# Patient Record
Sex: Female | Born: 1970 | Race: White | Hispanic: No | Marital: Single | Smoking: Current every day smoker
Health system: Southern US, Community
[De-identification: ages and names within clinical notes are randomized; demographics above are authoritative.]

## PROBLEM LIST (undated history)

## (undated) DIAGNOSIS — D649 Anemia, unspecified: Secondary | ICD-10-CM

## (undated) DIAGNOSIS — F1010 Alcohol abuse, uncomplicated: Secondary | ICD-10-CM

## (undated) DIAGNOSIS — F1020 Alcohol dependence, uncomplicated: Secondary | ICD-10-CM

## (undated) DIAGNOSIS — F32A Depression, unspecified: Secondary | ICD-10-CM

## (undated) DIAGNOSIS — F329 Major depressive disorder, single episode, unspecified: Secondary | ICD-10-CM

## (undated) DIAGNOSIS — R188 Other ascites: Secondary | ICD-10-CM

## (undated) DIAGNOSIS — F410 Panic disorder [episodic paroxysmal anxiety] without agoraphobia: Secondary | ICD-10-CM

## (undated) DIAGNOSIS — K7460 Unspecified cirrhosis of liver: Secondary | ICD-10-CM

## (undated) DIAGNOSIS — K759 Inflammatory liver disease, unspecified: Secondary | ICD-10-CM

## (undated) DIAGNOSIS — F419 Anxiety disorder, unspecified: Secondary | ICD-10-CM

## (undated) DIAGNOSIS — Z8489 Family history of other specified conditions: Secondary | ICD-10-CM

## (undated) DIAGNOSIS — K8309 Other cholangitis: Secondary | ICD-10-CM

## (undated) HISTORY — PX: OTHER SURGICAL HISTORY: SHX169

## (undated) HISTORY — PX: ERCP: SHX60

---

## 1998-09-15 ENCOUNTER — Other Ambulatory Visit: Admission: RE | Admit: 1998-09-15 | Discharge: 1998-09-15 | Payer: Self-pay | Admitting: Dentistry

## 1999-07-28 ENCOUNTER — Encounter: Admission: RE | Admit: 1999-07-28 | Discharge: 1999-07-28 | Payer: Self-pay | Admitting: Internal Medicine

## 1999-07-28 ENCOUNTER — Encounter: Payer: Self-pay | Admitting: Internal Medicine

## 2001-07-28 ENCOUNTER — Inpatient Hospital Stay (HOSPITAL_COMMUNITY): Admission: EM | Admit: 2001-07-28 | Discharge: 2001-08-03 | Payer: Self-pay | Admitting: Emergency Medicine

## 2001-07-28 ENCOUNTER — Encounter: Payer: Self-pay | Admitting: Gastroenterology

## 2002-09-29 ENCOUNTER — Encounter: Payer: Self-pay | Admitting: *Deleted

## 2002-09-29 ENCOUNTER — Inpatient Hospital Stay (HOSPITAL_COMMUNITY): Admission: EM | Admit: 2002-09-29 | Discharge: 2002-10-04 | Payer: Self-pay | Admitting: *Deleted

## 2002-09-30 ENCOUNTER — Encounter: Payer: Self-pay | Admitting: *Deleted

## 2003-02-14 ENCOUNTER — Ambulatory Visit (HOSPITAL_COMMUNITY): Admission: RE | Admit: 2003-02-14 | Discharge: 2003-02-14 | Payer: Self-pay | Admitting: Obstetrics & Gynecology

## 2003-06-26 ENCOUNTER — Emergency Department (HOSPITAL_COMMUNITY): Admission: EM | Admit: 2003-06-26 | Discharge: 2003-06-27 | Payer: Self-pay | Admitting: Emergency Medicine

## 2003-07-09 ENCOUNTER — Emergency Department (HOSPITAL_COMMUNITY): Admission: EM | Admit: 2003-07-09 | Discharge: 2003-07-09 | Payer: Self-pay | Admitting: Family Medicine

## 2003-08-17 ENCOUNTER — Inpatient Hospital Stay (HOSPITAL_COMMUNITY): Admission: EM | Admit: 2003-08-17 | Discharge: 2003-08-19 | Payer: Self-pay | Admitting: Psychiatry

## 2003-10-07 ENCOUNTER — Inpatient Hospital Stay (HOSPITAL_COMMUNITY): Admission: RE | Admit: 2003-10-07 | Discharge: 2003-10-13 | Payer: Self-pay | Admitting: Psychiatry

## 2003-10-07 ENCOUNTER — Ambulatory Visit: Payer: Self-pay | Admitting: Psychiatry

## 2004-04-08 ENCOUNTER — Ambulatory Visit (HOSPITAL_COMMUNITY): Admission: RE | Admit: 2004-04-08 | Discharge: 2004-04-08 | Payer: Self-pay | Admitting: Internal Medicine

## 2004-06-07 ENCOUNTER — Ambulatory Visit (HOSPITAL_COMMUNITY): Admission: RE | Admit: 2004-06-07 | Discharge: 2004-06-07 | Payer: Self-pay | Admitting: *Deleted

## 2004-08-07 ENCOUNTER — Inpatient Hospital Stay (HOSPITAL_COMMUNITY): Admission: RE | Admit: 2004-08-07 | Discharge: 2004-08-12 | Payer: Self-pay | Admitting: Psychiatry

## 2004-08-07 ENCOUNTER — Ambulatory Visit: Payer: Self-pay | Admitting: Psychiatry

## 2004-10-01 ENCOUNTER — Ambulatory Visit (HOSPITAL_COMMUNITY): Admission: RE | Admit: 2004-10-01 | Discharge: 2004-10-01 | Payer: Self-pay | Admitting: *Deleted

## 2004-10-01 ENCOUNTER — Encounter (INDEPENDENT_AMBULATORY_CARE_PROVIDER_SITE_OTHER): Payer: Self-pay | Admitting: *Deleted

## 2005-06-08 ENCOUNTER — Emergency Department (HOSPITAL_COMMUNITY): Admission: EM | Admit: 2005-06-08 | Discharge: 2005-06-08 | Payer: Self-pay | Admitting: Emergency Medicine

## 2006-08-29 ENCOUNTER — Encounter: Admission: RE | Admit: 2006-08-29 | Discharge: 2006-08-29 | Payer: Self-pay | Admitting: Gastroenterology

## 2006-10-14 ENCOUNTER — Emergency Department (HOSPITAL_COMMUNITY): Admission: EM | Admit: 2006-10-14 | Discharge: 2006-10-14 | Payer: Self-pay | Admitting: Emergency Medicine

## 2007-06-04 ENCOUNTER — Emergency Department (HOSPITAL_COMMUNITY): Admission: EM | Admit: 2007-06-04 | Discharge: 2007-06-04 | Payer: Self-pay | Admitting: Emergency Medicine

## 2007-08-04 ENCOUNTER — Emergency Department (HOSPITAL_COMMUNITY): Admission: EM | Admit: 2007-08-04 | Discharge: 2007-08-05 | Payer: Self-pay | Admitting: Emergency Medicine

## 2007-08-14 ENCOUNTER — Emergency Department (HOSPITAL_COMMUNITY): Admission: EM | Admit: 2007-08-14 | Discharge: 2007-08-14 | Payer: Self-pay | Admitting: Emergency Medicine

## 2008-08-02 ENCOUNTER — Emergency Department (HOSPITAL_COMMUNITY): Admission: EM | Admit: 2008-08-02 | Discharge: 2008-08-02 | Payer: Self-pay | Admitting: Emergency Medicine

## 2008-11-04 ENCOUNTER — Ambulatory Visit (HOSPITAL_COMMUNITY): Admission: RE | Admit: 2008-11-04 | Discharge: 2008-11-04 | Payer: Self-pay | Admitting: Obstetrics & Gynecology

## 2008-11-18 ENCOUNTER — Encounter: Admission: RE | Admit: 2008-11-18 | Discharge: 2008-11-18 | Payer: Self-pay | Admitting: Obstetrics & Gynecology

## 2009-03-18 ENCOUNTER — Encounter: Admission: RE | Admit: 2009-03-18 | Discharge: 2009-03-18 | Payer: Self-pay | Admitting: Gastroenterology

## 2009-11-17 ENCOUNTER — Encounter: Admission: RE | Admit: 2009-11-17 | Discharge: 2009-11-17 | Payer: Self-pay | Admitting: Obstetrics & Gynecology

## 2010-01-24 ENCOUNTER — Encounter: Payer: Self-pay | Admitting: Gastroenterology
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
## 2010-05-21 NOTE — H&P (Signed)
[facility]  Patient:    [name], [name] Visit Number: [id number] MRN: [id number]          Service Type: MED Location: [phone] Attending Physician:  Deneen Harts Dictated by:   Griffith Citron, M.D. Admit Date:  07/28/2001                           History and Physical  ADMISSION DIAGNOSES: 1. Acute alcoholic pancreatitis. 2. Acute alcoholic hepatitis. 3. Primary sclerosing cholangitis. 4. Crohns colitis. 5. Rib fracture.  HISTORY OF PRESENT ILLNESS:  The patient had been in her usual state of good health until five days ago.  While moving a mattress she slipped and fell back injuring her left posterior rib cage.  Seen at [facility] where she was diagnosed with posterior left rib cage fractures, and was placed on oxycodone.  This past week the patient consumed a large amount of alcohol daily for the past four days, in excess of 40 g per day.  Last night she was awakened from sleep with the onset of epigastric pain radiating to both upper quadrants, severe and intense in nature, quality was that of a pressure type discomfort, associated with nausea and vomiting of phlegm and bilious gastric juices. Denies hematemesis.  Associated diaphoresis, intermittent chills.  Moderate abdominal distention.  No bowel movements.  PAST MEDICAL HISTORY: 1. Primary sclerosing cholangitis, diagnosed 13 years ago with presentation of    jaundice.  Endoscopic retrograde cholangiopancreatography with    sphincterotomy in 1997.  Dominant left hepatic duct stricture.  Treated    with Actigall at small to moderate doses due to diarrhea precluding higher    doses. 2. Crohns colitis, cecal ulceration, remotely.  Clinically quiesent.  No    anti-inflammatory or other therapy directed at her irritable bowel disease.  REVIEW OF SYSTEMS:  Left flank pain related to recent trauma.  Last menstrual period yesterday.  SOCIAL HISTORY:  The patient is living  with mother and two sons ages 60 and 67. Broke off with her long established boyfriend in May.  Smokes just less than a pack per day.  Occasional marijuana use.  Sexually active.  Building control surveyor. Drinking approximately 20 to 30 g of alcohol per day.  More during the past week.  FAMILY HISTORY:  Significant only for father with alcoholism.  PHYSICAL EXAMINATION:  GENERAL:  An acutely ill-appearing young white female, alert and oriented, in moderate pain.  Dark complexion, deeply tanned.  VITAL SIGNS:  Stable.  The patient is afebrile.  HEENT:  Extraocular movements intact.  Pupils are equal, round and reactive to light.  Anicteric sclerae.  No pallor.  No oropharyngeal lesion without lesion of the lip, tongue, teeth, or gums.  NECK:  Supple without adenopathy or thyromegaly.  CHEST:  Clear, except for decreased breath sounds bilaterally at the bases. Ecchymoses over the left posterior rib cage with exquisite tenderness on direct palpation over the tenth, eleventh, and twelfth ribs in the posterior axillary line.  CARDIAC:  Regular rhythm, no gallop, no murmur.  ABDOMEN:  Mild distention, absent bowel sounds, marked upper abdominal tenderness with firmness.  No discrete mass.  No rebound, no guarding.  Hepar span is 16 cm to percussion, edges palpable at the level of the umbilicus approximately 10 cm below the right costal margin.  Splenomegaly with tip palpable 4 cm below the left anterior axillary line.  No additional masses. No caput, venous prominence,  or fluid wave detected.  RECTAL:  No perianal or ______ pathologies.  Stool is formed, heme negative, soft, brown.  EXTREMITIES:  No clubbing, cyanosis, or edema.  Multiple ecchymoses over both lower extremities.  NEUROLOGIC:  General tremulousness, no fetor.  No asterixis.  Alert, oriented x3.  Neurologic examination is otherwise nonfocal.  LABORATORY DATA:  CBC notable for mild leukocytosis at 11.2.  White blood cells  with slight left shift.  Hemoglobin 15.9, hematocrit 44.8, platelets 225,000.  Amylase 206 (27-131), lipase is pending.  Liver function tests showed a total bilirubin of 2.2, alkaline phosphatase 255, AST 182, ALT 136.  Abdominal ultrasound shows pancreatic edema, no ductal dilation.  No evidence of ascites.  Some fluid in the left pararenal space.  ASSESSMENT: 1. Acute alcoholic pancreatitis and acute alcoholic hepatitis.  This findings    are superimposed on the patients chronic liver disease with known primary    sclerosing cholangitis.  The patients alcohol consumption was here to for    unknown.  Her acute illness appears to be mild without significant ransom    criteria.  There is no evidence of hemodilution, hypocalcemia, marked    leukocytosis, no significant fluid imbalance. 2. Chronic alcohol abuse.  This will markedly accelerate her underlying    primary hepatopathy. 3. Rib fractures.  Acute.  RECOMMENDATIONS: 1. Intravenous fluid and electrolyte support. 2. Analgesia. 3. Antiemetics. 4. NG suction. 5. Monitor labs, fluid status. 6. Watch for DTs, alcohol withdrawal seizure. 7. Cover with intravenous Cipro because of underlying primary sclerosing    cholangitis. Dictated by:   Griffith Citron, M.D. Attending Physician:  Deneen Harts DD:  07/28/01 TD:  07/31/01 Job: [id number]

## 2010-05-21 NOTE — H&P (Signed)
NAMEGWENDOLA, [name] NO.:  [id number]   MEDICAL RECORD NO.:  [id number]                   PATIENT TYPE:  INP   LOCATION:  0104                                 FACILITY:  [facility]   PHYSICIAN:  Lorre Munroe., M.D.            DATE OF BIRTH:  [birth year]   DATE OF ADMISSION:  09/29/2002  DATE OF DISCHARGE:                                HISTORY & PHYSICAL   CHIEF COMPLAINT:  Abdominal pain.   HISTORY OF PRESENT ILLNESS:  This is a 40 year old white female who has had  a 24 hour history of band-like upper abdominal pain and back pain.  This  followed a binge of three days of drinking.  The patient was hospitalized  about a year ago for acute pancreatitis, thought to be due to an alcoholic  binge.  She has a history of alcoholic hepatitis.  She also has a history of  Crohn's disease and sclerosing cholangitis.  She has been jaundiced in the  past, but not recently.  She had been considered for a liver transplantation  in [place], is not on a list for a liver transplantation now.  The patient had  not been taking any medications for about nine months due to inability to  afford them.  She was supposed to be on iron and Actigall.  Evaluation at  the emergency department included a hepatobiliary scan which showed  nonvisualization of the gallbladder and hepatomegaly.  Ultrasound showed  distended gallbladder, slightly thick walled with some sludge, but no  stones.  There was no pericholecystic fluid.  The biliary ducts were normal.  The pancreas was not seen.  She had lipase which was elevated at 225.  Bilirubin was 1.7.  Liver tests were moderately elevated.  White count was  9.6, with a moderate left shift.  Labs are otherwise unremarkable.  She is  admitted for treatment of pancreatitis, probably alcoholic hepatitis, and  possible acute cholecystitis.   PAST MEDICAL HISTORY:  No operations.   MEDICATIONS:  No medicines at this time except for  ibuprofen.   ALLERGIES:  No known allergies. She has no hospitalizations except for  pancreatitis and pregnancies.   FAMILY HISTORY:  Unremarkable.   CHILDHOOD ILLNESSES:  Unremarkable.   PHYSICAL EXAMINATION:  GENERAL:  The patient is in no acute distress.  VITAL SIGNS:  Unremarkable as recorded by nurse.  MENTAL STATUS:  Normal, although she seems a bit forgetful.  HEENT:  Unremarkable.  CHEST:  Clear to auscultation.  HEART:  Heart rate is rapid, rhythm,it is regular, no murmur or gallop is  noted.  BREASTS:  Normal.  ABDOMEN:  It is slightly distended.  Nontender, except that the liver is  tender and massively enlarged.  The spleen is not palpable.  There is  moderate left upper quadrant tenderness without rebound tenderness.  Bowel  sounds are present.  RECTAL:  Not done.  PELVIC:  Vaginal exam also not done.  EXTREMITIES:  Normal, no edema present.  No deformity or cyanosis present.  NEUROLOGIC:  Normal.   IMPRESSION:  1. Acute pancreatitis probably due to alcohol use.  2. Hepatomegaly, probably alcoholic hepatitis.  3. Possible acute cholecystitis.  4. Sclerosing cholangitis.  5. Crohn's disease, quiescent.   PLAN:  Admission for bowel rest, pain medication, and gastroenterology  consultation and observation.                                               Lorre Munroe., M.D.    Jodi Marble  D:  09/29/2002  T:  09/29/2002  Job:  [id number]

## 2010-05-21 NOTE — Discharge Summary (Signed)
NAMEBRIHANA, [name] NO.:  [id number]   MEDICAL RECORD NO.:  [id number]          PATIENT TYPE:  IPS   LOCATION:  0307                          [facility]ACILITY:  [facility]   PHYSICIAN:  Jeanice Lim, M.D. DATE OF BIRTH:  November 11, 1970   DATE OF ADMISSION:  10/07/2003  DATE OF DISCHARGE:  10/13/2003                                 DISCHARGE SUMMARY   IDENTIFYING DATA:  This is a 40 year old single Caucasian female,  voluntarily admitted, presenting with a history of alcohol abuse, drinking  vodka 2 pints per week for the past 3 years, fired from her job due to  drinking because she had been missing work, drinking more than she had  admitted to anyone, talking to people that were not there.  Boyfriend very  concerned about these behaviors, gets irritable at times, waking up with the  shakes.  Admits to rapidly escalating tolerance to alcohol.  Past psych  history:  Second hospitalization at [facility], wants to get  herself into rehab and following up with AA.  History of withdrawal  difficulty.  Denies a history of seizures, positive history of blackouts.  Had been hospitalized at [facility] for delirium.  No primary care  physician identified.   ADMISSION MEDICATIONS:  Ursodiol 300 mg b.i.d. and Lipram-CR, unknown dose.   ALLERGIES:  No known drug allergies.   PHYSICAL EXAMINATION:  Within normal limits, neurologically nonfocal.   ROUTINE ADMISSION LABS:  Essentially within normal limits.  Potassium was  low and partially replaced while in the emergency room.  Urine drug screen  negative.   MENTAL STATUS EXAM:  Alert, oriented, cooperative, fair eye contact.  Speech  clear, rambling at times, affect constricted, thought process coherent, no  evidence of psychosis, cognitively intact.  Judgment and insight fair with  poor impulse control.   ADMISSION DIAGNOSES:   AXIS I:  1.  Alcohol dependence likely partial withdrawal syndrome at time of  presenting to the ER.  2.  Rule out mood disorder or substance-induced mood disorder.   AXIS II:  Deferred.   AXIS III:  Liver disease.   AXIS IV:  Problems with occupation, economics, and medical problems.   AXIS V:  35/60.   HOSPITAL COURSE:  The patient was admitted and ordered routine p.r.n.  medications, underwent further monitoring, and was encouraged to participate  in individual, group and milieu therapy.  The patient was placed on safety  checks, was placed on low-dose Librium detox protocol with no loading dose  given due to Ativan having been given in the ER.  The patient tolerated  detox and other medical medications were restarted.  The patient  participated in substance abuse therapy and supportive crisis intervention.  Lamictal was started due to history of mood lability, along with Celexa  targeting depressive symptoms and Lamictal stopped due to question of side  effects, and the patient denying any clear hypomania or mania.  The patient  wanted to clarify differences between [facility] and [facility].  Case manager helped  the patient to explain options.  The patient was  discharged in improved  condition.  Mood was more stable.  No acute withdrawal symptoms, no suicide  ideation, improved judgment and insight, reporting recognizing the severity  of her addiction, although at times making statements which appeared to  minimize the severity and questionable ambivalence regarding truly wanting  to become abstinent.  However the patient verbally reported motivation to  follow up with the after care plan, was given medication education and  discharged on:  1.  Celexa 10 mg q.a.m.  2.  ReVia 50 mg q.a.m.  3.  Acto Gel 300 mg b.i.d.  4.  Seroquel 25 mg 1/2 to 1 q.8 p.r.n. agitation and 1 or 2 at night p.r.n.      insomnia.   DISPOSITION:  The patient was to follow up with [facility], walk in Monday through  Friday 1-4, within 1 day of discharge.   DISCHARGE DIAGNOSES:   AXIS I:  1.   Alcohol dependence likely partial withdrawal syndrome at time of      presenting to the ER.  2.  Rule out mood disorder or substance-induced mood disorder.   AXIS II:  Deferred.   AXIS III:  Liver disease.   AXIS IV:  Problems with occupation, economics, and medical problems.   AXIS V:  Global assessment of function on discharge was 55.     Jame   JEM/MEDQ  D:  11/16/2003  T:  11/16/2003  Job:  [id number]

## 2010-05-21 NOTE — Consult Note (Signed)
[name], [name]                              ACCOUNT NO.:  [id number]   MEDICAL RECORD NO.:  [id number]                   PATIENT TYPE:  INP   LOCATION:  0466                                 FACILITY:  [facility]   PHYSICIAN:  Althea Grimmer. Luther Parody, M.D.            DATE OF BIRTH:  Nov 30, 1970   DATE OF CONSULTATION:  DATE OF DISCHARGE:                                   CONSULTATION   [name] is a 40 year old female with multiple gastrointestinal diagnoses  including Crohn's colitis, sclerosing cholangitis, and alcoholic hepatitis.  She presented to the emergency room complaining of band-like upper abdominal  pain somewhat more pronounced in the right upper quadrant.  She has had  episodes of mild pancreatitis and alcoholic hepatitis with delirium tremens  in the past.  She persists in drinking on a binge basis upwards to over a  pint a day of hard liquor.  On admission she had an ultrasonogram that  demonstrated hepatomegaly with fat infiltration and sludge in the  gallbladder.  A HIDA scan did not visualize the gallbladder but showed an  increased liver size and a patent common bile duct.  Her admission lipase  was 215 and has come down to 114 within 24 hours.  Her admission alkaline  phosphatase was 401 and has come down to 289.  SGOT has decreased from 118  to 84, SGPT from 73 to 49.  Her amylase is normal this morning.  The patient  has been treated in the past with Ultrase and ursodeoxycholic acid, neither  of which she is taking at present.  She says her Crohn's has been relatively  quiescent, with only occasional diarrhea and no gastrointestinal bleeding.  She reportedly was vomiting prior to admission but has not vomited today.   PAST MEDICAL HISTORY:  1. Pertinent for a history of deep vein thrombosis.  2. 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  Last ERCP     apparently in 1997.  3. Crohn's colitis diagnosed with colonoscopy in the remote past.   CURRENT  MEDICATIONS:  In the hospital include only morphine and Ativan and  Phenergan.   ALLERGIES:  None reported.   FAMILY HISTORY:  Noncontributory.   SOCIAL HISTORY:  She works as a Building control surveyor.  Is an active alcoholic.  Smokes one pack of cigarettes per day.  Has two children.  Is divorced.   REVIEW OF SYSTEMS:  GENERAL:  No weight loss or night sweats.  ENDOCRINE:  No history of diabetes or thyroid problems.  SKIN:  No rash or pruritus.  EYES:  No icterus or change in vision.  ENT:  No aphthous ulcers or chronic  sore throat.  RESPIRATORY:  No shortness of breath, cough, or wheezing.  CARDIAC:  No chest pain, palpitations, or history of valvular heart disease.  GASTROINTESTINAL:  As above.  GENITOURINARY:  No dysuria or hematuria,  though it  is noted that a urinalysis on admission is nitrite-positive.  Remainder of review of systems is negative.   PHYSICAL EXAM:  GENERAL:  Depressed-appearing adult female in no acute  distress.  VITAL SIGNS:  Afebrile, blood pressure 126/76, pulse is 93 and regular.  SKIN:  Tanned.  HEENT:  Eyes are questionably minimal icteric.  Oropharynx unremarkable.  NECK:  Supple.  Without thyromegaly.  There is no cervical or inguinal  adenopathy.  CHEST:  Chest sounds clear.  HEART:  Heart sounds regular rate and rhythm.  Without murmurs, rubs, or  gallops.  ABDOMEN:  Soft, with diffuse, especially epigastric and right-sided,  tenderness.  Liver edge is smooth, and there is massive hepatomegaly.  I do  not appreciate a spleen.  RECTAL:  Not performed.  EXTREMITIES:  Without cyanosis, clubbing, edema, or rash.   IMPRESSION:  A 40 year old female with difficult constellation of  gastrointestinal problems including fairly quiescent Crohn's colitis,  previously diagnosed sclerosing cholangitis, and active alcoholic hepatitis.  I think the latter accounts for most of her pain and doubt that she has  acute cholecystitis at present despite the HIDA  findings.   PLAN:  CT scan will be checked today.  She will be placed on Ativan protocol  and alcoholic counseling will be requested.  Consideration will be given to  restarting ursodeoxycholic acid and possibly pancreatic enzymes when she can  tolerate p.o. better.  I am not certain that we need to proceed with repeat  ERCP at this time but would like to see where her liver function tests level  out when she is off alcohol.  Please see the orders.  The patient will be  transferred to my service.                                               Althea Grimmer. Luther Parody, M.D.    PJS/MEDQ  D:  09/30/2002  T:  09/30/2002  Job:  [id number]
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
## 2010-05-21 NOTE — Discharge Summary (Signed)
NAMEJOSHLYN, [name] [name] [name]                              ACCOUNT NO.:  [id number]   MEDICAL RECORD NO.:  [id number]                   PATIENT TYPE:  INP   LOCATION:  0466                                 [facility]ACILITY:  [facility]   PHYSICIAN:  Althea Grimmer. Luther Parody, M.D.            DATE OF BIRTH:  1970-01-21   DATE OF ADMISSION:  09/29/2002  DATE OF DISCHARGE:  10/04/2002                                 DISCHARGE SUMMARY   DISCHARGE DIAGNOSES:  1. Alcoholic hepatitis.  2. Alcoholic pancreatitis.  3. Delirium tremens.  4. 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  5. History of Crohn's colitis diagnosed in the remote past.  6. History of a deep venous thrombosis.  7. History of rib fractures.   HISTORY OF PRESENT ILLNESS:  [name] [name] [name] is a 40 year old female with the above  listed gastrointestinal diagnoses. She presented to the emergency room  complaining of bandlike upper abdominal pain more pronounced on the right  upper quadrant. She has had episodes of mild pancreatitis and alcoholic  hepatitis with delirium tremens in the past. She persists in binge drinking  to over a pink of hard liquor a day. She had not had alcohol for a few days  prior to admission and her alcohol level was nondetectable.   PERTINENT FINDINGS:  On admission, an ultrasonogram demonstrated  hepatomegaly with fatty infiltrating and sludge in the gallbladder. HIDA  scan did not visualize the gallbladder but did show a common bile duct that  was patent. Admission lipase was 215, admission alkaline phosphatase 401,  SGOT 118, SGPT 73. The patient reportedly has lost her job recently and her  health insurance and is not seeing any physician. She has discontinued her  ursodeoxycholic acid as well as her pancreatic enzyme supplementation the  latter of which she is taking for unclear reasons. She only describes  occasional diarrhea. She has had some vomiting on admission but has had no  hematemesis or melena.  For the remainder of the admission history, please  see the admission note.   PHYSICAL EXAMINATION:  VITAL SIGNS:  The patient was afebrile. Blood  pressure 126/76, pulse 93 and regular.  HEENT:  Eyes were minimally icteric.  ABDOMEN:  Soft with diffuse especially epigastric and right sided  tenderness. The liver edge was smooth but massively enlarged especially the  left lobe. No spleen could be appreciated.   HOSPITAL COURSE:  Several days after admission, the patient did indeed  progress to alcohol withdrawal delirium and shakes. She was placed on an  Ativan protocol tapering and improved rapidly. CT scan of the abdomen  demonstrated a markedly enlarged liver. There was air in the biliary tree  likely from prior sphincterotomy. There was no definite stones or sludge  seen. The pancreas was not significantly enlarged but the duct was mildly  dilated and there was stranding of peripancreatic fat around the tail of  the  pancreas. Chest x-ray demonstrated calcified granulomas but no active  disease. The patient gradually improved, blood and urine cultures were  negative. On September 30, she was eating a full diet; however, she  persisted in leaving the hospital to smoke and actually went to [facility]  by car for dinner the night before discharge. Following this meal, she did  have a small amount of vomiting but on the morning of discharge, she denied  abdominal pain or vomiting though she felt mildly nauseous.   DISCHARGE LABORATORIES:  White blood count 8.6, hemoglobin 12.2, MCV 111,  platelet count 121. Electrolytes were within normal limits, BUN 1,  creatinine 0.6. Total bilirubin 1.9, alkaline phosphatase 205, SGOT 58, SGPT  32, albumin 2.7. Lipase 30, amylase 74. Alfa fetoprotein was 6.1. Urinalysis  had been positive for nitrite prior to treatment with ciprofloxacin but the  urine culture was negative.   DISCHARGE MEDICATIONS:  1. Ativan 1 mg q.h.s. p.r.n. only 5 were  given.  2. Urso 250 mg q.i.d.  3. Reglan 10 mg before meals and at bedtime p.r.n. nausea.   CONDITION ON DISCHARGE:  Improved.   FOLLOW UP:  I have advised the patient to obtain a primary care physician  with admitting privileges at [facility]. I have advised her to seek  alcohol abstinence counseling, return to my office for followup in two  weeks.                                               Althea Grimmer. Luther Parody, M.D.    PJS/MEDQ  D:  10/04/2002  T:  10/05/2002  Job:  [id number]

## 2010-05-21 NOTE — Discharge Summary (Signed)
[name], [name]                              ACCOUNT NO.:  [id number]   MEDICAL RECORD NO.:  [id number]                   PATIENT TYPE:  INP   LOCATION:  0469                                 FACILITY:  [facility]   PHYSICIAN:  Griffith Citron, M.D.             DATE OF BIRTH:  1970-06-03   DATE OF ADMISSION:  07/28/2001  DATE OF DISCHARGE:  08/03/2001                                 DISCHARGE SUMMARY   DISCHARGE DIAGNOSES:  1. Acute alcoholic pancreatitis.  2. Acute alcoholic hepatitis.  3. Primary sclerosing cholangitis.  4. Crohn's colitis.   HOSPITAL COURSE:  The patient presented with symptoms of acute abdominal  pain, nausea, and vomiting.  Evaluated in the emergency room where she was  found to have acute alcoholic pancreatitis based on physical exam with  epigastric fullness, tenderness, absent bowel sounds.   Abdominal ultrasound revealed an edematous pancreas and laboratory confirmed  elevated amylase of 206, lipase 801.  In addition with elevated liver  enzymes including AST 182, ALT 136, alkaline phosphatase 2.5, total  bilirubin 2.2. The patient was admitted for IV hydration, close observation,  NG nasogastric decompression.  She was also admitted to observe for acute  alcohol withdrawal syndrome.   The patient did experience delirium tremens beginning on the third hospital  day, July 30, 2001.  This lasted for approximately 72 hours. The patient had  a low grade fever, low grade leukocytosis for which she was covered with  Cipro and especially with an underlying diagnosis of primary sclerosing  cholangitis.  Her abnormal liver enzymes were not significantly different  from her baseline elevations due to her PSC.  Cipro is discontinued on the  third hospital day after blood cultures returned negative.  Labs had all  resolved and are showing a tendency back toward baseline at the time of  discharge.  Diet was gradually advanced.  Requirements for the narcotics  also  gradually tapered.  The patient was also placed on alcohol  detoxification regimen including Ativan coverage.   At the time of discharge the patient was instructed on a low fat diet  supplemented with pancreatic enzyme replacement.  She is to remain on her  Urso 250 mg qid.  In addition, she is instructed and we are helping to  arrange intensive outpatient alcohol rehabilitation through [facility].  Finally, the patient is instructed to return to  my office in one week for follow-up.  She has also been instructed to  establish with a primary care physician with hospital admitting privileges  for future assistance should the need arise.   DISCHARGE MEDICAL REGIMEN:  1. Urso 250 1 P.O. qid  2. Ultrase 20 2-4 tablets P.O. with meals.  Number of tablets depending on     fat content of meal.  3. Establish an alcohol rehabilitation program.  4. Establish with primary care physician with  admitting privileges to [facility].  5. Office visit at one week to see me in follow-up.  The patient is to     remain out of work until that time.                                               Griffith Citron, M.D.   [name]  D:  08/02/2001  T:  08/08/2001  Job:  [id number]

## 2010-05-21 NOTE — Consult Note (Signed)
   NAMEJASA, DUNDON                              ACCOUNT NO.:  [id number]   MEDICAL RECORD NO.:  [id number]                   PATIENT TYPE:  INP   LOCATION:  0466                                 FACILITY:  [facility]   PHYSICIAN:  Etter Sjogren, M.D.                  DATE OF BIRTH:  29-Apr-1970   DATE OF CONSULTATION:  09/30/2002  DATE OF DISCHARGE:                                   CONSULTATION   REQUESTING PHYSICIAN:  Dwyane Luo. Fischer, M.D.   CHIEF COMPLAINT:  Extravasation injury.   HISTORY OF PRESENT ILLNESS:  A 40 year old woman with an IV contrast  extravasation injury earlier today.  Left upper extremity, upper arm.  No  complaints.   PAST MEDICAL HISTORY:  1. Deep vein thrombosis.  2. Sclerosing cholangitis.  3. Crohn's colitis.  Otherwise negative.   PHYSICAL EXAMINATION:  The extremity does have some edema in the left upper  extremity.  There is no wound.  There is no inflammation.  There is no  cellulitis.  Radial pulse is 2+.   ASSESSMENT:  There is no wound present.  Nothing to add from a plastic  surgery standpoint.   Would recommend:  1. Extremity elevation.  2. Change the IV site which is still in that same arm.  3. Watch for compartment syndrome.  4. If compartment syndrome develops call the hand surgery team for release.                                               Etter Sjogren, M.D.    DB/MEDQ  D:  09/30/2002  T:  09/30/2002  Job:  [id number]   cc:   Dwyane Luo. Fischer, M.D.  [address] [place]  [place]  [place]  [address]  Fax: [phone]
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
## 2010-05-21 NOTE — Op Note (Signed)
[name], [name]                  ACCOUNT NO.:  [id number]   MEDICAL RECORD NO.:  [id number]          PATIENT TYPE:  AMB   LOCATION:  ENDO                         FACILITY:  [facility]   PHYSICIAN:  Althea Grimmer. Santogade, M.D.DATE OF BIRTH:  01-Feb-1970   DATE OF PROCEDURE:  10/01/2004  DATE OF DISCHARGE:                                 OPERATIVE REPORT   PROCEDURES:  Video upper endoscopy and video colonoscopy.   INDICATIONS:  A 40 year old female with history of alcoholism and reported  ulcerative colitis and sclerosing cholangitis.  Indications for procedures  are rule out varices, survey for colorectal neoplasia, and status of  ulcerative colitis.   PREPARATION:  The patient is NPO since midnight, having taken MiraLax and  the Gator-Aid prep.   PROCEDURE #1:  Video upper endoscopy.   PREPROCEDURE SEDATION:  The patient received 50 mg of Demerol, 5 mg of  Versed and 25 mg of Phenergan IV.  In addition, her throat was anesthetized  with Cetacaine spray.   PROCEDURE:  The Olympus video upper endoscope was inserted via the mouth and  advanced easily through the upper esophageal sphincter.  Intubation was then  carried out to the descending duodenum.  The patient was somewhat combative,  but the procedure was accomplished without problem.  The esophagus appeared  normal.  There were no signs of varices.  A normal Z-line is shown in image  #1.  Intubation was then carried out to the duodenal bulb which was normal  and seen in image #2, and the descending duodenum was normal in image #3.  On withdrawal, the scope was retroflexed, and the GE junction appeared  normal.  In the image #4, no fundic varices were seen.  At the conclusion of  this procedure, the patient's position was reversed for procedure number 2,  video colonoscopy.  She received an additional 4 mg of Versed and 40 mg of  Demerol.  The Olympus video colonoscope was inserted via the rectum and  advanced through a suboptimally  prepped colon to the cecum.  Abundant  suctioning and irrigation was carried out to clean the mucosa.  The cecum  was reached and the terminal ileum briefly entered.  In image #2 is seen the  cecum.  In image #3 is the terminal ileum.  Both appeared essentially  normal.  There was no sign of active ileitis or colitis.  Random biopsies  were taken from each segment of the colon, from a normal ascending colon,  normal transverse colon, and from the descending colon.  Between 70 and 40  cm, there appeared to be burned out colitis with multiple pseudopolyps.  Several of these and other mucosal areas were sampled.  Separate biopsies  were taken from a normal-appearing sigmoid and rectum.  The retroflexed view  of the rectum is seen in image #4.  The patient tolerated the procedure  well.  Pulse, blood pressure, and oximetry testing remained stable  throughout.  She will be observed in recovery for 1 hour and discharged to  floor with a benign abdomen.   IMPRESSION:  1.  Normal upper endoscopy with no signs of portal hypertensive gastropathy      or varices.  2.  Apparently quiescent, burned out colitis with areas of pseudopolyposis      of the descending colon.  Multiple biopsies were done.   PLAN:  The patient will be advised of histology and follow up.  I would like  to see her in the office in 3 weeks.      Althea Grimmer. Luther Parody, M.D.  Electronically Signed     PJS/MEDQ  D:  10/01/2004  T:  10/01/2004  Job:  [id number]   cc:   Fleet Contras, M.D.  Fax: [phone]

## 2010-10-19 ENCOUNTER — Other Ambulatory Visit: Payer: Self-pay | Admitting: Obstetrics & Gynecology

## 2010-10-19 DIAGNOSIS — Z1231 Encounter for screening mammogram for malignant neoplasm of breast: Secondary | ICD-10-CM

## 2010-11-23 ENCOUNTER — Ambulatory Visit
Admission: RE | Admit: 2010-11-23 | Discharge: 2010-11-23 | Disposition: A | Discharge status: Home / Self Care | Payer: Medicaid Other | Source: Ambulatory Visit | Attending: Obstetrics & Gynecology | Admitting: Obstetrics & Gynecology

## 2010-11-23 DIAGNOSIS — Z1231 Encounter for screening mammogram for malignant neoplasm of breast: Secondary | ICD-10-CM

## 2011-12-15 ENCOUNTER — Emergency Department (HOSPITAL_COMMUNITY)
Admission: EM | Admit: 2011-12-15 | Discharge: 2011-12-15 | Disposition: A | Discharge status: Home / Self Care | Payer: Self-pay | Attending: Emergency Medicine | Admitting: Emergency Medicine

## 2011-12-15 ENCOUNTER — Encounter (HOSPITAL_COMMUNITY): Payer: Self-pay | Admitting: *Deleted

## 2011-12-15 DIAGNOSIS — F10929 Alcohol use, unspecified with intoxication, unspecified: Secondary | ICD-10-CM

## 2011-12-15 DIAGNOSIS — Z8719 Personal history of other diseases of the digestive system: Secondary | ICD-10-CM | POA: Insufficient documentation

## 2011-12-15 DIAGNOSIS — K920 Hematemesis: Secondary | ICD-10-CM | POA: Insufficient documentation

## 2011-12-15 DIAGNOSIS — F17200 Nicotine dependence, unspecified, uncomplicated: Secondary | ICD-10-CM | POA: Insufficient documentation

## 2011-12-15 DIAGNOSIS — R748 Abnormal levels of other serum enzymes: Secondary | ICD-10-CM | POA: Insufficient documentation

## 2011-12-15 DIAGNOSIS — F10229 Alcohol dependence with intoxication, unspecified: Secondary | ICD-10-CM | POA: Insufficient documentation

## 2011-12-15 DIAGNOSIS — Z8659 Personal history of other mental and behavioral disorders: Secondary | ICD-10-CM | POA: Insufficient documentation

## 2011-12-15 HISTORY — DX: Other cholangitis: K83.09

## 2011-12-15 HISTORY — DX: Anxiety disorder, unspecified: F41.9

## 2011-12-15 HISTORY — DX: Panic disorder (episodic paroxysmal anxiety): F41.0

## 2011-12-15 HISTORY — DX: Alcohol abuse, uncomplicated: F10.10

## 2011-12-15 LAB — CBC WITH DIFFERENTIAL/PLATELET
Eosinophils Absolute: 0.1 10*3/uL (ref 0.0–0.7)
Eosinophils Relative: 2 % (ref 0–5)
HCT: 40.3 % (ref 36.0–46.0)
Lymphocytes Relative: 29 % (ref 12–46)
Lymphs Abs: 1.5 10*3/uL (ref 0.7–4.0)
MCH: 29.1 pg (ref 26.0–34.0)
MCV: 86.9 fL (ref 78.0–100.0)
Monocytes Absolute: 0.6 10*3/uL (ref 0.1–1.0)
Monocytes Relative: 11 % (ref 3–12)
RBC: 4.64 MIL/uL (ref 3.87–5.11)
WBC: 5.3 10*3/uL (ref 4.0–10.5)

## 2011-12-15 LAB — COMPREHENSIVE METABOLIC PANEL
ALT: 29 U/L (ref 0–35)
BUN: 6 mg/dL (ref 6–23)
CO2: 24 mEq/L (ref 19–32)
Calcium: 9.1 mg/dL (ref 8.4–10.5)
Creatinine, Ser: 0.53 mg/dL (ref 0.50–1.10)
GFR calc Af Amer: >90 mL/min (ref >90)
GFR calc non Af Amer: >90 mL/min (ref >90)
Glucose, Bld: 95 mg/dL (ref 70–99)
Total Protein: 7.6 g/dL (ref 6.0–8.3)

## 2011-12-15 LAB — LIPASE, BLOOD: Lipase: 15 U/L (ref 11–59)

## 2011-12-15 MED ORDER — SODIUM CHLORIDE 0.9 % IV BOLUS (SEPSIS)
1000.0000 mL | Freq: Once | INTRAVENOUS | Status: AC
  Administered 2011-12-15: 1000 mL via INTRAVENOUS

## 2011-12-15 MED ORDER — ONDANSETRON HCL 4 MG/2ML IJ SOLN
4.0000 mg | Freq: Once | INTRAMUSCULAR | Status: AC
  Administered 2011-12-15: 4 mg via INTRAVENOUS
  Filled 2011-12-15: qty 2

## 2011-12-15 MED ORDER — METOCLOPRAMIDE HCL 5 MG/ML IJ SOLN
10.0000 mg | Freq: Once | INTRAMUSCULAR | Status: AC
  Administered 2011-12-15: 10 mg via INTRAVENOUS
  Filled 2011-12-15: qty 2

## 2011-12-15 NOTE — ED Provider Notes (Signed)
History     CSN: [id number]  Arrival date & time 12/15/11  0865   First MD Initiated Contact with Patient 12/15/11 [phone]      Chief Complaint  Patient presents with  . Nausea  . Hematemesis  . Alcohol Intoxication    (Consider location/radiation/quality/duration/timing/severity/associated sxs/prior treatment) HPI... level V caveat secondary to alcohol intoxication.  Nursing notes reviewed.  Patient admits to drinking a large amount of alcohol last night.  Brief episode of hematemesis.  No black stool.  Severity is mild.  No other complaints  Past Medical History  Diagnosis Date  . Sclerosing cholangitis   . Panic attacks   . Anxiety   . Alcohol abuse     Past Surgical History  Procedure Date  . None     History reviewed. No pertinent family history.  History  Substance Use Topics  . Smoking status: Current Every Day Smoker -- 1.0 packs/day  . Smokeless tobacco: Not on file  . Alcohol Use: Yes     Comment: Pt drinks vodka/liquor every weekend, denies daily drinking    OB History    Grav Para Term Preterm Abortions TAB SAB Ect Mult Living                  Review of Systems  All other systems reviewed and are negative.    Allergies  Review of patient's allergies indicates no known allergies.  Home Medications   Current Outpatient Rx  Name  Route  Sig  Dispense  Refill  . NAPROXEN SODIUM 220 MG PO TABS   Oral   Take 440 mg by mouth 2 (two) times daily as needed. For pain.         Marland Kitchen URSODIOL 300 MG PO CAPS   Oral   Take 300 mg by mouth 2 (two) times daily.           BP 119/68  Pulse 96  Temp 98 F (36.7 C) (Oral)  Resp 18  SpO2 100%  LMP 12/01/2011  Physical Exam  Nursing note and vitals reviewed. Constitutional: She is oriented to person, place, and time. She appears well-developed and well-nourished.       Patient is speaking rationally  HENT:  Head: Normocephalic and atraumatic.  Eyes: Conjunctivae normal and EOM are normal. Pupils  are equal, round, and reactive to light.  Neck: Normal range of motion. Neck supple.  Cardiovascular: Normal rate, regular rhythm and normal heart sounds.   Pulmonary/Chest: Effort normal and breath sounds normal.  Abdominal: Soft. Bowel sounds are normal.  Musculoskeletal: Normal range of motion.  Neurological: She is alert and oriented to person, place, and time.  Skin: Skin is warm and dry.  Psychiatric: She has a normal mood and affect.    ED Course  Procedures (including critical care time)  Labs Reviewed  CBC WITH DIFFERENTIAL - Abnormal; Notable for the following:    RDW 17.4 (*)     Basophils Relative 2 (*)     All other components within normal limits  COMPREHENSIVE METABOLIC PANEL - Abnormal; Notable for the following:    AST 60 (*)     Alkaline Phosphatase 224 (*)     All other components within normal limits  ETHANOL - Abnormal; Notable for the following:    Alcohol, Ethyl (B) 358 (*)     All other components within normal limits  LIPASE, BLOOD   No results found.   1. Alcohol intoxication   2. Elevated liver enzymes  MDM  Advised to seek help via AA.   Has right home with significant other        Donnetta Hutching, MD 12/15/11 1038

## 2011-12-15 NOTE — ED Notes (Signed)
MD at bedside. 

## 2011-12-15 NOTE — ED Notes (Signed)
Pt says she is dry,

## 2011-12-15 NOTE — ED Notes (Signed)
Attempted to collect urine sample again, patient said no, she does not want to because we will probably use it against her. Doctor Adriana Simas notified.
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
## 2011-12-15 NOTE — ED Notes (Signed)
Pt requesting to go home, Dr. Adriana Simas aware, will monitor.

## 2011-12-15 NOTE — ED Notes (Signed)
This nurse called lab to ensure received blood samples, was told that testing was in process, will monitor.

## 2012-05-21 ENCOUNTER — Other Ambulatory Visit: Payer: Self-pay | Admitting: Gastroenterology

## 2012-05-21 DIAGNOSIS — K8309 Other cholangitis: Secondary | ICD-10-CM

## 2012-05-29 ENCOUNTER — Other Ambulatory Visit: Payer: Medicaid Other

## 2012-06-03 ENCOUNTER — Other Ambulatory Visit: Payer: Medicaid Other

## 2012-06-06 ENCOUNTER — Ambulatory Visit
Admission: RE | Admit: 2012-06-06 | Discharge: 2012-06-06 | Disposition: A | Discharge status: Home / Self Care | Payer: Medicaid Other | Source: Ambulatory Visit | Attending: Gastroenterology | Admitting: Gastroenterology

## 2012-06-06 DIAGNOSIS — K8309 Other cholangitis: Secondary | ICD-10-CM

## 2012-06-06 MED ORDER — GADOBENATE DIMEGLUMINE 529 MG/ML IV SOLN
13.0000 mL | Freq: Once | INTRAVENOUS | Status: AC | PRN
  Administered 2012-06-06: 13 mL via INTRAVENOUS

## 2012-06-28 ENCOUNTER — Encounter (HOSPITAL_COMMUNITY): Payer: Self-pay | Admitting: Emergency Medicine

## 2012-06-28 ENCOUNTER — Emergency Department (HOSPITAL_COMMUNITY): Payer: Medicaid Other

## 2012-06-28 ENCOUNTER — Inpatient Hospital Stay (HOSPITAL_COMMUNITY)
Admission: EM | Admit: 2012-06-28 | Discharge: 2012-07-05 | DRG: 432 | Disposition: A | Discharge status: Home / Self Care | Payer: Medicaid Other | Attending: Internal Medicine | Admitting: Internal Medicine

## 2012-06-28 DIAGNOSIS — R7401 Elevation of levels of liver transaminase levels: Secondary | ICD-10-CM

## 2012-06-28 DIAGNOSIS — E871 Hypo-osmolality and hyponatremia: Secondary | ICD-10-CM

## 2012-06-28 DIAGNOSIS — K7011 Alcoholic hepatitis with ascites: Secondary | ICD-10-CM

## 2012-06-28 DIAGNOSIS — D539 Nutritional anemia, unspecified: Secondary | ICD-10-CM | POA: Diagnosis present

## 2012-06-28 DIAGNOSIS — Z8719 Personal history of other diseases of the digestive system: Secondary | ICD-10-CM

## 2012-06-28 DIAGNOSIS — K7010 Alcoholic hepatitis without ascites: Secondary | ICD-10-CM | POA: Diagnosis present

## 2012-06-28 DIAGNOSIS — K8301 Primary sclerosing cholangitis: Secondary | ICD-10-CM

## 2012-06-28 DIAGNOSIS — D696 Thrombocytopenia, unspecified: Secondary | ICD-10-CM

## 2012-06-28 DIAGNOSIS — F1010 Alcohol abuse, uncomplicated: Secondary | ICD-10-CM

## 2012-06-28 DIAGNOSIS — F1020 Alcohol dependence, uncomplicated: Secondary | ICD-10-CM | POA: Diagnosis present

## 2012-06-28 DIAGNOSIS — K7030 Alcoholic cirrhosis of liver without ascites: Principal | ICD-10-CM | POA: Diagnosis present

## 2012-06-28 DIAGNOSIS — R17 Unspecified jaundice: Secondary | ICD-10-CM

## 2012-06-28 DIAGNOSIS — R109 Unspecified abdominal pain: Secondary | ICD-10-CM

## 2012-06-28 DIAGNOSIS — I959 Hypotension, unspecified: Secondary | ICD-10-CM | POA: Diagnosis present

## 2012-06-28 DIAGNOSIS — D649 Anemia, unspecified: Secondary | ICD-10-CM | POA: Diagnosis present

## 2012-06-28 DIAGNOSIS — K5190 Ulcerative colitis, unspecified, without complications: Secondary | ICD-10-CM | POA: Diagnosis present

## 2012-06-28 DIAGNOSIS — F17200 Nicotine dependence, unspecified, uncomplicated: Secondary | ICD-10-CM | POA: Diagnosis present

## 2012-06-28 DIAGNOSIS — K759 Inflammatory liver disease, unspecified: Secondary | ICD-10-CM

## 2012-06-28 DIAGNOSIS — K8309 Other cholangitis: Secondary | ICD-10-CM | POA: Diagnosis present

## 2012-06-28 DIAGNOSIS — R188 Other ascites: Secondary | ICD-10-CM

## 2012-06-28 DIAGNOSIS — K652 Spontaneous bacterial peritonitis: Secondary | ICD-10-CM | POA: Diagnosis present

## 2012-06-28 LAB — CBC WITH DIFFERENTIAL/PLATELET
Basophils Absolute: 0 10*3/uL (ref 0.0–0.1)
Eosinophils Absolute: 0.1 10*3/uL (ref 0.0–0.7)
Eosinophils Relative: 2 % (ref 0–5)
Lymphocytes Relative: 22 % (ref 12–46)
MCV: 92.8 fL (ref 78.0–100.0)
Neutrophils Relative %: 69 % (ref 43–77)
Platelets: 50 10*3/uL — ABNORMAL LOW (ref 150–400)
RDW: 20 % — ABNORMAL HIGH (ref 11.5–15.5)
WBC: 5.4 10*3/uL (ref 4.0–10.5)

## 2012-06-28 LAB — PROTIME-INR
INR: 1.43 (ref 0.00–1.49)
Prothrombin Time: 17.1 seconds — ABNORMAL HIGH (ref 11.6–15.2)

## 2012-06-28 LAB — LIPASE, BLOOD: Lipase: 22 U/L (ref 11–59)

## 2012-06-28 LAB — ETHANOL: Alcohol, Ethyl (B): 307 mg/dL — ABNORMAL HIGH (ref 0–11)

## 2012-06-28 LAB — COMPREHENSIVE METABOLIC PANEL
AST: 222 U/L — ABNORMAL HIGH (ref 0–37)
Albumin: 2.5 g/dL — ABNORMAL LOW (ref 3.5–5.2)
Calcium: 7.7 mg/dL — ABNORMAL LOW (ref 8.4–10.5)
Chloride: 95 mEq/L — ABNORMAL LOW (ref 96–112)
Creatinine, Ser: 0.39 mg/dL — ABNORMAL LOW (ref 0.50–1.10)
Sodium: 135 mEq/L (ref 135–145)

## 2012-06-28 LAB — AMMONIA: Ammonia: 72 umol/L — ABNORMAL HIGH (ref 11–60)

## 2012-06-28 MED ORDER — MORPHINE SULFATE 4 MG/ML IJ SOLN
4.0000 mg | Freq: Once | INTRAMUSCULAR | Status: AC
  Administered 2012-06-28: 4 mg via INTRAVENOUS
  Filled 2012-06-28: qty 1

## 2012-06-28 MED ORDER — HYDROMORPHONE HCL PF 1 MG/ML IJ SOLN: 1.0000 mg | INTRAMUSCULAR | Status: DC | PRN

## 2012-06-28 MED ORDER — DEXTROSE 5 % IV SOLN
1.0000 g | Freq: Once | INTRAVENOUS | Status: AC
  Administered 2012-06-28: 1 g via INTRAVENOUS
  Filled 2012-06-28: qty 10

## 2012-06-28 NOTE — ED Notes (Signed)
Attempted to collect urine, pt unable to void.  

## 2012-06-28 NOTE — ED Provider Notes (Signed)
History    CSN: [id number] Arrival date & time 06/28/12  2105  First MD Initiated Contact with Patient 06/28/12 2108     Chief Complaint  Patient presents with  . 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 Patient states she has a history of sclerosing cholangitis diagnosed at the age of 44. Since then she has been drinking alcohol last drink was today. For the past 4 days she has been having increased dull achy abdominal pain with increased abdominal swelling. Pain is throughout the abdomen, persistent, worsening when she sits up. Abdominal swelling is new for her. She reports increased shortness of breath due to the swelling. She also noticed swelling to both of her legs. She also noticed yellowing of the eyes which she has had in the past. She denies fever, chills, headache, exertional chest pain, dysuria, numbness or weakness. No specific treatment tried. Patient is a smoker and smoked one pack a day. Patient has never had ascites in the past. Patient had an MRI of the abdomen 2 weeks ago by Dr. Bosie Clos, and states they did not find any obstructive etiology concerning her sclerosing cholangitis. Past Medical History  Diagnosis Date  . Sclerosing cholangitis   . Panic attacks   . Anxiety   . Alcohol abuse    Past Surgical History  Procedure Laterality Date  . None     No family history on file. History  Substance Use Topics  . Smoking status: Current Every Day Smoker -- 1.00 packs/day  . Smokeless tobacco: Not on file  . Alcohol Use: Yes     Comment: Pt drinks vodka/liquor every weekend, denies daily drinking   OB History   Grav Para Term Preterm Abortions TAB SAB Ect Mult Living                 Review of Systems  All other systems reviewed and are  negative.    Allergies  Review of patient's allergies indicates no known allergies.  Home Medications   Current Outpatient Rx  Name  Route  Sig  Dispense  Refill  . naproxen sodium (ANAPROX) 220 MG tablet   Oral   Take 440 mg by mouth 2 (two) times daily as needed. For pain.         . ursodiol (ACTIGALL) 300 MG capsule   Oral   Take 300 mg by mouth 2 (two) times daily.          BP 112/71  Pulse 92  Temp(Src) 98.3 F (36.8 C) (Oral)  Resp 20  SpO2 97% Physical Exam  Nursing note and vitals reviewed. Constitutional: She is oriented to person, place, and time. No distress.  Ill-appearing female who appears older than stated age.  HENT:  Head: Normocephalic and atraumatic.  Oral mucosa moist  Eyes: Scleral icterus is present.  Scleral icterus  Neck: Neck supple.  Positive JVD  Cardiovascular:  Heart tachycardic without murmurs, rubs, or gallop noted  Pulmonary/Chest:  Decreased breath sounds without rales, rhonchi, or wheezes heard  Abdominal:  Abdomen is significantly distended with caput medusa and several ecchymotic changes. Enlarged liver border on palpation. Abdomen is moderately tender diffusely. No guarding, no rebound tenderness.  Musculoskeletal: She exhibits edema (2-3+ pitting edema to bilateral lower extremities with diffuse erythema, blanchable to both lower extremities. Strong palpable dorsalis pedis.).  Neurological: She is alert and oriented to  person, place, and time.  No asterixis  Psychiatric: She has a normal mood and affect.    ED Course  Procedures (including critical care time)   Date: 06/28/2012  Rate: 91  Rhythm: normal sinus rhythm  QRS Axis: normal  Intervals: QT prolonged  ST/T Wave abnormalities: normal  Conduction Disutrbances:none  Narrative Interpretation: new prolonged QT  Old EKG Reviewed: changes noted    9:39 PM Pt with liver cirrhosis with new onset of ascites causing her discomfort.  She is afebrile, VSS.  Work up  initiated.  Is A&O x3, no evidence hepatic encephalopathy.  Care discussed with attending.  Pain medication given.    10:57 PM Hemoglobin is 9.7. Platelet is 50. Potassium is 3.0. Chloride 95. AST is 222, alkaline phosphatase is 546, total bili 6.5. Ammonia level is 72. Alcohol level is 307. Head CT is unremarkable. Blood culture ordered.  10:59 PM I have consult with triad hospitalist, Dr. Toniann Fail, who recommend patient to have an abdominal and pelvis CT scan with contrast. He also recommend starting patient on Rocephin 1 g via IV. Patient made n.p.o. patient to be admitted to a limited bed, team 10, under his care.  Pt aware of plan.  Pt will need therapeutic paracentesis once admitted.  Patient has prolonged QT on EKG. Will avoid using Zofran as it can cause QT prolongation.  Labs Reviewed  CBC WITH DIFFERENTIAL - Abnormal; Notable for the following:    RBC 3.05 (*)    Hemoglobin 9.7 (*)    HCT 28.3 (*)    RDW 20.0 (*)    Platelets 50 (*)    All other components within normal limits  COMPREHENSIVE METABOLIC PANEL - Abnormal; Notable for the following:    Potassium 3.0 (*)    Chloride 95 (*)    BUN 4 (*)    Creatinine, Ser 0.39 (*)    Calcium 7.7 (*)    Albumin 2.5 (*)    AST 222 (*)    Alkaline Phosphatase 546 (*)    Total Bilirubin 6.5 (*)    All other components within normal limits  PROTIME-INR - Abnormal; Notable for the following:    Prothrombin Time 17.1 (*)    All other components within normal limits  AMMONIA - Abnormal; Notable for the following:    Ammonia 72 (*)    All other components within normal limits  ETHANOL - Abnormal; Notable for the following:    Alcohol, Ethyl (B) 307 (*)    All other components within normal limits  CULTURE, BLOOD (ROUTINE X 2)  CULTURE, BLOOD (ROUTINE X 2)  LIPASE, BLOOD  URINALYSIS, ROUTINE W REFLEX MICROSCOPIC   Ct Head Wo Contrast  06/28/2012   *RADIOLOGY REPORT*  Clinical Data: Blurred vision  CT HEAD WITHOUT CONTRAST   Technique:  Contiguous axial images were obtained from the base of the skull through the vertex without contrast.  Comparison: CT 10/14/2006  Findings: Mild atrophy for age.  Ventricle size is normal. Negative for acute or chronic infarct.  Negative for hemorrhage or mass lesion.  No skull lesion identified.  Air-fluid level in the sphenoid sinus.  IMPRESSION: Mild atrophy.  No acute abnormality.   Original Report Authenticated By: Janeece Riggers, M.D.   1. Abdominal pain   2. Alcoholic hepatitis with ascites   3. Transaminitis   4. Alcohol abuse     MDM  BP 108/63  Pulse 93  Temp(Src) 98.3 F (36.8 C) (Oral)  Resp 11  SpO2 98%  I  have reviewed nursing notes and vital signs. I personally reviewed the imaging tests through PACS system  I reviewed available ER/hospitalization records thought the EMR      Fayrene Helper, [place] 06/28/12 2305

## 2012-06-28 NOTE — ED Provider Notes (Signed)
Medical screening examination/treatment/procedure(s) were conducted as a shared visit with non-physician practitioner(s) and myself.  I personally evaluated the patient during the encounter.  Patient with history of liver disease presents with progressively worsening abdominal pain and distention. Patient has evidence of ascites which she has never had before. There is concern for possible SBP, but the pain is likely secondary simply to the accumulation of the ascites. Patient covered empirically with antibiotics and will be admitted to the hospital.  Gilda Crease, MD 06/28/12 [phone]

## 2012-06-28 NOTE — ED Notes (Signed)
Pt. Has hx. Of cirrhosis of liver. EMS called to house for abdominal distention that has increased since this past weekend.

## 2012-06-29 ENCOUNTER — Inpatient Hospital Stay (HOSPITAL_COMMUNITY): Payer: Medicaid Other

## 2012-06-29 ENCOUNTER — Encounter (HOSPITAL_COMMUNITY): Payer: Self-pay | Admitting: Internal Medicine

## 2012-06-29 DIAGNOSIS — Z8719 Personal history of other diseases of the digestive system: Secondary | ICD-10-CM

## 2012-06-29 DIAGNOSIS — R188 Other ascites: Secondary | ICD-10-CM | POA: Diagnosis present

## 2012-06-29 DIAGNOSIS — K8301 Primary sclerosing cholangitis: Secondary | ICD-10-CM | POA: Diagnosis present

## 2012-06-29 DIAGNOSIS — D696 Thrombocytopenia, unspecified: Secondary | ICD-10-CM | POA: Diagnosis present

## 2012-06-29 DIAGNOSIS — R109 Unspecified abdominal pain: Secondary | ICD-10-CM

## 2012-06-29 DIAGNOSIS — R609 Edema, unspecified: Secondary | ICD-10-CM

## 2012-06-29 DIAGNOSIS — R17 Unspecified jaundice: Secondary | ICD-10-CM | POA: Diagnosis present

## 2012-06-29 DIAGNOSIS — K759 Inflammatory liver disease, unspecified: Secondary | ICD-10-CM | POA: Diagnosis present

## 2012-06-29 LAB — BASIC METABOLIC PANEL
BUN: 4 mg/dL — ABNORMAL LOW (ref 6–23)
CO2: 28 mEq/L (ref 19–32)
Calcium: 7.9 mg/dL — ABNORMAL LOW (ref 8.4–10.5)
Creatinine, Ser: 0.39 mg/dL — ABNORMAL LOW (ref 0.50–1.10)
Glucose, Bld: 78 mg/dL (ref 70–99)
Sodium: 135 mEq/L (ref 135–145)

## 2012-06-29 LAB — URINALYSIS, ROUTINE W REFLEX MICROSCOPIC
Glucose, UA: NEGATIVE mg/dL
Hgb urine dipstick: NEGATIVE
Specific Gravity, Urine: 1.015 (ref 1.005–1.030)

## 2012-06-29 LAB — CBC
MCH: 31.6 pg (ref 26.0–34.0)
MCHC: 33 g/dL (ref 30.0–36.0)
MCV: 95.7 fL (ref 78.0–100.0)
Platelets: 53 10*3/uL — ABNORMAL LOW (ref 150–400)
RBC: 3.04 MIL/uL — ABNORMAL LOW (ref 3.87–5.11)

## 2012-06-29 LAB — GRAM STAIN

## 2012-06-29 LAB — URINE MICROSCOPIC-ADD ON

## 2012-06-29 LAB — GLUCOSE, SEROUS FLUID: Glucose, Fluid: 78 mg/dL

## 2012-06-29 LAB — LIPASE, BLOOD: Lipase: 17 U/L (ref 11–59)

## 2012-06-29 LAB — GLUCOSE, CAPILLARY
Glucose-Capillary: 77 mg/dL (ref 70–99)
Glucose-Capillary: 66 mg/dL — ABNORMAL LOW (ref 70–99)
Glucose-Capillary: 96 mg/dL (ref 70–99)

## 2012-06-29 LAB — BODY FLUID CELL COUNT WITH DIFFERENTIAL
Monocyte-Macrophage-Serous Fluid: 65 % (ref 50–90)
Total Nucleated Cell Count, Fluid: 124 cu mm (ref 0–1000)

## 2012-06-29 LAB — PROTEIN, BODY FLUID: Total protein, fluid: 1.3 g/dL

## 2012-06-29 LAB — AMMONIA: Ammonia: 96 umol/L — ABNORMAL HIGH (ref 11–60)

## 2012-06-29 LAB — HEPATIC FUNCTION PANEL
ALT: 28 U/L (ref 0–35)
AST: 220 U/L — ABNORMAL HIGH (ref 0–37)
Albumin: 2.5 g/dL — ABNORMAL LOW (ref 3.5–5.2)
Total Protein: 6.6 g/dL (ref 6.0–8.3)

## 2012-06-29 LAB — PROTIME-INR: INR: 1.48 (ref 0.00–1.49)

## 2012-06-29 MED ORDER — DEXTROSE 5 % IV SOLN
1.0000 g | Freq: Three times a day (TID) | INTRAVENOUS | Status: DC
  Administered 2012-06-29 – 2012-07-01 (×6): 1 g via INTRAVENOUS
  Filled 2012-06-29 (×8): qty 1

## 2012-06-29 MED ORDER — MORPHINE SULFATE 2 MG/ML IJ SOLN
1.0000 mg | INTRAMUSCULAR | Status: DC | PRN
  Administered 2012-06-29 – 2012-07-03 (×13): 1 mg via INTRAVENOUS
  Filled 2012-06-29 (×14): qty 1

## 2012-06-29 MED ORDER — LORAZEPAM 2 MG/ML IJ SOLN: 0.0000 mg | Freq: Two times a day (BID) | INTRAMUSCULAR | Status: DC

## 2012-06-29 MED ORDER — SODIUM CHLORIDE 0.9 % IJ SOLN
3.0000 mL | Freq: Two times a day (BID) | INTRAMUSCULAR | Status: DC
  Administered 2012-06-29 – 2012-07-04 (×11): 3 mL via INTRAVENOUS

## 2012-06-29 MED ORDER — ONDANSETRON HCL 4 MG PO TABS: 4.0000 mg | ORAL_TABLET | Freq: Four times a day (QID) | ORAL | Status: DC | PRN

## 2012-06-29 MED ORDER — NICOTINE 21 MG/24HR TD PT24
21.0000 mg | MEDICATED_PATCH | Freq: Every day | TRANSDERMAL | Status: DC
  Administered 2012-06-29 – 2012-07-02 (×4): 21 mg via TRANSDERMAL
  Filled 2012-06-29 (×5): qty 1

## 2012-06-29 MED ORDER — SODIUM CHLORIDE 0.9 % IJ SOLN
3.0000 mL | Freq: Two times a day (BID) | INTRAMUSCULAR | Status: DC
  Administered 2012-07-02 – 2012-07-03 (×2): 3 mL via INTRAVENOUS

## 2012-06-29 MED ORDER — IOHEXOL 300 MG/ML  SOLN
80.0000 mL | Freq: Once | INTRAMUSCULAR | Status: AC | PRN
  Administered 2012-06-29: 80 mL via INTRAVENOUS

## 2012-06-29 MED ORDER — POTASSIUM CHLORIDE CRYS ER 20 MEQ PO TBCR
40.0000 meq | EXTENDED_RELEASE_TABLET | Freq: Once | ORAL | Status: AC
  Administered 2012-06-29: 40 meq via ORAL
  Filled 2012-06-29: qty 2

## 2012-06-29 MED ORDER — FOLIC ACID 1 MG PO TABS
1.0000 mg | ORAL_TABLET | Freq: Every day | ORAL | Status: DC
  Administered 2012-06-29 – 2012-07-05 (×7): 1 mg via ORAL
  Filled 2012-06-29 (×8): qty 1

## 2012-06-29 MED ORDER — DEXTROSE 5 % IV SOLN
1.0000 g | INTRAVENOUS | Status: DC
  Filled 2012-06-29: qty 10

## 2012-06-29 MED ORDER — ADULT MULTIVITAMIN W/MINERALS CH
1.0000 | ORAL_TABLET | Freq: Every day | ORAL | Status: DC
  Administered 2012-06-29 – 2012-07-05 (×7): 1 via ORAL
  Filled 2012-06-29 (×8): qty 1

## 2012-06-29 MED ORDER — PNEUMOCOCCAL VAC POLYVALENT 25 MCG/0.5ML IJ INJ
0.5000 mL | INJECTION | INTRAMUSCULAR | Status: AC
  Filled 2012-06-29: qty 0.5

## 2012-06-29 MED ORDER — VITAMIN B-1 100 MG PO TABS
100.0000 mg | ORAL_TABLET | Freq: Every day | ORAL | Status: DC
  Administered 2012-06-29 – 2012-07-05 (×7): 100 mg via ORAL
  Filled 2012-06-29 (×8): qty 1

## 2012-06-29 MED ORDER — LORAZEPAM 2 MG/ML IJ SOLN: 0.0000 mg | Freq: Four times a day (QID) | INTRAMUSCULAR | Status: DC

## 2012-06-29 MED ORDER — THIAMINE HCL 100 MG/ML IJ SOLN
100.0000 mg | Freq: Every day | INTRAMUSCULAR | Status: DC
  Filled 2012-06-29 (×3): qty 1

## 2012-06-29 MED ORDER — LORAZEPAM 1 MG PO TABS: 1.0000 mg | ORAL_TABLET | Freq: Four times a day (QID) | ORAL | Status: DC | PRN

## 2012-06-29 MED ORDER — SORBITOL 70 % SOLN
15.0000 mL | Freq: Every day | Status: DC | PRN
  Administered 2012-07-01: 15 mL via ORAL
  Filled 2012-06-29 (×2): qty 30

## 2012-06-29 MED ORDER — ONDANSETRON HCL 4 MG/2ML IJ SOLN
4.0000 mg | Freq: Four times a day (QID) | INTRAMUSCULAR | Status: DC | PRN
  Administered 2012-06-29 – 2012-06-30 (×2): 4 mg via INTRAVENOUS
  Filled 2012-06-29 (×2): qty 2

## 2012-06-29 MED ORDER — LORAZEPAM 2 MG/ML IJ SOLN: 1.0000 mg | Freq: Four times a day (QID) | INTRAMUSCULAR | Status: DC | PRN

## 2012-06-29 MED ORDER — URSODIOL 300 MG PO CAPS
300.0000 mg | ORAL_CAPSULE | Freq: Two times a day (BID) | ORAL | Status: DC
  Administered 2012-06-29 – 2012-07-05 (×13): 300 mg via ORAL
  Filled 2012-06-29 (×17): qty 1

## 2012-06-29 NOTE — Progress Notes (Signed)
*  PRELIMINARY RESULTS* Vascular Ultrasound Lower extremity venous duplex has been completed.  Preliminary findings: Bilaterally negative for DVT. Baker's cyst noted on left.    Farrel Demark, RDMS, RVT  06/29/2012, 12:11 PM

## 2012-06-29 NOTE — Procedures (Signed)
US guided diagnostic/therapeutic paracentesis performed yielding 860 cc's yellow fluid. A portion of the fluid was sent to the lab for preordered studies. No immediate complications.

## 2012-06-29 NOTE — Progress Notes (Signed)
4:23 PM I agree with HPI/GPe and A/P per Dr. Toniann Fail      Seen and briefly evaluated as heading straight to paracentesis She states she started developing abdominal swelling over the past 3-4 days with abdominal pain. She has not really drank heavily she claims past 2 years and has been in school and has six-year-old child She states she's had no fever no chills no nausea no vomiting-the pain seems to be relieved and she is a little bit anxious about having paracentesis performed. She denies any history of heart failure and is not sure why her legs started to swell.  Patient Active Problem List   Diagnosis Date Noted  . Abdominal pain 06/29/2012  . Ascites 06/29/2012  . Jaundice 06/29/2012  . Thrombocytopenia 06/29/2012  . Hepatitis 06/29/2012  . 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 History of Crohn's disease 06/29/2012   Have consulted gastroenterology Dr. Evette Cristal- Await paracentesis results and further management-probably will need low doses of Aldactone; Lasix 40:1 ratio, minimize salt in diet, further workup outpatient. Patient's last CIWA score was 0. Will discontinue Ativan protocol to more morning if remains at 0  Pleas Koch, MD [facility] [phone]

## 2012-06-29 NOTE — Progress Notes (Signed)
UR COMPLETED  

## 2012-06-29 NOTE — Consult Note (Signed)
Subjective:   HPI  The patient is a 42 year old female who is a patient of Dr. Bosie Clos. She is followed for chronic colitis and primary sclerosing cholangitis. According to records that I reviewed them Dr. school or hematin ulcerative colitis but the patient states Crohn's. In any event she continues to drink alcohol despite the fact that she has primary sclerosing cholangitis. She presented to the hospital because of abdominal swelling which has occurred over the past couple of weeks. She was found to have significant ascites on CT scan. She also has noted increased swelling of her legs recently.  Review of Systems   Past Medical History  Diagnosis Date  . Sclerosing cholangitis   . Panic attacks   . Anxiety   . Alcohol abuse    Past Surgical History  Procedure Laterality Date  . None    . Ercp     History   Social History  . Marital Status: Single    Spouse Name: N/A    Number of Children: N/A  . Years of Education: N/A   Occupational History  . Not on file.   Social History Main Topics  . Smoking status: Current Every Day Smoker -- 1.00 packs/day  . Smokeless tobacco: Not on file  . Alcohol Use: Yes     Comment: Pt drinks vodka/liquor every weekend, denies daily drinking  . Drug Use: No  . Sexually Active: Yes    Birth Control/ Protection: IUD   Other Topics Concern  . Not on file   Social History Narrative  . No narrative on file   family history includes CAD in her father. Current facility-administered medications:cefoTAXime (CLAFORAN) 1 g in dextrose 5 % 50 mL IVPB, 1 g, Intravenous, Q8H, Eduard Clos, MD;  folic acid (FOLVITE) tablet 1 mg, 1 mg, Oral, Daily, Eduard Clos, MD, 1 mg at 06/29/12 1109;  LORazepam (ATIVAN) injection 0-4 mg, 0-4 mg, Intravenous, Q6H, Eduard Clos, MD;  Melene Muller ON 07/01/2012] LORazepam (ATIVAN) injection 0-4 mg, 0-4 mg, Intravenous, Q12H, Eduard Clos, MD LORazepam (ATIVAN) injection 1 mg, 1 mg, Intravenous,  Q6H PRN, Eduard Clos, MD;  LORazepam (ATIVAN) tablet 1 mg, 1 mg, Oral, Q6H PRN, Eduard Clos, MD;  morphine 2 MG/ML injection 1 mg, 1 mg, Intravenous, Q4H PRN, Eduard Clos, MD, 1 mg at 06/29/12 4098;  multivitamin with minerals tablet 1 tablet, 1 tablet, Oral, Daily, Eduard Clos, MD, 1 tablet at 06/29/12 1109 ondansetron (ZOFRAN) injection 4 mg, 4 mg, Intravenous, Q6H PRN, Eduard Clos, MD;  ondansetron (ZOFRAN) tablet 4 mg, 4 mg, Oral, Q6H PRN, Eduard Clos, MD;  Melene Muller ON 06/30/2012] pneumococcal 23 valent vaccine (PNU-IMMUNE) injection 0.5 mL, 0.5 mL, Intramuscular, Tomorrow-1000, Eduard Clos, MD;  sodium chloride 0.9 % injection 3 mL, 3 mL, Intravenous, Q12H, Eduard Clos, MD sodium chloride 0.9 % injection 3 mL, 3 mL, Intravenous, Q12H, Eduard Clos, MD, 3 mL at 06/29/12 1109;  thiamine (B-1) injection 100 mg, 100 mg, Intravenous, Daily, Eduard Clos, MD;  thiamine (VITAMIN B-1) tablet 100 mg, 100 mg, Oral, Daily, Eduard Clos, MD, 100 mg at 06/29/12 1109;  ursodiol (ACTIGALL) capsule 300 mg, 300 mg, Oral, BID, Eduard Clos, MD, 300 mg at 06/29/12 1111 No Known Allergies   Objective:     BP 102/67  Pulse 96  Temp(Src) 98.5 F (36.9 C) (Oral)  Resp 18  Ht 5\' 4"  (1.626 m)  Wt 71.5 kg (157 lb 10.1 oz)  BMI 27.04 kg/m2  SpO2 98%  She is alert and oriented  No acute distress  Heart regular rhythm  Lungs clear  Abdomen markedly distended with ascites, firm liver  Laboratory No components found with this basename: d1      Assessment:     #1. Long history of colitis  #2. Primary sclerosing cholangitis  #3. Persistent alcohol ingestion  #4. New-onset ascites  #5. Probable cirrhosis of liver      Plan:     Agree with current management. Agree with paracentesis to evaluate the fluid. After the paracentesis we need to try to control her fluid overload with diuretics with probably a  combination of Aldactone and Lasix. When she is stable I think she will need to followup as an outpatient with her primary gastroenterologist. Lab Results  Component Value Date   HGB 9.6* 06/29/2012   HGB 9.7* 06/28/2012   HGB 13.5 12/15/2011   HCT 29.1* 06/29/2012   HCT 28.3* 06/28/2012   HCT 40.3 12/15/2011   ALKPHOS 542* 06/29/2012   ALKPHOS 546* 06/28/2012   ALKPHOS 224* 12/15/2011   AST 220* 06/29/2012   AST 222* 06/28/2012   AST 60* 12/15/2011   ALT 28 06/29/2012   ALT 29 06/28/2012   ALT 29 12/15/2011

## 2012-06-29 NOTE — H&P (Signed)
[facility] History and Physical  [name] YQM:[id number] DOB: 1970/07/08 DOA: 06/28/2012  Referring physician: ER physician. PCP: No primary provider on file.  Specialists: Gastroenterologist Dr. Bosie Clos.  Chief Complaint: Abdominal pain and distention.  HPI: [name] is a 42 y.o. female with known history of primary sclerosing cholangitis and history of Crohn's disease and ongoing tobacco and alcohol abuse started noticing increasing swelling of her abdomen and lower extremities over the last one week. Patient's abdomen swelling rapidly progressed. Patient also has been having some abdominal discomfort due to the distention. Patient otherwise denies any nausea vomiting diarrhea fever chills. Patient denies having taken any new medications. Patient has had a recent MRI abdomen by patient's gastroenterologist to followup patient's primary sclerosing cholangitis done June 4 which did not show any acute findings and only minimal ascites. In addition labs show patient's alkaline phosphatase levels are increased and bilirubin also has increased with thrombocytopenia and anemia. Patient at this time has been admitted for further management. Patient denies any chest pain shortness of breath.  Review of Systems: As presented in the history of presenting illness, rest negative.  Past Medical History  Diagnosis Date  . Sclerosing cholangitis   . Panic attacks   . Anxiety   . Alcohol abuse    Past Surgical History  Procedure Laterality Date  . None    . Ercp     Social History:  reports that she has been smoking.  She does not have any smokeless tobacco history on file. She reports that  drinks alcohol. She reports that she does not use illicit drugs. Home. where does patient live-- Can do ADLs. Can patient participate in ADLs?  No Known Allergies  Family History  Problem Relation Age of Onset  . CAD Father       Prior to Admission medications   Medication Sig Start Date End  Date Taking? Authorizing Provider  naproxen sodium (ANAPROX) 220 MG tablet Take 440 mg by mouth 2 (two) times daily as needed. For pain.   Yes Historical Provider, MD  ursodiol (ACTIGALL) 300 MG capsule Take 300 mg by mouth 2 (two) times daily.   Yes Historical Provider, MD   Physical Exam: Filed Vitals:   06/28/12 2230 06/28/12 2245 06/28/12 2300 06/28/12 2315  BP: 113/65 108/63 96/54 103/64  Pulse: 93 93 96 96  Temp:      TempSrc:      Resp: 12 11 10 14   SpO2: 97% 98% 92% 96%     General:  Well-developed and nourished.  Eyes: Icteric. No pallor.  ENT: No discharge from the ears eyes nose mouth.  Neck: No mass felt.  Cardiovascular: S1-S2 heard.  Respiratory: No rhonchi or crepitations.  Abdomen: Distended abdomen with no discolorations. Nontender bowel sounds present. No guarding rigidity.  Skin: Appears pale. There is mild erythema in the lower extremities.  Musculoskeletal: Bilateral lower extremity edema extending up to the thigh.  Psychiatric: Appears normal.  Neurologic: Alert awake oriented to time place and person. Moves all extremities.  Labs on Admission:  Basic Metabolic Panel:  Recent Labs Lab 06/28/12 2155  NA 135  K 3.0*  CL 95*  CO2 29  GLUCOSE 88  BUN 4*  CREATININE 0.39*  CALCIUM 7.7*   Liver Function Tests:  Recent Labs Lab 06/28/12 2155  AST 222*  ALT 29  ALKPHOS 546*  BILITOT 6.5*  PROT 6.7  ALBUMIN 2.5*    Recent Labs Lab 06/28/12 2155  LIPASE 22    Recent  Labs Lab 06/28/12 2155  AMMONIA 72*   CBC:  Recent Labs Lab 06/28/12 2155  WBC 5.4  NEUTROABS 3.7  HGB 9.7*  HCT 28.3*  MCV 92.8  PLT 50*   Cardiac Enzymes: No results found for this basename: CKTOTAL, CKMB, CKMBINDEX, TROPONINI,  in the last 168 hours  BNP (last 3 results) No results found for this basename: PROBNP,  in the last 8760 hours CBG: No results found for this basename: GLUCAP,  in the last 168 hours  Radiological Exams on  Admission: Ct Head Wo Contrast  06/28/2012   *RADIOLOGY REPORT*  Clinical Data: Blurred vision  CT HEAD WITHOUT CONTRAST  Technique:  Contiguous axial images were obtained from the base of the skull through the vertex without contrast.  Comparison: CT 10/14/2006  Findings: Mild atrophy for age.  Ventricle size is normal. Negative for acute or chronic infarct.  Negative for hemorrhage or mass lesion.  No skull lesion identified.  Air-fluid level in the sphenoid sinus.  IMPRESSION: Mild atrophy.  No acute abnormality.   Original Report Authenticated By: Janeece Riggers, M.D.     Assessment/Plan Principal Problem:   Abdominal pain Active Problems:   Ascites   Jaundice   Thrombocytopenia   Hepatitis   Primary sclerosing cholangitis   History of Crohn's disease   1. Abdominal discomfort most likely secondary to large ascites - at this time patient has been placed empirically on ceftriaxone to cover for possible SBP. CT abdomen and pelvis is pending. Ultrasound guided paracentesis as been ordered for both therapeutic and diagnostic reasons. Check ascites fluid for cytology cell count Gram stain culture albumin protein. Consult GI in a.m. Patient on stable and creatinine stable will need diuretics given the new ascites and decompensated cirrhosis of the liver. 2. Jaundice - given patient's history of primary sclerosing cholangitis follow CT abdomen and pelvis for any obstruction. There was good also be from cirrhosis of liver and also alcoholism. Check direct and indirect bilirubin. 3. History of primary sclerosing cholangitis - per gastroenterologist. 4. Alcoholic hepatitis - patient's determinant score is only 15.7 from the initial labs. May recheck score again in a.m. 5. Anemia and thrombocytopenia - check stool for occult blood. Probably secondary to alcoholism. Closely follow CBC. 6. Alcoholism and tobacco abuse - cessation counseling requested. Patient placed on alcohol withdrawal protocol.  At  this time active patient n.p.o.    Code Status: Full code.  Family Communication: None.  Disposition Plan: Admit to inpatient.    Hector Taft N. [facility] Pager [phone].  If 7PM-7AM, please contact night-coverage [email or web address] Password [facility] 06/29/2012, 12:10 AM

## 2012-06-29 NOTE — Progress Notes (Signed)
[name] requests that we release medical information and assign first contact to her boyfriend, [name].  Visitor information sheet with password was signed by both Mr. Melvyn Neth and Ms. Winstead.  Contact page on the contacts list was updated to reflect the patients wishes.

## 2012-06-30 LAB — COMPREHENSIVE METABOLIC PANEL
CO2: 28 mEq/L (ref 19–32)
Calcium: 8.4 mg/dL (ref 8.4–10.5)
Creatinine, Ser: 0.41 mg/dL — ABNORMAL LOW (ref 0.50–1.10)
GFR calc Af Amer: >90 mL/min (ref >90)
GFR calc non Af Amer: >90 mL/min (ref >90)
Glucose, Bld: 82 mg/dL (ref 70–99)
Sodium: 133 mEq/L — ABNORMAL LOW (ref 135–145)
Total Protein: 6.5 g/dL (ref 6.0–8.3)

## 2012-06-30 LAB — GLUCOSE, CAPILLARY
Glucose-Capillary: 81 mg/dL (ref 70–99)
Glucose-Capillary: 104 mg/dL — ABNORMAL HIGH (ref 70–99)
Glucose-Capillary: 142 mg/dL — ABNORMAL HIGH (ref 70–99)

## 2012-06-30 LAB — AFP TUMOR MARKER: AFP-Tumor Marker: 6.3 ng/mL (ref 0.0–8.0)

## 2012-06-30 LAB — AMMONIA: Ammonia: 250 umol/L — ABNORMAL HIGH (ref 11–60)

## 2012-06-30 LAB — CBC
Platelets: 48 10*3/uL — ABNORMAL LOW (ref 150–400)
RBC: 3.04 MIL/uL — ABNORMAL LOW (ref 3.87–5.11)
RDW: 20.5 % — ABNORMAL HIGH (ref 11.5–15.5)
WBC: 5.2 10*3/uL (ref 4.0–10.5)

## 2012-06-30 LAB — PROTIME-INR: INR: 1.54 — ABNORMAL HIGH (ref 0.00–1.49)

## 2012-06-30 MED ORDER — FUROSEMIDE 40 MG PO TABS
40.0000 mg | ORAL_TABLET | Freq: Every day | ORAL | Status: DC
  Administered 2012-06-30 – 2012-07-01 (×2): 40 mg via ORAL
  Filled 2012-06-30 (×2): qty 1

## 2012-06-30 MED ORDER — POTASSIUM CHLORIDE 20 MEQ/15ML (10%) PO LIQD
40.0000 meq | Freq: Two times a day (BID) | ORAL | Status: DC
  Administered 2012-07-01 – 2012-07-05 (×9): 40 meq via ORAL
  Filled 2012-06-30 (×12): qty 30

## 2012-06-30 MED ORDER — POTASSIUM CHLORIDE CRYS ER 20 MEQ PO TBCR
40.0000 meq | EXTENDED_RELEASE_TABLET | Freq: Two times a day (BID) | ORAL | Status: DC
  Administered 2012-06-30: 40 meq via ORAL
  Filled 2012-06-30: qty 2

## 2012-06-30 MED ORDER — OXYCODONE HCL 5 MG PO TABS
5.0000 mg | ORAL_TABLET | ORAL | Status: DC | PRN
  Administered 2012-06-30 – 2012-07-04 (×12): 5 mg via ORAL
  Filled 2012-06-30 (×14): qty 1

## 2012-06-30 MED ORDER — SPIRONOLACTONE 100 MG PO TABS
100.0000 mg | ORAL_TABLET | Freq: Every day | ORAL | Status: DC
  Administered 2012-06-30 – 2012-07-01 (×2): 100 mg via ORAL
  Filled 2012-06-30 (×2): qty 1

## 2012-06-30 NOTE — Progress Notes (Signed)
PROGRESS NOTE  [name] RUE:[id number] DOB: [birth year] DOA: 06/28/2012 PCP: No primary provider on file.  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 Somewhat anxious. Relates to me that she was diagnosed with primary sclerosing cholangitis 1989 in [place] and referred to [place] a basket at that point it was not thought to require transplantation. She had a stent placed at some point in the right and left biliary ducts which removed sometime 2009 right after diagnosis and she's had interval swelling and jaundice with multiple ERCPs by her primary gastroenterologist who used to be Dr. Jarold Motto Dr. Marina Goodell She has been admitted in the past [place] and [place] in for similar issues but has never developed this type of discomfort in her abdomen nor swelling in her lower extremities   Objective    Interim History: See above  Telemetry: Mild sinus tachycardia  Objective: Filed Vitals:   06/29/12 2056 06/29/12 2106 06/30/12 0300 06/30/12 0525  BP: 110/60 106/65 100/60 101/64   Pulse: 90 98 92 105  Temp:  98.9 F (37.2 C)  99.3 F (37.4 C)  TempSrc:  Oral  Oral  Resp:  18  18  Height:      Weight:    68.5 kg (151 lb 0.2 oz)  SpO2:  92%  95%    Intake/Output Summary (Last 24 hours) at 06/30/12 0849 Last data filed at 06/30/12 0100  Gross per 24 hour  Intake    603 ml  Output    400 ml  Net    203 ml    Exam:  General: Alert anxious icteric female Cardiovascular: S1-S2 no murmur rub or Respiratory: Clinically clear Abdomen: Distended, Reduce it, hepatomegaly probably about 5-10 cm below right costal edge Skin yellow Neuro no asterixis  Data Reviewed: Basic Metabolic Panel:  Recent Labs Lab 06/28/12 2155 06/29/12 0535  NA 135 135  K 3.0* 3.0*  CL 95* 95*  CO2 29 28  GLUCOSE 88 78  BUN 4* 4*  CREATININE 0.39* 0.39*  CALCIUM 7.7* 7.9*   Liver Function Tests:  Recent Labs Lab 06/28/12 2155 06/29/12 0535  AST 222* 220*  ALT 29 28  ALKPHOS 546* 542*  BILITOT 6.5* 6.8*  PROT 6.7 6.6  ALBUMIN 2.5* 2.5*    Recent Labs Lab 06/28/12 2155 06/29/12 0535  LIPASE 22 17    Recent Labs Lab 06/28/12 2155 06/29/12 0535  AMMONIA 72* 96*   CBC:  Recent Labs Lab 06/28/12 2155 06/29/12 0535  WBC 5.4 5.6  NEUTROABS 3.7  --   HGB 9.7* 9.6*  HCT 28.3* 29.1*  MCV 92.8 95.7  PLT 50* 53*   Cardiac Enzymes: No results found  for this basename: CKTOTAL, CKMB, CKMBINDEX, TROPONINI,  in the last 168 hours BNP: No components found with this basename: POCBNP,  CBG:  Recent Labs Lab 06/29/12 1737 06/29/12 1830 06/29/12 1943 06/29/12 2202 06/30/12 0739  GLUCAP 66* 86 96 80 81    Recent Results (from the past 240 hour(s))  GRAM STAIN     Status: None   Collection Time    06/29/12  4:02 PM      Result Value Range Status   Specimen Description ASCITIC ABDOMEN FLUID   Final   Special Requests FLUID   Final   Gram Stain     Final   Value: MODERATE WBC PRESENT,BOTH PMN AND MONONUCLEAR     NO ORGANISMS SEEN   Report  Status 06/29/2012 FINAL   Final     Studies:              All Imaging reviewed and is as per above notation   Scheduled Meds: . cefoTAXime (CLAFORAN) IV  1 g Intravenous Q8H  . folic acid  1 mg Oral Daily  . LORazepam  0-4 mg Intravenous Q6H   Followed by  . [START ON 07/01/2012] LORazepam  0-4 mg Intravenous Q12H  . multivitamin with minerals  1 tablet Oral Daily  . nicotine  21 mg Transdermal Daily  . pneumococcal 23 valent vaccine  0.5 mL Intramuscular Tomorrow-1000  . sodium chloride  3 mL Intravenous Q12H  . sodium chloride  3 mL Intravenous Q12H  . thiamine  100 mg Oral Daily   Or  . thiamine  100 mg Intravenous Daily  . ursodiol  300 mg Oral BID   Continuous Infusions:    Assessment/Plan: 1. 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  Doesn't feel plethroic yet.  Added Lasix/ Aldactone 6/28.  Appreciate Gi further input-might need once again to tap abdomen tomorrow a.m./ 07/02/12 2. ? SBP-Although Ascitic fluid is non-suggestive, she has low grade fever-  Would consider 1 more day IV abx and potenttial d/c of Absx if cultures neg  3. Primary sclerosing cholangitis-potentially can have hepatocellular carcinoma/cholangiocarcinoma from this. Follow CA 19-9, alpha-fetoprotein. Will consider transition over to tertiary care center dependent on progress in hospital 4. Tobacco abuse-recommended to quit 5. Recreational alcohol use-recommended to absolutely desist from the same given new onset cirrhosis-her CIWA score is neg  Code Status: Full Family Communication: As per patient/boyfriend bedside Disposition Plan: Inpatient   Pleas Koch, MD  [facility] Pager [phone] 06/30/2012, 8:49 AM    LOS: 2 days

## 2012-06-30 NOTE — Progress Notes (Addendum)
[facility] Progress Note  Subjective: Patient states that her abdominal swelling and particularly her lower extremity edema or improving. She has no pain but had some nausea and vomiting last night  Objective: Vital signs in last 24 hours: Temp:  [98.5 F (36.9 C)-99.3 F (37.4 C)] 99 F (37.2 C) (06/28 1002) Pulse Rate:  [90-105] 101 (06/28 1002) Resp:  [18-20] 20 (06/28 1002) BP: (100-131)/(52-84) 131/84 mmHg (06/28 1002) SpO2:  [92 %-98 %] 98 % (06/28 1002) Weight:  [68.5 kg (151 lb 0.2 oz)] 68.5 kg (151 lb 0.2 oz) (06/28 0525) Weight change: -3 kg (-6 lb 9.8 oz)   PE: Moderately anicteric abdomen is distended nontender  Lab Results: Results for orders placed during the hospital encounter of 06/28/12 (from the past 24 hour(s))  GLUCOSE, CAPILLARY     Status: None   Collection Time    06/29/12 11:37 AM      Result Value Range   Glucose-Capillary 77  70 - 99 mg/dL  BODY FLUID CULTURE     Status: None   Collection Time    06/29/12  4:02 PM      Result Value Range   Specimen Description ASCITIC ABDOMEN FLUID     Special Requests FLUID     Gram Stain       Value: MODERATE WBC PRESENT,BOTH PMN AND MONONUCLEAR     NO ORGANISMS SEEN     Performed at [facility]   Culture PENDING     Report Status PENDING    ALBUMIN, FLUID     Status: None   Collection Time    06/29/12  4:02 PM      Result Value Range   Albumin, Fluid 0.7     Fluid Type-FALB ASCITIC    PROTEIN, BODY FLUID     Status: None   Collection Time    06/29/12  4:02 PM      Result Value Range   Total protein, fluid 1.3     Fluid Type-FTP ASCITIC    BODY FLUID CELL COUNT WITH DIFFERENTIAL     Status: Abnormal   Collection Time    06/29/12  4:02 PM      Result Value Range   Fluid Type-FCT ASCITIC     Color, Fluid YELLOW  YELLOW   Appearance, Fluid HAZY (*) CLEAR   WBC, Fluid 124  0 - 1000 cu mm   Neutrophil Count, Fluid 7  0 - 25 %   Lymphs, Fluid 28     Monocyte-Macrophage-Serous  Fluid 65  50 - 90 %  GRAM STAIN     Status: None   Collection Time    06/29/12  4:02 PM      Result Value Range   Specimen Description ASCITIC ABDOMEN FLUID     Special Requests FLUID     Gram Stain       Value: MODERATE WBC PRESENT,BOTH PMN AND MONONUCLEAR     NO ORGANISMS SEEN   Report Status 06/29/2012 FINAL    GLUCOSE, SEROUS FLUID     Status: None   Collection Time    06/29/12  4:02 PM      Result Value Range   Glucose, Fluid 78     Fluid Type-FGLU ASCITIC    GLUCOSE, CAPILLARY     Status: Abnormal   Collection Time    06/29/12  5:37 PM      Result Value Range   Glucose-Capillary 66 (*) 70 - 99 mg/dL  GLUCOSE,  CAPILLARY     Status: None   Collection Time    06/29/12  6:30 PM      Result Value Range   Glucose-Capillary 86  70 - 99 mg/dL   Comment 1 Notify RN    GLUCOSE, CAPILLARY     Status: None   Collection Time    06/29/12  7:43 PM      Result Value Range   Glucose-Capillary 96  70 - 99 mg/dL   Comment 1 Notify RN    GLUCOSE, CAPILLARY     Status: None   Collection Time    06/29/12 10:02 PM      Result Value Range   Glucose-Capillary 80  70 - 99 mg/dL   Comment 1 Notify RN    GLUCOSE, CAPILLARY     Status: None   Collection Time    06/30/12  7:39 AM      Result Value Range   Glucose-Capillary 81  70 - 99 mg/dL  COMPREHENSIVE METABOLIC PANEL     Status: Abnormal   Collection Time    06/30/12  8:30 AM      Result Value Range   Sodium 133 (*) 135 - 145 mEq/L   Potassium 3.8  3.5 - 5.1 mEq/L   Chloride 93 (*) 96 - 112 mEq/L   CO2 28  19 - 32 mEq/L   Glucose, Bld 82  70 - 99 mg/dL   BUN 8  6 - 23 mg/dL   Creatinine, Ser 2.13 (*) 0.50 - 1.10 mg/dL   Calcium 8.4  8.4 - 08.6 mg/dL   Total Protein 6.5  6.0 - 8.3 g/dL   Albumin 2.5 (*) 3.5 - 5.2 g/dL   AST 578 (*) 0 - 37 U/L   ALT 28  0 - 35 U/L   Alkaline Phosphatase 523 (*) 39 - 117 U/L   Total Bilirubin 9.1 (*) 0.3 - 1.2 mg/dL   GFR calc non Af Amer >90  >90 mL/min   GFR calc Af Amer >90  >90  mL/min  PROTIME-INR     Status: Abnormal   Collection Time    06/30/12  8:30 AM      Result Value Range   Prothrombin Time 18.1 (*) 11.6 - 15.2 seconds   INR 1.54 (*) 0.00 - 1.49  CBC     Status: Abnormal   Collection Time    06/30/12  8:30 AM      Result Value Range   WBC 5.2  4.0 - 10.5 K/uL   RBC 3.04 (*) 3.87 - 5.11 MIL/uL   Hemoglobin 9.7 (*) 12.0 - 15.0 g/dL   HCT 46.9 (*) 62.9 - 52.8 %   MCV 95.1  78.0 - 100.0 fL   MCH 31.9  26.0 - 34.0 pg   MCHC 33.6  30.0 - 36.0 g/dL   RDW 41.3 (*) 24.4 - 01.0 %   Platelets 48 (*) 150 - 400 K/uL    Studies/Results: Ct Head Wo Contrast  06/28/2012   *RADIOLOGY REPORT*  Clinical Data: Blurred vision  CT HEAD WITHOUT CONTRAST  Technique:  Contiguous axial images were obtained from the base of the skull through the vertex without contrast.  Comparison: CT 10/14/2006  Findings: Mild atrophy for age.  Ventricle size is normal. Negative for acute or chronic infarct.  Negative for hemorrhage or mass lesion.  No skull lesion identified.  Air-fluid level in the sphenoid sinus.  IMPRESSION: Mild atrophy.  No acute abnormality.   Original Report Authenticated  By: Janeece Riggers, M.D.   Ct Abdomen Pelvis W Contrast  06/29/2012   *RADIOLOGY REPORT*  Clinical Data: Abdominal pain the colon, swelling.  Nausea.  CT ABDOMEN AND PELVIS WITH CONTRAST  Technique:  Multidetector CT imaging of the abdomen and pelvis was performed following the standard protocol during bolus administration of intravenous contrast.  Contrast: 80mL OMNIPAQUE IOHEXOL 300 MG/ML  SOLN  Comparison: MRI 06/06/2012.  Findings: Lung bases are clear.  No effusions.  Heart is normal size. 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  Severe changes of cirrhosis with markedly nodular and shrunken liver surrounding perihepatic and perisplenic ascites.  Moderate ascites in the pelvis.  Gallbladder is mildly distended. Recanalization of the umbilical vein with periumbilical varices.Stomach, pancreas,  adrenals and kidneys are unremarkable. Spleen upper limits normal in size.  Scattered calcifications compatible with old granulomatous disease.  Small bowel is decompressed.  Colon is grossly unremarkable.  IUD is present in the uterus which is otherwise unremarkable.  No adnexal masses.  Aorta is normal caliber.  Scattered upper abdominal lymph nodes, likely related to liver disease.  No adenopathy.  IMPRESSION: Severe changes of cirrhosis.  Moderate associated ascites.  Large periumbilical varices.   Original Report Authenticated By: Charlett Nose, M.D.   US Paracentesis  06/29/2012   *RADIOLOGY REPORT*  Clinical Data: Primary sclerosing cholangitis, findings consistent with cirrhosis on CT scan, ascites.  Request is made for diagnostic and therapeutic paracentesis.  ULTRASOUND GUIDED DIAGNOSTIC AND THERAPEUTIC  PARACENTESIS  An ultrasound guided paracentesis was thoroughly discussed with the patient and questions answered.  The benefits, risks, alternatives and complications were also discussed.  The patient understands and wishes to proceed with the procedure.  Written consent was obtained.  Ultrasound was performed to localize and mark an adequate pocket of fluid in the right mid quadrant of the abdomen.  The area was then prepped and draped in the normal sterile fashion.  1% Lidocaine was used for local anesthesia.  Under ultrasound guidance a 19 gauge Yueh catheter was introduced.  Paracentesis was performed.  The catheter was removed and a dressing applied.  Complications:  none  Findings:  A total of approximately 860 cc's of yellow fluid was removed.  A fluid sample was sent for laboratory analysis.  IMPRESSION: Successful ultrasound guided diagnostic and therapeutic paracentesis yielding 860 cc's of ascites.  Read by: Jeananne Rama, P.A.-C   Original Report Authenticated By: Malachy Moan, M.D.      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 She was just started on Lasix and Aldactone. May need overall reassessment at a tertiary care center that does transplant at some point. Will check CA 19-9 as a possible marker for cholangiocarcinoma    Tyhir Schwan C 06/30/2012, 11:34 AM

## 2012-06-30 NOTE — Progress Notes (Signed)
Notified [name] Mcgregor, [name] that patient had paracentesis on 6/27 and puncture site is now draining a small amount of yellow drainage. Placed 4x4 dressing to site and told patient to call if more drainage noted. Patient stated understanding. Told [name] that patient's abdomen is still very swollen.  No new orders given. Will continue to monitor patient. Nelda Marseille, RN

## 2012-07-01 LAB — PROTIME-INR: Prothrombin Time: 19.4 seconds — ABNORMAL HIGH (ref 11.6–15.2)

## 2012-07-01 LAB — CBC
MCV: 98.3 fL (ref 78.0–100.0)
Platelets: 57 10*3/uL — ABNORMAL LOW (ref 150–400)
RBC: 3 MIL/uL — ABNORMAL LOW (ref 3.87–5.11)
WBC: 6.3 10*3/uL (ref 4.0–10.5)

## 2012-07-01 LAB — COMPREHENSIVE METABOLIC PANEL
Albumin: 2.5 g/dL — ABNORMAL LOW (ref 3.5–5.2)
BUN: 7 mg/dL (ref 6–23)
Chloride: 90 mEq/L — ABNORMAL LOW (ref 96–112)
Creatinine, Ser: 0.51 mg/dL (ref 0.50–1.10)
Total Bilirubin: 8.3 mg/dL — ABNORMAL HIGH (ref 0.3–1.2)

## 2012-07-01 MED ORDER — SPIRONOLACTONE 50 MG PO TABS
150.0000 mg | ORAL_TABLET | Freq: Every day | ORAL | Status: DC
  Administered 2012-07-02: 150 mg via ORAL
  Filled 2012-07-01: qty 1

## 2012-07-01 MED ORDER — FUROSEMIDE 40 MG PO TABS
40.0000 mg | ORAL_TABLET | Freq: Every day | ORAL | Status: DC
  Administered 2012-07-02: 40 mg via ORAL
  Filled 2012-07-01: qty 1

## 2012-07-01 MED ORDER — CIPROFLOXACIN HCL 500 MG PO TABS
500.0000 mg | ORAL_TABLET | Freq: Two times a day (BID) | ORAL | Status: DC
  Administered 2012-07-01 – 2012-07-05 (×8): 500 mg via ORAL
  Filled 2012-07-01 (×12): qty 1

## 2012-07-01 NOTE — Progress Notes (Signed)
Patient refused for bedalarm to be on, states that she will call for assistance when getting out of bed. Will continue to monitor patient. Nelda Marseille, RN

## 2012-07-01 NOTE — Progress Notes (Signed)
Pt refused to have bed alarm on.  Informed pt that it was used for her safety since she had a recent fall and was on narcotic medications.  Pt verbalized understanding, but still refused.

## 2012-07-01 NOTE — Progress Notes (Signed)
PROGRESS NOTE  [name] IDP:[id number] DOB: [birth year] DOA: 06/28/2012 PCP: No primary provider on file.  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 Somewhat anxious. Tachycardic per nursing, Doing well otherwise, the pain seems to be resolving. Is having some oozing from right side of abdomen and pain over enlarged liver no fevers or chills   Objective    Interim History: See above  Telemetry: Mild sinus tachycardia  Objective: Filed Vitals:   06/30/12 0525 06/30/12 1002 06/30/12 2108 07/01/12 0559  BP: 101/64 131/84 103/69 106/68  Pulse: 105 101 104 83  Temp: 99.3 F (37.4 C) 99 F (37.2 C) 99.5 F (37.5 C) 98.7 F (37.1 C)  TempSrc: Oral Oral Oral Oral  Resp: 18 20 20 18   Height:      Weight: 68.5 kg (151 lb 0.2 oz)   69 kg (152 lb 1.9 oz)  SpO2: 95% 98% 96% 97%    Intake/Output Summary (Last 24 hours) at 07/01/12 1717 Last data filed at 07/01/12 1459  Gross per 24 hour  Intake    290 ml  Output    200 ml  Net     90 ml    Exam:  General:  Alert anxious icteric female Cardiovascular: S1-S2 no murmur rub or Respiratory: Clinically clear Abdomen: Distended, Reduce it, hepatomegaly probably about 5-10 cm below right costal edge Skin yellow Neuro no asterixis  Data Reviewed: Basic Metabolic Panel:  Recent Labs Lab 06/28/12 2155 06/29/12 0535 06/30/12 0830 07/01/12 0550  NA 135 135 133* 128*  K 3.0* 3.0* 3.8 3.2*  CL 95* 95* 93* 90*  CO2 29 28 28  32  GLUCOSE 88 78 82 128*  BUN 4* 4* 8 7  CREATININE 0.39* 0.39* 0.41* 0.51  CALCIUM 7.7* 7.9* 8.4 8.7   Liver Function Tests:  Recent Labs Lab 06/28/12 2155 06/29/12 0535 06/30/12 0830 07/01/12 0550  AST 222* 220* 204* 169*  ALT 29 28 28 25   ALKPHOS 546* 542* 523* 498*  BILITOT 6.5* 6.8* 9.1* 8.3*  PROT 6.7 6.6 6.5 6.5  ALBUMIN 2.5* 2.5* 2.5* 2.5*    Recent Labs Lab 06/28/12 2155 06/29/12 0535  LIPASE 22 17    Recent Labs Lab 06/28/12 2155 06/29/12 0535 06/30/12 1000  AMMONIA 72* 96* 250*   CBC:  Recent Labs Lab 06/28/12 2155 06/29/12 0535 06/30/12 0830 07/01/12 0550  WBC 5.4 5.6 5.2 6.3  NEUTROABS 3.7  --   --   --   HGB 9.7* 9.6* 9.7* 9.7*  HCT 28.3* 29.1* 28.9* 29.5*  MCV 92.8 95.7 95.1 98.3  PLT 50* 53* 48* 57*   Cardiac Enzymes: No results found for this basename: CKTOTAL, CKMB,  CKMBINDEX, TROPONINI,  in the last 168 hours BNP: No components found with this basename: POCBNP,  CBG:  Recent Labs Lab 06/29/12 1943 06/29/12 2202 06/30/12 0739 06/30/12 1135 06/30/12 1714  GLUCAP 96 80 81 104* 142*    Recent Results (from the past 240 hour(s))  CULTURE, BLOOD (ROUTINE X 2)     Status: None   Collection Time    06/28/12 10:50 PM      Result Value Range Status   Specimen Description BLOOD ARM RIGHT   Final   Special Requests BOTTLES DRAWN AEROBIC AND ANAEROBIC 10CC   Final   Culture  Setup Time 06/29/2012 09:08   Final   Culture     Final   Value:        BLOOD CULTURE RECEIVED NO GROWTH TO DATE CULTURE WILL BE HELD FOR 5  DAYS BEFORE ISSUING A FINAL NEGATIVE REPORT   Report Status PENDING   Incomplete  CULTURE, BLOOD (ROUTINE X 2)     Status: None   Collection Time    06/28/12 11:00 PM      Result Value Range Status   Specimen Description BLOOD HAND RIGHT   Final   Special Requests BOTTLES DRAWN AEROBIC ONLY 10CC   Final   Culture  Setup Time 06/29/2012 09:09   Final   Culture     Final   Value:        BLOOD CULTURE RECEIVED NO GROWTH TO DATE CULTURE WILL BE HELD FOR 5 DAYS BEFORE ISSUING A FINAL NEGATIVE REPORT   Report Status PENDING   Incomplete  BODY FLUID CULTURE     Status: None   Collection Time    06/29/12  4:02 PM      Result Value Range Status   Specimen Description ASCITIC ABDOMEN FLUID   Final   Special Requests FLUID   Final   Gram Stain     Final   Value: MODERATE WBC PRESENT,BOTH PMN AND MONONUCLEAR     NO ORGANISMS SEEN     Performed at [facility]   Culture NO GROWTH 2 DAYS   Final   Report Status PENDING   Incomplete  GRAM STAIN     Status: None   Collection Time    06/29/12  4:02 PM      Result Value Range Status   Specimen Description ASCITIC ABDOMEN FLUID   Final   Special Requests FLUID   Final   Gram Stain     Final   Value: MODERATE WBC PRESENT,BOTH PMN AND MONONUCLEAR     NO ORGANISMS SEEN   Report Status 06/29/2012 FINAL   Final     Studies:              All Imaging reviewed and is as per above notation   Scheduled Meds: . cefoTAXime (CLAFORAN) IV  1 g Intravenous Q8H  . folic acid  1 mg Oral Daily  . furosemide  40 mg Oral Daily  . multivitamin with minerals  1 tablet Oral Daily  . nicotine  21 mg Transdermal Daily  . potassium chloride  40 mEq Oral BID  . sodium chloride  3 mL Intravenous Q12H  . sodium chloride  3 mL Intravenous Q12H  . spironolactone  100 mg Oral Daily  . thiamine  100 mg Oral Daily  . ursodiol  300 mg Oral BID   Continuous Infusions:    Assessment/Plan: 1. Decompensated new Cirrhosis/ascites in patient with  PSC-s/p paracentesis 6/27.  Doesn't feel plethroic yet.  Increased Lasix/Aldactone 40 mg/150 on 6/ 29.  Appreciate Gi further input-have asked patient who states to me has been set up to see someone at [facility] in hepatology to obtain and 4 number for me so I can make tertiary care appointment 2. ? SBP-Although Ascitic fluid is non-suggestive, she has low grade fever-will transition to ciprofloxacin by mouth which has relatively good penetrance for another 3-4 days 3. Hyponatremia-potentially secondary to Lasix, spironolactone combination. Consider NA/K urinary ratio if this persists. 4. Primary sclerosing cholangitis-potentially can have hepatocellular carcinoma/cholangiocarcinoma from this. CA 19-9, AFB are negative  5. Tobacco abuse-recommended to quit 6. Recreational alcohol use-recommended to absolutely desist from the same given new onset cirrhosis-her CIWA score is neg  Code Status: Full Family Communication: As per patient/boyfriend bedside Disposition Plan: Inpatient   Pleas Koch, MD  [facility] Pager [phone] 07/01/2012, 5:17 PM    LOS: 3 days

## 2012-07-01 NOTE — Progress Notes (Signed)
[facility] Progress Note  Subjective: Feels okay, developed leak at her paracentesis site yesterday evening which is still draining some.  Objective: Vital signs in last 24 hours: Temp:  [98.7 F (37.1 C)-99.5 F (37.5 C)] 98.7 F (37.1 C) (06/29 0559) Pulse Rate:  [83-104] 83 (06/29 0559) Resp:  [18-20] 18 (06/29 0559) BP: (103-131)/(68-84) 106/68 mmHg (06/29 0559) SpO2:  [96 %-98 %] 97 % (06/29 0559) Weight:  [69 kg (152 lb 1.9 oz)] 69 kg (152 lb 1.9 oz) (06/29 0559) Weight change: 0.5 kg (1 lb 1.6 oz)   PE: Moderate parous moderate abdominal distention  Lab Results: Results for orders placed during the hospital encounter of 06/28/12 (from the past 24 hour(s))  AMMONIA     Status: Abnormal   Collection Time    06/30/12 10:00 AM      Result Value Range   Ammonia 250 (*) 11 - 60 umol/L  GLUCOSE, CAPILLARY     Status: Abnormal   Collection Time    06/30/12 11:35 AM      Result Value Range   Glucose-Capillary 104 (*) 70 - 99 mg/dL  AFP TUMOR MARKER     Status: None   Collection Time    06/30/12 12:40 PM      Result Value Range   AFP-Tumor Marker 6.3  0.0 - 8.0 ng/mL  CANCER ANTIGEN 19-9     Status: Abnormal   Collection Time    06/30/12  2:07 PM      Result Value Range   CA 19-9 20.2 (*) <35.0 U/mL  GLUCOSE, CAPILLARY     Status: Abnormal   Collection Time    06/30/12  5:14 PM      Result Value Range   Glucose-Capillary 142 (*) 70 - 99 mg/dL  COMPREHENSIVE METABOLIC PANEL     Status: Abnormal   Collection Time    07/01/12  5:50 AM      Result Value Range   Sodium 128 (*) 135 - 145 mEq/L   Potassium 3.2 (*) 3.5 - 5.1 mEq/L   Chloride 90 (*) 96 - 112 mEq/L   CO2 32  19 - 32 mEq/L   Glucose, Bld 128 (*) 70 - 99 mg/dL   BUN 7  6 - 23 mg/dL   Creatinine, Ser 1.61  0.50 - 1.10 mg/dL   Calcium 8.7  8.4 - 09.6 mg/dL   Total Protein 6.5  6.0 - 8.3 g/dL   Albumin 2.5 (*) 3.5 - 5.2 g/dL   AST 045 (*) 0 - 37 U/L   ALT 25  0 - 35 U/L   Alkaline Phosphatase  498 (*) 39 - 117 U/L   Total Bilirubin 8.3 (*) 0.3 - 1.2 mg/dL   GFR calc non Af Amer >90  >90 mL/min   GFR calc Af Amer >90  >90 mL/min  CBC     Status: Abnormal   Collection Time    07/01/12  5:50 AM      Result Value Range   WBC 6.3  4.0 - 10.5 K/uL   RBC 3.00 (*) 3.87 - 5.11 MIL/uL   Hemoglobin 9.7 (*) 12.0 - 15.0 g/dL   HCT 40.9 (*) 81.1 - 91.4 %   MCV 98.3  78.0 - 100.0 fL   MCH 32.3  26.0 - 34.0 pg   MCHC 32.9  30.0 - 36.0 g/dL   RDW 78.2 (*) 95.6 - 21.3 %   Platelets 57 (*) 150 - 400 K/uL  PROTIME-INR  Status: Abnormal   Collection Time    07/01/12  5:50 AM      Result Value Range   Prothrombin Time 19.4 (*) 11.6 - 15.2 seconds   INR 1.69 (*) 0.00 - 1.49    Studies/Results: US Paracentesis  06/29/2012   *RADIOLOGY REPORT*  Clinical Data: Primary sclerosing cholangitis, findings consistent with cirrhosis on CT scan, ascites.  Request is made for diagnostic and therapeutic paracentesis.  ULTRASOUND GUIDED DIAGNOSTIC AND THERAPEUTIC  PARACENTESIS  An ultrasound guided paracentesis was thoroughly discussed with the patient and questions answered.  The benefits, risks, alternatives and complications were also discussed.  The patient understands and wishes to proceed with the procedure.  Written consent was obtained.  Ultrasound was performed to localize and mark an adequate pocket of fluid in the right mid quadrant of the abdomen.  The area was then prepped and draped in the normal sterile fashion.  1% Lidocaine was used for local anesthesia.  Under ultrasound guidance a 19 gauge Yueh catheter was introduced.  Paracentesis was performed.  The catheter was removed and a dressing applied.  Complications:  none  Findings:  A total of approximately 860 cc's of yellow fluid was removed.  A fluid sample was sent for laboratory analysis.  IMPRESSION: Successful ultrasound guided diagnostic and therapeutic paracentesis yielding 860 cc's of ascites.  Read by: Jeananne Rama, P.A.-C   Original  Report Authenticated By: Malachy Moan, M.D.    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 Hopefully home in the next day or 2 with overall reassessment of the stage of her disease by Dr.Schooler and possible referral for reevaluation of transplant center  Cayde Held C 07/01/2012, 9:40 AM

## 2012-07-02 LAB — COMPREHENSIVE METABOLIC PANEL
ALT: 26 U/L (ref 0–35)
AST: 155 U/L — ABNORMAL HIGH (ref 0–37)
Albumin: 2.6 g/dL — ABNORMAL LOW (ref 3.5–5.2)
CO2: 29 mEq/L (ref 19–32)
Calcium: 8.6 mg/dL (ref 8.4–10.5)
Sodium: 126 mEq/L — ABNORMAL LOW (ref 135–145)
Total Protein: 6.8 g/dL (ref 6.0–8.3)

## 2012-07-02 LAB — CBC
HCT: 27.2 % — ABNORMAL LOW (ref 36.0–46.0)
MCV: 97.5 fL (ref 78.0–100.0)
RBC: 2.79 MIL/uL — ABNORMAL LOW (ref 3.87–5.11)
WBC: 9.8 10*3/uL (ref 4.0–10.5)

## 2012-07-02 MED ORDER — NICOTINE 14 MG/24HR TD PT24
14.0000 mg | MEDICATED_PATCH | Freq: Once | TRANSDERMAL | Status: AC
  Administered 2012-07-02: 14 mg via TRANSDERMAL
  Filled 2012-07-02: qty 1

## 2012-07-02 MED ORDER — LORAZEPAM 0.5 MG PO TABS
0.5000 mg | ORAL_TABLET | Freq: Two times a day (BID) | ORAL | Status: DC
  Administered 2012-07-02 – 2012-07-05 (×7): 0.5 mg via ORAL
  Filled 2012-07-02 (×7): qty 1

## 2012-07-02 MED ORDER — FUROSEMIDE 40 MG PO TABS: 60.0000 mg | ORAL_TABLET | Freq: Every day | ORAL | Status: DC

## 2012-07-02 MED ORDER — FUROSEMIDE 10 MG/ML IJ SOLN
40.0000 mg | Freq: Two times a day (BID) | INTRAMUSCULAR | Status: DC
  Administered 2012-07-02 – 2012-07-03 (×2): 40 mg via INTRAVENOUS
  Filled 2012-07-02 (×4): qty 4

## 2012-07-02 MED ORDER — SPIRONOLACTONE 50 MG PO TABS
150.0000 mg | ORAL_TABLET | Freq: Every day | ORAL | Status: DC
  Administered 2012-07-02 – 2012-07-05 (×4): 150 mg via ORAL
  Filled 2012-07-02 (×5): qty 1

## 2012-07-02 MED ORDER — NADOLOL 20 MG PO TABS
20.0000 mg | ORAL_TABLET | Freq: Every day | ORAL | Status: DC
  Administered 2012-07-02 – 2012-07-03 (×2): 20 mg via ORAL
  Filled 2012-07-02 (×2): qty 1

## 2012-07-02 MED ORDER — NICOTINE 21 MG/24HR TD PT24
35.0000 mg | MEDICATED_PATCH | Freq: Every day | TRANSDERMAL | Status: DC
  Administered 2012-07-03 – 2012-07-05 (×3): 35 mg via TRANSDERMAL
  Filled 2012-07-02 (×4): qty 1

## 2012-07-02 NOTE — Care Management Note (Signed)
    Page 1 of 1   07/04/2012     2:10:33 PM   CARE MANAGEMENT NOTE 07/04/2012  Patient:  [name], [name]   Account Number:  [id number]  Date Initiated:  07/02/2012  Documentation initiated by:  Letha Cape  Subjective/Objective Assessment:   dx abd pain, ascites  admit- lives with son, pta indep.     Action/Plan:   Anticipated DC Date:  07/04/2012   Anticipated DC Plan:  HOME/SELF CARE      DC Planning Services  CM consult      Choice offered to / List presented to:             Status of service:  Completed, signed off Medicare Important Message given?   (If response is "NO", the following Medicare IM given date fields will be blank) Date Medicare IM given:   Date Additional Medicare IM given:    Discharge Disposition:  HOME/SELF CARE  Per UR Regulation:  Reviewed for med. necessity/level of care/duration of stay  If discussed at Long Length of Stay Meetings, dates discussed:    Comments:  07/04/12 14:08 Letha Cape RN, BSN [phone] patient is for dc today, pta indep.  Patient has medication coverage and has transportation at dc.  Per MD, patient will f/u with [facility] about transplant.  07/03/12 16:58 Letha Cape RN, BSN [phone] patient with worsening liver function and ascites, per Gi will try to diurese to keep ascites stable, otherwise will need to go to [facility] for transplant.  NCM will continue to follow for dc needs.

## 2012-07-02 NOTE — Progress Notes (Signed)
PROGRESS NOTE  [name] WUJ:[id number] DOB: [birth year] DOA: 06/28/2012 PCP: No primary provider on file.  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 Somewhat anxious. denies n/v Poor sleep overnight.  Family concerns making her anxious. Some constipation Pain meds orally controlling symptoms   Objective    Interim History: See above  Telemetry: Mild sinus tachycardia  Objective: Filed Vitals:   06/30/12 2108 07/01/12 0559 07/01/12 2118 07/02/12 0500  BP: 103/69 106/68 105/66 115/63  Pulse: 104 83 117 108  Temp: 99.5 F (37.5 C) 98.7 F (37.1 C) 99.9 F (37.7 C) 98.4 F (36.9 C)  TempSrc: Oral Oral Oral Oral  Resp: 20 18 18 18   Height:      Weight:  69 kg (152 lb 1.9 oz)  70.1 kg (154 lb 8.7 oz)  SpO2: 96% 97% 93% 98%    Intake/Output Summary (Last 24 hours) at 07/02/12 1120 Last data filed at 07/02/12 0900  Gross per 24 hour  Intake    600 ml  Output    650 ml  Net    -50 ml    Exam:  General: Alert anxious  icteric female Cardiovascular: S1-S2 no murmur rub or Respiratory: Clinically clear Abdomen: Distended, Reduce it, hepatomegaly probably about 5-10 cm below right costal edge Skin yellow Neuro no asterixis  Data Reviewed: Basic Metabolic Panel:  Recent Labs Lab 06/28/12 2155 06/29/12 0535 06/30/12 0830 07/01/12 0550 07/02/12 0550  NA 135 135 133* 128* 126*  K 3.0* 3.0* 3.8 3.2* 4.1  CL 95* 95* 93* 90* 87*  CO2 29 28 28  32 29  GLUCOSE 88 78 82 128* 118*  BUN 4* 4* 8 7 5*  CREATININE 0.39* 0.39* 0.41* 0.51 0.51  CALCIUM 7.7* 7.9* 8.4 8.7 8.6   Liver Function Tests:  Recent Labs Lab 06/28/12 2155 06/29/12 0535 06/30/12 0830 07/01/12 0550 07/02/12 0550  AST 222* 220* 204* 169* 155*  ALT 29 28 28 25 26   ALKPHOS 546* 542* 523* 498* 461*  BILITOT 6.5* 6.8* 9.1* 8.3* 9.7*  PROT 6.7 6.6 6.5 6.5 6.8  ALBUMIN 2.5* 2.5* 2.5* 2.5* 2.6*    Recent Labs Lab 06/28/12 2155 06/29/12 0535  LIPASE 22 17    Recent Labs Lab 06/28/12 2155 06/29/12 0535 06/30/12 1000  AMMONIA 72* 96* 250*   CBC:  Recent Labs Lab 06/28/12 2155 06/29/12 0535 06/30/12 0830 07/01/12 0550 07/02/12 0550  WBC 5.4 5.6 5.2 6.3 9.8  NEUTROABS 3.7  --   --   --   --   HGB 9.7* 9.6* 9.7* 9.7* 9.2*  HCT 28.3* 29.1* 28.9* 29.5* 27.2*  MCV 92.8 95.7 95.1 98.3 97.5  PLT 50* 53* 48* 57* 64*   Cardiac Enzymes: No results found for this basename: CKTOTAL, CKMB, CKMBINDEX, TROPONINI,  in the last 168 hours BNP: No components found with this basename: POCBNP,  CBG:  Recent Labs Lab 06/29/12 1943 06/29/12 2202 06/30/12 0739 06/30/12 1135 06/30/12 1714  GLUCAP 96 80 81 104* 142*    Recent Results (from the past 240 hour(s))  CULTURE, BLOOD (ROUTINE X 2)     Status: None   Collection Time    06/28/12 10:50 PM      Result Value Range Status   Specimen Description BLOOD ARM RIGHT   Final   Special Requests BOTTLES DRAWN AEROBIC AND ANAEROBIC 10CC   Final   Culture  Setup Time 06/29/2012  09:08   Final   Culture     Final   Value:        BLOOD CULTURE RECEIVED NO GROWTH TO DATE CULTURE WILL BE HELD FOR 5 DAYS BEFORE ISSUING A FINAL NEGATIVE REPORT   Report Status PENDING   Incomplete  CULTURE, BLOOD (ROUTINE X 2)     Status: None   Collection Time    06/28/12 11:00 PM      Result Value Range Status   Specimen Description BLOOD HAND RIGHT   Final   Special Requests BOTTLES DRAWN AEROBIC ONLY 10CC   Final   Culture  Setup Time 06/29/2012 09:09   Final   Culture     Final   Value:        BLOOD CULTURE RECEIVED NO GROWTH TO DATE CULTURE WILL BE HELD FOR 5 DAYS BEFORE ISSUING A FINAL NEGATIVE REPORT   Report Status PENDING   Incomplete  BODY FLUID CULTURE     Status: None   Collection Time    06/29/12  4:02 PM      Result Value Range Status   Specimen Description ASCITIC ABDOMEN FLUID   Final   Special Requests FLUID   Final   Gram Stain     Final   Value: MODERATE WBC PRESENT,BOTH PMN AND MONONUCLEAR     NO ORGANISMS SEEN     Performed at [facility]   Culture NO GROWTH 2 DAYS   Final   Report Status PENDING   Incomplete  GRAM STAIN     Status: None   Collection Time    06/29/12  4:02 PM      Result Value Range Status   Specimen Description ASCITIC ABDOMEN FLUID   Final   Special Requests FLUID   Final   Gram Stain     Final   Value: MODERATE WBC PRESENT,BOTH PMN AND MONONUCLEAR     NO ORGANISMS SEEN   Report Status 06/29/2012 FINAL   Final     Studies:              All Imaging reviewed and is as per above notation   Scheduled Meds: . ciprofloxacin  500 mg Oral BID  . folic acid  1 mg Oral Daily  . furosemide  40 mg Intravenous BID  . multivitamin with minerals  1 tablet Oral Daily  . nicotine  21 mg Transdermal Daily  . potassium chloride  40 mEq Oral BID  . sodium chloride  3 mL Intravenous Q12H  . sodium chloride  3 mL Intravenous Q12H  . spironolactone  150 mg Oral Daily  . thiamine  100 mg Oral Daily  . ursodiol  300 mg Oral BID    Continuous  Infusions:    Assessment/Plan: 1. 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  Added Lasix/ Aldactone 6/28.  Transitioned PO to IV lasix 40 bid 6/30.  Continue Aldactone current dose 150 mg daily.  Fluid restrict to 1,000 cc as she has been drinking a lot of fluids.  Add low dose BB as per below 2. ? SBP-Although Ascitic fluid is non-suggestive, she has low grade fever-  Continue Cipro 500 bid for total 4-5 day period. 3. Primary sclerosing cholangitis-potentially can have hepatocellular carcinoma/cholangiocarcinoma from this. Follow CA 19-9, alpha-fetoprotein. Arranging with [facility] transplant service an out-patient follow-up-PAtient should call to get this set-up. She is to call Terald Sleeper secretary-Dr. Smith/Dr. Berg] [phone] 4. Tobacco abuse-recommended to quit-increased NIcoderm patch 5. Sinus tachcyardia-Anxiety?-Will Add low dose Nadolol with parameters, Add ativan 0.5 bid 6. Recreational alcohol use-recommended to absolutely desist from the same given new onset cirrhosis-her CIWA score is neg  Code Status: Full Family Communication: As per patient/boyfriend bedside Disposition Plan: Inpatient   Pleas Koch, MD  [facility] Pager [phone] 07/02/2012, 11:20 AM    LOS: 4 days

## 2012-07-02 NOTE — Progress Notes (Signed)
[facility] Progress Note  Subjective: Isn't complaining of abdominal distention and general discomfort, ascites leak has slowed  Objective: Vital signs in last 24 hours: Temp:  [98.4 F (36.9 C)-99.9 F (37.7 C)] 98.4 F (36.9 C) (06/30 0500) Pulse Rate:  [108-117] 108 (06/30 0500) Resp:  [18] 18 (06/30 0500) BP: (105-115)/(63-66) 115/63 mmHg (06/30 0500) SpO2:  [93 %-98 %] 98 % (06/30 0500) Weight:  [70.1 kg (154 lb 8.7 oz)] 70.1 kg (154 lb 8.7 oz) (06/30 0500) Weight change: 1.1 kg (2 lb 6.8 oz)   PE: Abdomen moderately distended with slightly wet and bandage over her paracentesis site  Lab Results: Results for orders placed during the hospital encounter of 06/28/12 (from the past 24 hour(s))  COMPREHENSIVE METABOLIC PANEL     Status: Abnormal   Collection Time    07/02/12  5:50 AM      Result Value Range   Sodium 126 (*) 135 - 145 mEq/L   Potassium 4.1  3.5 - 5.1 mEq/L   Chloride 87 (*) 96 - 112 mEq/L   CO2 29  19 - 32 mEq/L   Glucose, Bld 118 (*) 70 - 99 mg/dL   BUN 5 (*) 6 - 23 mg/dL   Creatinine, Ser 0.98  0.50 - 1.10 mg/dL   Calcium 8.6  8.4 - 11.9 mg/dL   Total Protein 6.8  6.0 - 8.3 g/dL   Albumin 2.6 (*) 3.5 - 5.2 g/dL   AST 147 (*) 0 - 37 U/L   ALT 26  0 - 35 U/L   Alkaline Phosphatase 461 (*) 39 - 117 U/L   Total Bilirubin 9.7 (*) 0.3 - 1.2 mg/dL   GFR calc non Af Amer >90  >90 mL/min   GFR calc Af Amer >90  >90 mL/min  PROTIME-INR     Status: Abnormal   Collection Time    07/02/12  5:50 AM      Result Value Range   Prothrombin Time 18.5 (*) 11.6 - 15.2 seconds   INR 1.59 (*) 0.00 - 1.49  CBC     Status: Abnormal   Collection Time    07/02/12  5:50 AM      Result Value Range   WBC 9.8  4.0 - 10.5 K/uL   RBC 2.79 (*) 3.87 - 5.11 MIL/uL   Hemoglobin 9.2 (*) 12.0 - 15.0 g/dL   HCT 82.9 (*) 56.2 - 13.0 %   MCV 97.5  78.0 - 100.0 fL   MCH 33.0  26.0 - 34.0 pg   MCHC 33.8  30.0 - 36.0 g/dL   RDW 86.5 (*) 78.4 - 69.6 %   Platelets 64 (*)  150 - 400 K/uL    Studies/Results: No results found.    Assessment: 1. 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 New onset of ascites 3. Worsening hepatic dysfunction 4. Continued alcohol use 5. Ulcerative colitis  Plan: 1. Will increase diuretics 2. Patient needs to stop drinking 3. Based on recent CT scan I see no role for ERCP 4. She is developing worsening liver disease and once discharged and will need to be assessed for transplant status which will require that she stopped drinking  Harshita Bernales C 07/02/2012, 10:01 AM

## 2012-07-02 NOTE — Progress Notes (Signed)
Patient complained of constipation, PRN sorbitol given on previous shift. May need to schedule bowel med. Anxious this AM wanting to smoke cigarette. [name] A

## 2012-07-03 LAB — BODY FLUID CULTURE

## 2012-07-03 LAB — COMPREHENSIVE METABOLIC PANEL
AST: 127 U/L — ABNORMAL HIGH (ref 0–37)
Albumin: 2.2 g/dL — ABNORMAL LOW (ref 3.5–5.2)
Alkaline Phosphatase: 406 U/L — ABNORMAL HIGH (ref 39–117)
Alkaline Phosphatase: 396 U/L — ABNORMAL HIGH (ref 39–117)
BUN: 5 mg/dL — ABNORMAL LOW (ref 6–23)
BUN: 5 mg/dL — ABNORMAL LOW (ref 6–23)
CO2: 30 mEq/L (ref 19–32)
Calcium: 8.3 mg/dL — ABNORMAL LOW (ref 8.4–10.5)
Chloride: 93 mEq/L — ABNORMAL LOW (ref 96–112)
Creatinine, Ser: 0.48 mg/dL — ABNORMAL LOW (ref 0.50–1.10)
GFR calc non Af Amer: >90 mL/min (ref >90)
Potassium: 4.4 mEq/L (ref 3.5–5.1)
Sodium: 126 mEq/L — ABNORMAL LOW (ref 135–145)
Total Bilirubin: 10.4 mg/dL — ABNORMAL HIGH (ref 0.3–1.2)
Total Protein: 6 g/dL (ref 6.0–8.3)

## 2012-07-03 LAB — CBC WITH DIFFERENTIAL/PLATELET
Basophils Relative: 1 % (ref 0–1)
Eosinophils Relative: 2 % (ref 0–5)
Hemoglobin: 8.8 g/dL — ABNORMAL LOW (ref 12.0–15.0)
Lymphocytes Relative: 17 % (ref 12–46)
MCH: 32.6 pg (ref 26.0–34.0)
Monocytes Absolute: 1.1 10*3/uL — ABNORMAL HIGH (ref 0.1–1.0)
Monocytes Relative: 13 % — ABNORMAL HIGH (ref 3–12)
Neutrophils Relative %: 67 % (ref 43–77)
Platelets: 77 10*3/uL — ABNORMAL LOW (ref 150–400)
RBC: 2.7 MIL/uL — ABNORMAL LOW (ref 3.87–5.11)
WBC: 8.8 10*3/uL (ref 4.0–10.5)

## 2012-07-03 LAB — PROTIME-INR: Prothrombin Time: 19 seconds — ABNORMAL HIGH (ref 11.6–15.2)

## 2012-07-03 MED ORDER — NADOLOL 20 MG PO TABS
10.0000 mg | ORAL_TABLET | Freq: Every day | ORAL | Status: DC
  Administered 2012-07-04 – 2012-07-05 (×2): 10 mg via ORAL
  Filled 2012-07-03 (×4): qty 1

## 2012-07-03 MED ORDER — FUROSEMIDE 10 MG/ML IJ SOLN
40.0000 mg | Freq: Four times a day (QID) | INTRAMUSCULAR | Status: DC
  Administered 2012-07-03 – 2012-07-04 (×4): 40 mg via INTRAVENOUS
  Filled 2012-07-03 (×8): qty 4

## 2012-07-03 NOTE — Progress Notes (Signed)
Pt refused bedalarm. Will continue to monitor patient. Jerrye Bushy

## 2012-07-03 NOTE — Progress Notes (Signed)
PROGRESS NOTE  [name] ZOX:[id number] DOB: 20-Apr-1970 DOA: 06/28/2012 PCP: No primary provider on file.  Brief narrative: 84 yer old CF with known Primary scleorsing cholangitis, chronic colitis, still occasional ETOH user with heavy ETOH abuse h/o presented to [facility] 06/29/12 with relatively acute onset LE edema, increasing Abd tenderness and orthopnea/dyspnea-found to have decompensated hepatic function with Large vol ascites and tapped 870 cc's 6/28.  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 Otherwise doing well no chest pain shortness breath fever chills nausea vomiting    Objective    Interim History: See above  Telemetry: Mild sinus tachycardia  Objective: Filed Vitals:   07/02/12 1316 07/02/12 2100 07/03/12 0526 07/03/12 1440  BP: 146/73 91/56 93/57  89/56  Pulse: 111 83 76 72  Temp: 98.3 F (36.8 C)  98.5 F (36.9 C) 98.7 F (37.1 C)  TempSrc: Oral Oral Oral Oral  Resp: 20 20 18 18   Height:      Weight:   70.1 kg (154 lb 8.7 oz)   SpO2: 99% 98% 93% 97%    Intake/Output Summary (Last 24 hours) at 07/03/12 1718 Last data filed at 07/03/12 1300  Gross per 24 hour  Intake    423 ml  Output   1350 ml  Net   -927 ml    Exam:  General: Alert anxious icteric  female Cardiovascular: S1-S2 no murmur rub or Respiratory: Clinically clear Abdomen: Distended but somewhat reduced hepatomegaly probably about 5-10 cm below right costal edge Skin yellow Neuro no asterixis  Data Reviewed: Basic Metabolic Panel:  Recent Labs Lab 06/30/12 0830 07/01/12 0550 07/02/12 0550 07/03/12 0015 07/03/12 0525  NA 133* 128* 126* 127* 126*  K 3.8 3.2* 4.1 4.4 4.4  CL 93* 90* 87* 93* 94*  CO2 28 32 29 30 26   GLUCOSE 82 128* 118* 120* 97  BUN 8 7 5* 5* 5*  CREATININE 0.41* 0.51 0.51 0.48* 0.50  CALCIUM 8.4 8.7 8.6 8.6 8.3*   Liver Function Tests:  Recent Labs Lab 06/30/12 0830 07/01/12 0550 07/02/12 0550 07/03/12 0015 07/03/12 0525  AST 204* 169* 155* 127* 116*  ALT 28 25 26 24 22   ALKPHOS 523* 498* 461* 406* 396*  BILITOT 9.1* 8.3* 9.7* 10.4* 9.5*  PROT 6.5 6.5 6.8 6.2 6.0  ALBUMIN 2.5* 2.5* 2.6* 2.2* 2.2*    Recent Labs Lab 06/28/12 2155 06/29/12 0535  LIPASE 22 17    Recent Labs Lab 06/28/12 2155 06/29/12 0535 06/30/12 1000  AMMONIA 72* 96* 250*   CBC:  Recent Labs Lab 06/28/12 2155 06/29/12 0535 06/30/12 0830 07/01/12 0550 07/02/12 0550 07/03/12 0525  WBC 5.4 5.6 5.2 6.3 9.8 8.8  NEUTROABS 3.7  --   --   --   --  5.9  HGB 9.7* 9.6* 9.7* 9.7* 9.2* 8.8*  HCT 28.3* 29.1* 28.9* 29.5* 27.2* 26.9*  MCV 92.8  95.7 95.1 98.3 97.5 99.6  PLT 50* 53* 48* 57* 64* 77*   Cardiac Enzymes: No results found for this basename: CKTOTAL, CKMB, CKMBINDEX, TROPONINI,  in the last 168 hours BNP: No components found with this basename: POCBNP,  CBG:  Recent Labs Lab 06/29/12 1943 06/29/12 2202 06/30/12 0739 06/30/12 1135 06/30/12 1714  GLUCAP 96 80 81 104* 142*    Recent Results (from the past 240 hour(s))  CULTURE, BLOOD (ROUTINE X 2)     Status: None   Collection Time    06/28/12 10:50 PM      Result Value Range Status   Specimen Description BLOOD ARM RIGHT   Final   Special Requests BOTTLES DRAWN AEROBIC AND ANAEROBIC  10CC   Final   Culture  Setup Time 06/29/2012 09:08   Final   Culture     Final   Value:        BLOOD CULTURE RECEIVED NO GROWTH TO DATE CULTURE WILL BE HELD FOR 5 DAYS BEFORE ISSUING A FINAL NEGATIVE REPORT   Report Status PENDING   Incomplete  CULTURE, BLOOD (ROUTINE X 2)     Status: None   Collection Time    06/28/12 11:00 PM      Result Value Range Status   Specimen Description BLOOD HAND RIGHT   Final   Special Requests BOTTLES DRAWN AEROBIC ONLY 10CC   Final   Culture  Setup Time 06/29/2012 09:09   Final   Culture     Final   Value:        BLOOD CULTURE RECEIVED NO GROWTH TO DATE CULTURE WILL BE HELD FOR 5 DAYS BEFORE ISSUING A FINAL NEGATIVE REPORT   Report Status PENDING   Incomplete  BODY FLUID CULTURE     Status: None   Collection Time    06/29/12  4:02 PM      Result Value Range Status   Specimen Description ASCITIC ABDOMEN FLUID   Final   Special Requests FLUID   Final   Gram Stain     Final   Value: MODERATE WBC PRESENT,BOTH PMN AND MONONUCLEAR     NO ORGANISMS SEEN     Performed at [facility]   Culture NO GROWTH 3 DAYS   Final   Report Status 07/03/2012 FINAL   Final  GRAM STAIN     Status: None   Collection Time    06/29/12  4:02 PM      Result Value Range Status   Specimen Description ASCITIC ABDOMEN FLUID   Final   Special Requests FLUID   Final   Gram Stain     Final   Value: MODERATE WBC PRESENT,BOTH PMN AND MONONUCLEAR     NO ORGANISMS SEEN   Report Status 06/29/2012 FINAL   Final     Studies:              All Imaging reviewed and is as per above notation   Scheduled Meds: . ciprofloxacin  500 mg Oral BID  . folic acid  1 mg Oral Daily  . furosemide  40 mg Intravenous Q6H  . LORazepam  0.5 mg Oral BID  . multivitamin with minerals  1 tablet Oral Daily  . nadolol  20 mg Oral Daily  . nicotine  35 mg Transdermal Daily  . potassium chloride  40 mEq Oral BID  . sodium chloride  3 mL Intravenous Q12H  . sodium chloride  3 mL  Intravenous Q12H  . spironolactone  150 mg Oral Daily  . thiamine  100 mg Oral Daily  . ursodiol  300 mg Oral BID   Continuous Infusions:    Assessment/Plan: 1. 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  Added Lasix/ Aldactone 6/28.  Transitioned PO to IV lasix 40 qid 7/1 Continue Aldactone current dose 150 mg daily.  Fluid restricted to 1,000 cc 6/30 as she has been drinking a lot of fluids.  Continue low dose BB as per below-have scheduled for rpt Large volume paracentesis 07/04/12 as well for comfort and will re-assess. 2. ? SBP-Although ascitic fluid is non-suggestive, she has low grade fever-continue Cipro 500 bid for now-will need Norfloxacin 400 daily on discharge to prevent SBP. 3. Primary sclerosing cholangitis-potentially can have hepatocellular carcinoma/cholangiocarcinoma from this. Negative CA 19-9, alpha-fetoprotein. Arranging with [facility] transplant service an out-patient follow-up-PAtient should call to get this set-up. She is to call Terald Sleeper secretary-Dr. Smith/Dr. Sharlene Motts [phone]. 4. Macrocytic anemia + Mod TCP-probably due to #1 and to #8 5. Tobacco abuse-recommended to quit-increased NIcoderm patch 6. Sinus tachcyardia-Anxiety?-Will Add low dose Nadolol with parameters, Add ativan 0.5 bid. 7. Hypotension-Iatrogenic-Probably 2/2 to fluid restriction/Nadolol.  Cut back dose Nadolol to 10 mg. 8. social alcohol use-recommended to absolutely desist from the same given new onset cirrhosis-her CIWA score is neg.   Code Status: Full Family Communication: patient updated Disposition Plan: Inpatient   Pleas Koch, MD  [facility] Pager [phone] 07/03/2012, 5:18 PM    LOS: 5 days

## 2012-07-03 NOTE — Progress Notes (Signed)
[facility] Progress Note  Subjective: The patient better, feels that her abdominal distention is less and somewhat. The paracentesis leak seems to have stopped  Objective: Vital signs in last 24 hours: Temp:  [98.3 F (36.8 C)-98.5 F (36.9 C)] 98.5 F (36.9 C) (07/01 0526) Pulse Rate:  [76-111] 76 (07/01 0526) Resp:  [18-20] 18 (07/01 0526) BP: (91-146)/(56-73) 93/57 mmHg (07/01 0526) SpO2:  [93 %-99 %] 93 % (07/01 0526) Weight:  [70.1 kg (154 lb 8.7 oz)] 70.1 kg (154 lb 8.7 oz) (07/01 0526) Weight change: 0 kg (0 lb)   PE: Unchanged  Lab Results: Results for orders placed during the hospital encounter of 06/28/12 (from the past 24 hour(s))  COMPREHENSIVE METABOLIC PANEL     Status: Abnormal   Collection Time    07/03/12 12:15 AM      Result Value Range   Sodium 127 (*) 135 - 145 mEq/L   Potassium 4.4  3.5 - 5.1 mEq/L   Chloride 93 (*) 96 - 112 mEq/L   CO2 30  19 - 32 mEq/L   Glucose, Bld 120 (*) 70 - 99 mg/dL   BUN 5 (*) 6 - 23 mg/dL   Creatinine, Ser 1.61 (*) 0.50 - 1.10 mg/dL   Calcium 8.6  8.4 - 09.6 mg/dL   Total Protein 6.2  6.0 - 8.3 g/dL   Albumin 2.2 (*) 3.5 - 5.2 g/dL   AST 045 (*) 0 - 37 U/L   ALT 24  0 - 35 U/L   Alkaline Phosphatase 406 (*) 39 - 117 U/L   Total Bilirubin 10.4 (*) 0.3 - 1.2 mg/dL   GFR calc non Af Amer >90  >90 mL/min   GFR calc Af Amer >90  >90 mL/min  COMPREHENSIVE METABOLIC PANEL     Status: Abnormal   Collection Time    07/03/12  5:25 AM      Result Value Range   Sodium 126 (*) 135 - 145 mEq/L   Potassium 4.4  3.5 - 5.1 mEq/L   Chloride 94 (*) 96 - 112 mEq/L   CO2 26  19 - 32 mEq/L   Glucose, Bld 97  70 - 99 mg/dL   BUN 5 (*) 6 - 23 mg/dL   Creatinine, Ser 4.09  0.50 - 1.10 mg/dL   Calcium 8.3 (*) 8.4 - 10.5 mg/dL   Total Protein 6.0  6.0 - 8.3 g/dL   Albumin 2.2 (*) 3.5 - 5.2 g/dL   AST 811 (*) 0 - 37 U/L   ALT 22  0 - 35 U/L   Alkaline Phosphatase 396 (*) 39 - 117 U/L   Total Bilirubin 9.5 (*) 0.3 - 1.2 mg/dL    GFR calc non Af Amer >90  >90 mL/min   GFR calc Af Amer >90  >90 mL/min  PROTIME-INR     Status: Abnormal   Collection Time    07/03/12  5:25 AM      Result Value Range   Prothrombin Time 19.0 (*) 11.6 - 15.2 seconds   INR 1.64 (*) 0.00 - 1.49  CBC WITH DIFFERENTIAL     Status: Abnormal   Collection Time    07/03/12  5:25 AM      Result Value Range   WBC 8.8  4.0 - 10.5 K/uL   RBC 2.70 (*) 3.87 - 5.11 MIL/uL   Hemoglobin 8.8 (*) 12.0 - 15.0 g/dL   HCT 91.4 (*) 78.2 - 95.6 %   MCV 99.6  78.0 - 100.0  fL   MCH 32.6  26.0 - 34.0 pg   MCHC 32.7  30.0 - 36.0 g/dL   RDW 16.1 (*) 09.6 - 04.5 %   Platelets 77 (*) 150 - 400 K/uL   Neutrophils Relative % 67  43 - 77 %   Lymphocytes Relative 17  12 - 46 %   Monocytes Relative 13 (*) 3 - 12 %   Eosinophils Relative 2  0 - 5 %   Basophils Relative 1  0 - 1 %   Neutro Abs 5.9  1.7 - 7.7 K/uL   Lymphs Abs 1.5  0.7 - 4.0 K/uL   Monocytes Absolute 1.1 (*) 0.1 - 1.0 K/uL   Eosinophils Absolute 0.2  0.0 - 0.7 K/uL   Basophils Absolute 0.1  0.0 - 0.1 K/uL   RBC Morphology POLYCHROMASIA PRESENT      Studies/Results: No results found.    Assessment: Primary sclerosing cholangitis with advancement of disease based on worsening liver function and ascites  Plan: Try to achieve diuresis to keep her ascites stable. Otherwise try to get her back to [facility] for reevaluation for possible transplant. Once again admonished that she needs to be completely abstinent from alcohol use.    [name] C 07/03/2012, 10:12 AM

## 2012-07-04 ENCOUNTER — Inpatient Hospital Stay (HOSPITAL_COMMUNITY): Payer: Medicaid Other

## 2012-07-04 LAB — COMPREHENSIVE METABOLIC PANEL
Alkaline Phosphatase: 388 U/L — ABNORMAL HIGH (ref 39–117)
BUN: 9 mg/dL (ref 6–23)
GFR calc Af Amer: >90 mL/min (ref >90)
Glucose, Bld: 110 mg/dL — ABNORMAL HIGH (ref 70–99)
Potassium: 4.1 mEq/L (ref 3.5–5.1)
Total Protein: 6.3 g/dL (ref 6.0–8.3)

## 2012-07-04 LAB — CBC WITH DIFFERENTIAL/PLATELET
Basophils Absolute: 0.1 10*3/uL (ref 0.0–0.1)
HCT: 28.1 % — ABNORMAL LOW (ref 36.0–46.0)
Lymphocytes Relative: 16 % (ref 12–46)
Monocytes Relative: 19 % — ABNORMAL HIGH (ref 3–12)
Neutro Abs: 5.6 10*3/uL (ref 1.7–7.7)
Neutrophils Relative %: 62 % (ref 43–77)
Platelets: 99 10*3/uL — ABNORMAL LOW (ref 150–400)
RDW: 21.7 % — ABNORMAL HIGH (ref 11.5–15.5)
WBC: 9.1 10*3/uL (ref 4.0–10.5)

## 2012-07-04 MED ORDER — OXYCODONE HCL 5 MG PO TABS
5.0000 mg | ORAL_TABLET | Freq: Four times a day (QID) | ORAL | Status: DC | PRN
  Administered 2012-07-04 – 2012-07-05 (×2): 5 mg via ORAL
  Filled 2012-07-04 (×2): qty 1

## 2012-07-04 MED ORDER — FUROSEMIDE 40 MG PO TABS
60.0000 mg | ORAL_TABLET | Freq: Every day | ORAL | Status: DC
  Administered 2012-07-04 – 2012-07-05 (×2): 60 mg via ORAL
  Filled 2012-07-04 (×3): qty 1

## 2012-07-04 MED ORDER — LACTULOSE 10 GM/15ML PO SOLN
10.0000 g | Freq: Every day | ORAL | Status: DC
  Administered 2012-07-04 – 2012-07-05 (×2): 10 g via ORAL
  Filled 2012-07-04 (×3): qty 15

## 2012-07-04 MED ORDER — ALBUMIN HUMAN 25 % IV SOLN
50.0000 g | Freq: Once | INTRAVENOUS | Status: DC
  Filled 2012-07-04: qty 200

## 2012-07-04 NOTE — Progress Notes (Signed)
[facility] PROGRESS NOTE  [name] WUJ:[id number] DOB: [birth year] DOA: 06/28/2012 PCP: No primary provider on file.  HPI/Subjective: Pt states she is feeling okay today. She is still having some abdominal pain and feels like her belly is still full. She states her urine is still dark yellow. She also notes some increased fatigue, has been seeing cockroaches and kittens in her room, and has been hearing voices  yesterday. She denies N/V/D, denies SOB, chest pain.  Assessment/Plan: Decompensated Cirrhosis with PSC - Paracentesis performed 6/27 - Pt has been on Lasix IV 40 QID started 7/1 - will transition to po lasix today - Continue Aldactone 150 mg daily - Continue fluid restriction (1000 cc) - Will have repeat paracentesis today - Pros/Cons discussed with pt and decision made to proceed with the procedure - Not concerned for SBP at this time - will not D/C with ABX - Pt given information regarding liver transplant and instructed to call [facility] to start the process.  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 - Elevated Ammonia level - 250 on 6/28 - Auditory/Visual hallucinations - Will give lactulose  - Stop Morphine; Oxycodone transitioned from every 4 hrs to every 6 hours  Anemia - Stable - Likely secondary to cirrhosis/alcohol use - Continue to monitor CBC  Thrombocytopenia - Stable, improving  - Likely secondary to cirrhosis/ alcohol use - Continue to monitor CBC  Hypotension - Likely iatrogenic secondary to fluid restriction/Nadolol - Nadolol decreased - continue 10 mg daily  Tobacco Abuse - Nicoderm patch while inpatient - Recommended smoking cessation  Alcohol Use - Recommended pt resist use with new onset  cirrhosis   Code Status: Full Family Communication: None at bedside  Disposition Plan: Inpatient, will D/C tomorrow  Consultants:  GI - Dorena Cookey, MD  Procedures:  Paracentesis today at 11:00  Antibiotics:  Ceftriaxone 1 g, 1x dose - 6/26  Cipro 500 mg PO bid - 6/29 >> stop at discharge.   Objective: Filed Vitals:   07/03/12 2202 07/03/12 2342 07/04/12 0438 07/04/12 0610  BP: 92/61 95/62 93/61    Pulse:   78   Temp:   98.7 F (37.1 C)   TempSrc:   Oral   Resp:   20   Height:      Weight:   66.4 kg (146 lb 6.2 oz)   SpO2:    99%    Intake/Output Summary (Last 24 hours) at 07/04/12 1030 Last data filed at 07/04/12 0715  Gross per 24 hour  Intake    462 ml  Output   3975 ml  Net  -3513 ml   Filed Weights   07/02/12 0500 07/03/12 0526 07/04/12 0438  Weight: 70.1 kg (154 lb 8.7 oz) 70.1 kg (154 lb 8.7 oz) 66.4 kg (146 lb 6.2 oz)    Exam:   General:  A & O x3, NAD, sitting on edge of bed  Eyes: 2-3 + icteris    Cardiovascular: RRR, no m/g/r, no peripheral edema  Respiratory: CTA bilaterally, no wheezes, rales, or rhonchi  Abdomen: BS+, soft, TTP in RUQ/RLQ, hepatomegaly, generalized ascites throughout abdomen, collateral venous channels noted  Musculoskeletal: ambulating without difficulty, moving all extremities   Skin:  Multiple bruises, 2+ Jaundice  Data Reviewed: Basic Metabolic Panel:  Recent Labs Lab 07/01/12 0550 07/02/12 0550 07/03/12 0015 07/03/12 9562  07/04/12 0515  NA 128* 126* 127* 126* 128*  K 3.2* 4.1 4.4 4.4 4.1  CL 90* 87* 93* 94* 93*  CO2 32 29 30 26 27   GLUCOSE 128* 118* 120* 97 110*  BUN 7 5* 5* 5* 9  CREATININE 0.51 0.51 0.48* 0.50 0.57  CALCIUM 8.7 8.6 8.6 8.3* 8.7   Liver Function Tests:  Recent Labs Lab 07/01/12 0550 07/02/12 0550 07/03/12 0015 07/03/12 0525 07/04/12 0515  AST 169* 155* 127* 116* 107*  ALT 25 26 24 22 23   ALKPHOS 498* 461* 406* 396* 388*  BILITOT 8.3* 9.7* 10.4* 9.5* 9.4*  PROT 6.5 6.8  6.2 6.0 6.3  ALBUMIN 2.5* 2.6* 2.2* 2.2* 2.1*    Recent Labs Lab 06/28/12 2155 06/29/12 0535  LIPASE 22 17    Recent Labs Lab 06/28/12 2155 06/29/12 0535 06/30/12 1000  AMMONIA 72* 96* 250*   CBC:  Recent Labs Lab 06/28/12 2155  06/30/12 0830 07/01/12 0550 07/02/12 0550 07/03/12 0525 07/04/12 0515  WBC 5.4  < > 5.2 6.3 9.8 8.8 9.1  NEUTROABS 3.7  --   --   --   --  5.9 5.6  HGB 9.7*  < > 9.7* 9.7* 9.2* 8.8* 9.2*  HCT 28.3*  < > 28.9* 29.5* 27.2* 26.9* 28.1*  MCV 92.8  < > 95.1 98.3 97.5 99.6 100.4*  PLT 50*  < > 48* 57* 64* 77* 99*  < > = values in this interval not displayed. CBG:  Recent Labs Lab 06/29/12 1943 06/29/12 2202 06/30/12 0739 06/30/12 1135 06/30/12 1714  GLUCAP 96 80 81 104* 142*    Recent Results (from the past 240 hour(s))  CULTURE, BLOOD (ROUTINE X 2)     Status: None   Collection Time    06/28/12 10:50 PM      Result Value Range Status   Specimen Description BLOOD ARM RIGHT   Final   Special Requests BOTTLES DRAWN AEROBIC AND ANAEROBIC 10CC   Final   Culture  Setup Time 06/29/2012 09:08   Final   Culture     Final   Value:        BLOOD CULTURE RECEIVED NO GROWTH TO DATE CULTURE WILL BE HELD FOR 5 DAYS BEFORE ISSUING A FINAL NEGATIVE REPORT   Report Status PENDING   Incomplete  CULTURE, BLOOD (ROUTINE X 2)     Status: None   Collection Time    06/28/12 11:00 PM      Result Value Range Status   Specimen Description BLOOD HAND RIGHT   Final   Special Requests BOTTLES DRAWN AEROBIC ONLY 10CC   Final   Culture  Setup Time 06/29/2012 09:09   Final   Culture     Final   Value:        BLOOD CULTURE RECEIVED NO GROWTH TO DATE CULTURE WILL BE HELD FOR 5 DAYS BEFORE ISSUING A FINAL NEGATIVE REPORT   Report Status PENDING   Incomplete  BODY FLUID CULTURE     Status: None   Collection Time    06/29/12  4:02 PM      Result Value Range Status   Specimen Description ASCITIC ABDOMEN FLUID   Final   Special Requests FLUID   Final   Gram  Stain     Final   Value: MODERATE WBC PRESENT,BOTH PMN AND MONONUCLEAR     NO ORGANISMS SEEN     Performed at [facility]   Culture NO GROWTH 3 DAYS   Final  Report Status 07/03/2012 FINAL   Final  GRAM STAIN     Status: None   Collection Time    06/29/12  4:02 PM      Result Value Range Status   Specimen Description ASCITIC ABDOMEN FLUID   Final   Special Requests FLUID   Final   Gram Stain     Final   Value: MODERATE WBC PRESENT,BOTH PMN AND MONONUCLEAR     NO ORGANISMS SEEN   Report Status 06/29/2012 FINAL   Final     Studies: No results found.  Scheduled Meds: . albumin human  50 g Intravenous Once  . ciprofloxacin  500 mg Oral BID  . folic acid  1 mg Oral Daily  . furosemide  60 mg Oral Daily  . lactulose  10 g Oral Daily  . LORazepam  0.5 mg Oral BID  . multivitamin with minerals  1 tablet Oral Daily  . nadolol  10 mg Oral Daily  . nicotine  35 mg Transdermal Daily  . potassium chloride  40 mEq Oral BID  . sodium chloride  3 mL Intravenous Q12H  . sodium chloride  3 mL Intravenous Q12H  . spironolactone  150 mg Oral Daily  . thiamine  100 mg Oral Daily  . ursodiol  300 mg Oral BID   Continuous Infusions:   Principal Problem:   Abdominal pain Active Problems:   Ascites   Jaundice   Thrombocytopenia   Hepatitis   Primary sclerosing cholangitis   History of Crohn's disease   Ralene Muskrat PA-S Algis Downs, PA-C [facility]  07/04/2012, 10:30 AM  LOS: 6 days

## 2012-07-05 DIAGNOSIS — D696 Thrombocytopenia, unspecified: Secondary | ICD-10-CM

## 2012-07-05 DIAGNOSIS — K8309 Other cholangitis: Secondary | ICD-10-CM

## 2012-07-05 DIAGNOSIS — E871 Hypo-osmolality and hyponatremia: Secondary | ICD-10-CM

## 2012-07-05 LAB — BASIC METABOLIC PANEL
BUN: 9 mg/dL (ref 6–23)
Chloride: 90 mEq/L — ABNORMAL LOW (ref 96–112)
Glucose, Bld: 108 mg/dL — ABNORMAL HIGH (ref 70–99)
Potassium: 4.6 mEq/L (ref 3.5–5.1)

## 2012-07-05 LAB — CBC
HCT: 28.3 % — ABNORMAL LOW (ref 36.0–46.0)
Hemoglobin: 9.4 g/dL — ABNORMAL LOW (ref 12.0–15.0)
MCH: 32.9 pg (ref 26.0–34.0)
MCHC: 33.2 g/dL (ref 30.0–36.0)
RBC: 2.86 MIL/uL — ABNORMAL LOW (ref 3.87–5.11)

## 2012-07-05 LAB — HEPATIC FUNCTION PANEL
AST: 115 U/L — ABNORMAL HIGH (ref 0–37)
Albumin: 2.2 g/dL — ABNORMAL LOW (ref 3.5–5.2)
Total Protein: 6.5 g/dL (ref 6.0–8.3)

## 2012-07-05 LAB — CULTURE, BLOOD (ROUTINE X 2): Culture: NO GROWTH

## 2012-07-05 MED ORDER — FUROSEMIDE 40 MG PO TABS: 40.0000 mg | ORAL_TABLET | Freq: Every day | ORAL | Status: DC

## 2012-07-05 MED ORDER — NADOLOL 20 MG PO TABS: 10.0000 mg | ORAL_TABLET | Freq: Every day | ORAL | Status: DC

## 2012-07-05 MED ORDER — LACTULOSE 10 GM/15ML PO SOLN: 10.0000 g | Freq: Every day | ORAL | Status: DC

## 2012-07-05 MED ORDER — NICOTINE 7 MG/24HR TD PT24: 1.0000 | MEDICATED_PATCH | Freq: Every day | TRANSDERMAL | Status: DC

## 2012-07-05 MED ORDER — SPIRONOLACTONE 100 MG PO TABS: 100.0000 mg | ORAL_TABLET | Freq: Every day | ORAL | Status: DC

## 2012-07-05 MED ORDER — POTASSIUM CHLORIDE 20 MEQ/15ML (10%) PO LIQD: 20.0000 meq | Freq: Two times a day (BID) | ORAL | Status: DC

## 2012-07-05 NOTE — Discharge Summary (Signed)
Physician Discharge Summary  [name] ZOX:[id number] DOB: 27-Jun-1970 DOA: 06/28/2012  PCP: No primary provider on file.  Admit date: 06/28/2012 Discharge date: 07/05/2012  Time spent: 45 minutes  Recommendations for Outpatient Follow-up:  1. Follow-up with Dr. Bosie Clos (GI) 7/14  2. Contact information regarding [facility] transplant given to patient. She plans to contact them upon discharge 3. Recommended smoking and alcohol cessation counseling. 4. Please repeat sodium level on next followup with PCP, please adjust diuretic dosing. On discharge her Lasix was decreased to 40 mg, and that was decreased to 100 mg. She is also on potassium supplementation.  Discharge Diagnoses:  Principal Problem:   Abdominal pain Active Problems:   Ascites   Jaundice   Thrombocytopenia   Hepatitis   Primary sclerosing cholangitis   History of Crohn's disease   Discharge Condition: Stable  Diet recommendation: Low sodium diet  Filed Weights   07/03/12 0526 07/04/12 0438 07/05/12 0538  Weight: 70.1 kg (154 lb 8.7 oz) 66.4 kg (146 lb 6.2 oz) 64.8 kg (142 lb 13.7 oz)    History of present illness:  [name] is a 42 y.o. female with known history of primary sclerosing cholangitis and history of Crohn's disease and ongoing tobacco and alcohol abuse started noticing increasing swelling of her abdomen and lower extremities over the last one week. Patient's abdomen swelling rapidly progressed. Patient also has been having some abdominal discomfort due to the distention. Patient otherwise denied any nausea vomiting diarrhea fever chills. Patient denied having taken any new medications. Patient has had a recent MRI abdomen by patient's gastroenterologist to followup with primary sclerosing cholangitis done June 4 which did not show any acute findings and only minimal ascites. In addition labs show patient's alkaline phosphatase levels are increased and bilirubin also has increased with thrombocytopenia and  anemia. Patient denied any chest pain shortness of breath. Patient was admitted for further management.    Hospital Course:  Decompensated Cirrhosis with PSC  - Patient was admitted, started on diuretics with Aldactone Lasix, she underwent a paracentesis on 6/27. - SAAG was more than 1.1, consistent with portal hypertension, given the clinical scenario, highly likely ascites is coming from cirrhosis. - -On discharge patient will be transitioned to 100 mg of Aldactone (previously on 150 mg), and Lasix 40 mg daily (previously on 60 mg. She's also been started on Nadolol. -Continue fluid restriction (1-1.5 L per day)  - Repeat paracentesis was planned for 7/2- procedure canceled due to lack of ascites found on [place]  - Not concerned for SBP at this time - will not D/C with ABX. Ascites fluid culture was negative, she was empirically started on ciprofloxacin initially, however this will be discontinued on admission. - Pt given information regarding liver transplant and instructed to call [facility] to start the process.  - Stable for outpatient follow-up- she has an appointment with her primary care practitioner on 7/7 and with Dr. Bosie Clos on 7/14  Primary Sclerosing Cholangitis (PSC)  - Concern for hepatocellular carcinoma/cholangiocarcinoma -however- CA 19-9 and alpha-fetoprotein NEGATIVE  - Pt will call [facility] transplant service for outpatient follow-up  - Follow-up appt with Dr. Bosie Clos for 7/14  Hyponatremia  - Likely secondary to ascites, and initiation of diuretic therapy. - Sodium levels for the past few days has been having around 125-128 milliequivalents per liter, patient is asymptomatic. 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 Today, day of discharge she is completely alert and oriented x3. - Will continue daily administration for encephalopathy prophylaxis - Stop all narcotics - Stable for outpatient follow-up  Anemia  - Improving - Likely secondary to cirrhosis/alcohol use  - Stable for outpatient follow-up and monitoring  Thrombocytopenia  - Improving  - Likely secondary to cirrhosis/ alcohol use  - Stable for outpatient follow-up and monitoring  Hypotension  - Mild hypotension with systolic blood pressure in them 90s to low 100s range. Patient asymptomatic and ambulating without any difficulty. - Likely iatrogenic secondary to fluid restriction/Nadolol /diuretic - Nadolol decreased - continue 10 mg daily , diuretics dose has been decreased-Lasix has been decreased to 40 mg, Aldactone has been decreased to 100 mg. - Stable for outpatient follow-up and monitoring  Tobacco Abuse  - Nicoderm patch used while inpatient- patient interested in continuing nicotine transdermally on discharge. - Recommended smoking cessation - extensively counseled  Alcohol Use  - Recommended pt resist use with new onset cirrhosis- she knows the risks of continuing to use alcohol.  Procedures:  Paracentesis 7/2 - canceled - no large pockets of ascites found on US   Paracentesis 6/27 - Findings: A total of approximately 860 cc's of yellow fluid was removed. A fluid sample was sent for laboratory analysis.   IMPRESSION: Successful ultrasound guided diagnostic and therapeutic paracentesis yielding 860 cc's of ascites.   Consultations:  GI - Dorena Cookey, MD  Discharge Exam: Filed Vitals:   07/04/12 2048 07/04/12 2336 07/05/12 0537 07/05/12 0538  BP: 84/52 93/64 92/61    Pulse: 79  81   Temp: 98.9 F (37.2 C)  99.2 F (37.3 C)   TempSrc: Oral  Oral   Resp: 18  20   Height:      Weight:    64.8 kg (142 lb 13.7 oz)  SpO2: 98%  97%     General: A & O x3, NAD, sitting on edge of bed  Eyes: 2-3 + icteris   Cardiovascular: RRR, no m/g/r, minimal peripheral edema  Respiratory: CTA bilaterally, no wheezes, rales, or rhonchi  Abdomen: BS+, soft, no TTP, hepatomegaly, ascites improved from yesterday, collateral venous channels noted. No fluid thrill or shifting dullness noted. Musculoskeletal: ambulating without difficulty, moving all extremities  Skin: Multiple bruises, 2+ Jaundice  Discharge Instructions      Discharge Orders   Future Orders Complete By Expires     Call MD for:  persistant nausea and vomiting  As directed     Call MD for:  temperature >100.4  As directed     Diet - low sodium heart healthy  As directed     Increase activity slowly  As directed         Medication List    STOP taking these medications       naproxen sodium 220 MG tablet  Commonly known as:  ANAPROX      TAKE these medications       furosemide 40 MG tablet  Commonly known as:  LASIX  Take 1 tablet (40 mg total) by mouth daily.  Start taking on:  07/06/2012     lactulose 10 GM/15ML solution  Commonly known as:  CHRONULAC  Take 15 mLs (10 g total) by mouth daily.     nadolol 20 MG tablet  Commonly known as:  CORGARD  Take 0.5 tablets (10  mg total) by mouth daily.     nicotine 7 mg/24hr patch  Commonly known as:  NICODERM CQ - dosed in mg/24 hr  Place 1 patch onto the skin daily.     potassium chloride 20 MEQ/15ML (10%) solution  Take 15 mLs (20 mEq total) by mouth 2 (two) times daily.     spironolactone 100 MG tablet  Commonly known as:  ALDACTONE  Take 1 tablet (100 mg total) by mouth daily.  Start taking on:  07/06/2012     ursodiol 300 MG capsule  Commonly known as:  ACTIGALL  Take 300 mg by mouth 2 (two) times daily.       No Known Allergies Follow-up Information   Follow up with Shirley Friar., MD On 07/16/2012. (as per patient, appt is already scheduled)    Contact information:   [address], [address] [place]  [address] [phone]       Follow up with [facility], DO On 07/09/2012. (appt at 11:45 am)    Contact information:   [address] [place] [address] [phone]       The results of significant diagnostics from this hospitalization (including imaging, microbiology, ancillary and laboratory) are listed below for reference.    Significant Diagnostic Studies: Ct Head Wo Contrast  06/28/2012   *RADIOLOGY REPORT*  Clinical Data: Blurred vision  CT HEAD WITHOUT CONTRAST  Technique:  Contiguous axial images were obtained from the base of the skull through the vertex without contrast.  Comparison: CT 10/14/2006  Findings: Mild atrophy for age.  Ventricle size is normal. Negative for acute or chronic infarct.  Negative for hemorrhage or mass lesion.  No skull lesion identified.  Air-fluid level in the sphenoid sinus.  IMPRESSION: Mild atrophy.  No acute abnormality.   Original Report Authenticated By: Janeece Riggers, M.D.   Ct Abdomen Pelvis W Contrast  06/29/2012   *RADIOLOGY REPORT*  Clinical Data: Abdominal pain the colon, swelling.  Nausea.  CT ABDOMEN AND PELVIS WITH CONTRAST  Technique:  Multidetector CT imaging of the abdomen and pelvis was performed following the standard protocol during bolus administration of intravenous contrast.  Contrast: 80mL OMNIPAQUE IOHEXOL 300 MG/ML  SOLN  Comparison: MRI 06/06/2012.  Findings: Lung bases are clear.  No effusions.  Heart is normal size. 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  Severe changes of cirrhosis with markedly nodular and shrunken liver surrounding perihepatic and perisplenic ascites.  Moderate ascites in the pelvis.  Gallbladder is mildly distended. Recanalization of the umbilical vein with periumbilical varices.Stomach, pancreas, adrenals and kidneys are unremarkable. Spleen upper limits normal in size.  Scattered calcifications compatible with old granulomatous disease.  Small bowel is decompressed.  Colon is grossly  unremarkable.  IUD is present in the uterus which is otherwise unremarkable.  No adnexal masses.  Aorta is normal caliber.  Scattered upper abdominal lymph nodes, likely related to liver disease.  No adenopathy.  IMPRESSION: Severe changes of cirrhosis.  Moderate associated ascites.  Large periumbilical varices.   Original Report Authenticated By: Charlett Nose, M.D.   US Abdomen Limited  07/04/2012   *RADIOLOGY REPORT*  Clinical Data: Evaluate for ascites and paracentesis.  LIMITED ABDOMEN ULTRASOUND FOR ASCITES  Technique:  Limited ultrasound survey for ascites was performed in all four abdominal quadrants.  Comparison:  06/29/2012  Findings: There is a small amount of fluid in the left lower quadrant.  There may be trace fluid in the right upper quadrant.  A trace amount of fluid in the right lower quadrant.  Liver architecture is ill defined on this examination.  IMPRESSION: Minimal abdominal ascites.  There is not a safe window for paracentesis.   Original Report Authenticated By: Richarda Overlie, M.D.   US Paracentesis  06/29/2012   *RADIOLOGY REPORT*  Clinical Data: Primary sclerosing cholangitis, findings consistent with cirrhosis on CT scan, ascites.  Request is made for diagnostic and therapeutic paracentesis.  ULTRASOUND GUIDED DIAGNOSTIC AND THERAPEUTIC  PARACENTESIS  An ultrasound guided paracentesis was thoroughly discussed with the patient and questions answered.  The benefits, risks, alternatives and complications were also discussed.  The patient understands and wishes to proceed with the procedure.  Written consent was obtained.  Ultrasound was performed to localize and mark an adequate pocket of fluid in the right mid quadrant of the abdomen.  The area was then prepped and draped in the normal sterile fashion.  1% Lidocaine was used for local anesthesia.  Under ultrasound guidance a 19 gauge Yueh catheter was introduced.  Paracentesis was performed.  The catheter was removed and a dressing applied.   Complications:  none  Findings:  A total of approximately 860 cc's of yellow fluid was removed.  A fluid sample was sent for laboratory analysis.  IMPRESSION: Successful ultrasound guided diagnostic and therapeutic paracentesis yielding 860 cc's of ascites.  Read by: Jeananne Rama, P.A.-C   Original Report Authenticated By: Malachy Moan, M.D.   Mr Abd W/wo Cm/mrcp  06/07/2012   *RADIOLOGY REPORT*  Clinical Data:  Sclerosing cholangitis.  Alcohol abuse.  Abdominal discomfort and distention.  MRI ABDOMEN WITHOUT AND WITH CONTRAST  Technique:  Multiplanar multisequence MR imaging of the abdomen was performed both before and after the administration of intravenous contrast.  Contrast: 13mL MULTIHANCE GADOBENATE DIMEGLUMINE 529 MG/ML IV SOLN  Comparison:  03/18/2009  Findings:  Diffuse scarring of the liver with gross distortion of the hepatic architecture appears similar and is consistent with known history of sclerosing cholangitis.  Heterogeneous pattern of hepatic steatosis is seen, however no suspicious liver masses are identified.  There is minimal perihepatic ascites.  Mild diffuse wall thickening is seen involving a nondilated gallbladder, which is consistent with chronic liver disease.  No masses are seen involving the liver or pancreas.  No evidence of biliary ductal dilatation, with common bile duct measuring approximately 3 mm.  There is no evidence of pancreatic ductal dilatation.  Spleen is normal in size in appearance.  Both adrenal glands and kidneys are also normal appearance.  No evidence of hydronephrosis.  No soft tissue masses or lymphadenopathy identified within the abdomen. Recanalization of the periumbilical vessels in the anterior abdominal wall is consistent with portal venous hypertension.  IMPRESSION:  1.  Diffuse hepatic scarring and architectural distortion, consistent with known history of sclerosing cholangitis.  Findings also consistent with portal venous hypertension.  2.  No  evidence of hepatic neoplasm or biliary ductal dilatation.   Original Report Authenticated By: Myles Rosenthal, M.D.    Microbiology: Recent Results (from the past 240 hour(s))  CULTURE, BLOOD (ROUTINE X 2)     Status: None   Collection Time    06/28/12 10:50 PM      Result Value Range Status   Specimen Description BLOOD ARM RIGHT   Final   Special Requests BOTTLES DRAWN AEROBIC AND ANAEROBIC 10CC   Final   Culture  Setup Time 06/29/2012 09:08   Final   Culture NO GROWTH 5 DAYS   Final  Report Status 07/05/2012 FINAL   Final  CULTURE, BLOOD (ROUTINE X 2)     Status: None   Collection Time    06/28/12 11:00 PM      Result Value Range Status   Specimen Description BLOOD HAND RIGHT   Final   Special Requests BOTTLES DRAWN AEROBIC ONLY 10CC   Final   Culture  Setup Time 06/29/2012 09:09   Final   Culture NO GROWTH 5 DAYS   Final   Report Status 07/05/2012 FINAL   Final  BODY FLUID CULTURE     Status: None   Collection Time    06/29/12  4:02 PM      Result Value Range Status   Specimen Description ASCITIC ABDOMEN FLUID   Final   Special Requests FLUID   Final   Gram Stain     Final   Value: MODERATE WBC PRESENT,BOTH PMN AND MONONUCLEAR     NO ORGANISMS SEEN     Performed at [facility]   Culture NO GROWTH 3 DAYS   Final   Report Status 07/03/2012 FINAL   Final  GRAM STAIN     Status: None   Collection Time    06/29/12  4:02 PM      Result Value Range Status   Specimen Description ASCITIC ABDOMEN FLUID   Final   Special Requests FLUID   Final   Gram Stain     Final   Value: MODERATE WBC PRESENT,BOTH PMN AND MONONUCLEAR     NO ORGANISMS SEEN   Report Status 06/29/2012 FINAL   Final     Labs: Basic Metabolic Panel:  Recent Labs Lab 07/02/12 0550 07/03/12 0015 07/03/12 0525 07/04/12 0515 07/05/12 0540  NA 126* 127* 126* 128* 125*  K 4.1 4.4 4.4 4.1 4.6  CL 87* 93* 94* 93* 90*  CO2 29 30 26 27 23   GLUCOSE 118* 120* 97 110* 108*  BUN 5* 5* 5* 9 9   CREATININE 0.51 0.48* 0.50 0.57 0.54  CALCIUM 8.6 8.6 8.3* 8.7 8.5   Liver Function Tests:  Recent Labs Lab 07/02/12 0550 07/03/12 0015 07/03/12 0525 07/04/12 0515 07/05/12 0540  AST 155* 127* 116* 107* 115*  ALT 26 24 22 23 26   ALKPHOS 461* 406* 396* 388* 351*  BILITOT 9.7* 10.4* 9.5* 9.4* 9.2*  PROT 6.8 6.2 6.0 6.3 6.5  ALBUMIN 2.6* 2.2* 2.2* 2.1* 2.2*    Recent Labs Lab 06/28/12 2155 06/29/12 0535  LIPASE 22 17    Recent Labs Lab 06/28/12 2155 06/29/12 0535 06/30/12 1000  AMMONIA 72* 96* 250*   CBC:  Recent Labs Lab 06/28/12 2155  07/01/12 0550 07/02/12 0550 07/03/12 0525 07/04/12 0515 07/05/12 0540  WBC 5.4  < > 6.3 9.8 8.8 9.1 7.8  NEUTROABS 3.7  --   --   --  5.9 5.6  --   HGB 9.7*  < > 9.7* 9.2* 8.8* 9.2* 9.4*  HCT 28.3*  < > 29.5* 27.2* 26.9* 28.1* 28.3*  MCV 92.8  < > 98.3 97.5 99.6 100.4* 99.0  PLT 50*  < > 57* 64* 77* 99* 133*  < > = values in this interval not displayed. CBG:  Recent Labs Lab 06/29/12 1943 06/29/12 2202 06/30/12 0739 06/30/12 1135 06/30/12 1714  GLUCAP 96 80 81 104* 142*       Signed:  Ralene Muskrat PA-S  [facility] 07/05/2012, 11:48 AM  Attending Patient seen and examined, although documentation revealed, agree with the assessment and  plan as outlined above.Patient seems to be doing well in terms of her volume status,has trace bilateral leg edema, no evidence of significant ascites on exam (shifting dullness and Fluid thrill negative). Her BP is soft-but she is asymptomatic, her Na levels have been low for the past no of days-she is also asymptomatic from this-suspect this is 2/2 cirrhosis/paracentesis/Diuretic therapy-she will need close monitoring of her electrolytes in the outpatient setting, appt with PCP has been made for 7/7. She has been extensively counseled regarding dietary limitations, ETOH and Tobacco Cessation and importance of close Na monitoring. She claims understanding, and will be  discharged home at her own request.  S Ghimire

## 2012-07-05 NOTE — Progress Notes (Signed)
[name] to be [name] per MD order.  Discussed with the patient and all questions fully answered.    Medication List    STOP taking these medications       naproxen sodium 220 MG tablet  Commonly known as:  ANAPROX      TAKE these medications       furosemide 40 MG tablet  Commonly known as:  LASIX  Take 1 tablet (40 mg total) by mouth daily.  Start taking on:  07/06/2012     lactulose 10 GM/15ML solution  Commonly known as:  CHRONULAC  Take 15 mLs (10 g total) by mouth daily.     nadolol 20 MG tablet  Commonly known as:  CORGARD  Take 0.5 tablets (10 mg total) by mouth daily.     nicotine 7 mg/24hr patch  Commonly known as:  NICODERM CQ - dosed in mg/24 hr  Place 1 patch onto the skin daily.     potassium chloride 20 MEQ/15ML (10%) solution  Take 15 mLs (20 mEq total) by mouth 2 (two) times daily.     spironolactone 100 MG tablet  Commonly known as:  ALDACTONE  Take 1 tablet (100 mg total) by mouth daily.  Start taking on:  07/06/2012     ursodiol 300 MG capsule  Commonly known as:  ACTIGALL  Take 300 mg by mouth 2 (two) times daily.        VVS, Skin clean, dry and intact without evidence of skin break down, no evidence of skin tears noted. IV catheter discontinued intact. Site without signs and symptoms of complications. Dressing and pressure applied.  An After Visit Summary was printed and given to the patient. Patient escorted via WC, and [facility] home via private auto.  [name] 07/05/2012 11:54 AM

## 2012-07-05 NOTE — Progress Notes (Signed)
[facility] Progress Note  Subjective: Patient sleepy this morning, voices no new complaints. Wants to go home  Objective: Vital signs in last 24 hours: Temp:  [97.6 F (36.4 C)-99.2 F (37.3 C)] 99.2 F (37.3 C) (07/03 0537) Pulse Rate:  [76-81] 81 (07/03 0537) Resp:  [18-20] 20 (07/03 0537) BP: (84-98)/(52-69) 92/61 mmHg (07/03 0537) SpO2:  [96 %-98 %] 97 % (07/03 0537) Weight:  [64.8 kg (142 lb 13.7 oz)] 64.8 kg (142 lb 13.7 oz) (07/03 0538) Weight change: -1.6 kg (-3 lb 8.4 oz)   PE: Abdomen soft  Lab Results: Results for orders placed during the hospital encounter of 06/28/12 (from the past 24 hour(s))  CBC     Status: Abnormal   Collection Time    07/05/12  5:40 AM      Result Value Range   WBC 7.8  4.0 - 10.5 K/uL   RBC 2.86 (*) 3.87 - 5.11 MIL/uL   Hemoglobin 9.4 (*) 12.0 - 15.0 g/dL   HCT 78.2 (*) 95.6 - 21.3 %   MCV 99.0  78.0 - 100.0 fL   MCH 32.9  26.0 - 34.0 pg   MCHC 33.2  30.0 - 36.0 g/dL   RDW 08.6 (*) 57.8 - 46.9 %   Platelets 133 (*) 150 - 400 K/uL    Studies/Results: US Abdomen Limited  07/04/2012   *RADIOLOGY REPORT*  Clinical Data: Evaluate for ascites and paracentesis.  LIMITED ABDOMEN ULTRASOUND FOR ASCITES  Technique:  Limited ultrasound survey for ascites was performed in all four abdominal quadrants.  Comparison:  06/29/2012  Findings: There is a small amount of fluid in the left lower quadrant.  There may be trace fluid in the right upper quadrant.  A trace amount of fluid in the right lower quadrant.  Liver architecture is ill defined on this examination.  IMPRESSION: Minimal abdominal ascites.  There is not a safe window for paracentesis.   Original Report Authenticated By: Richarda Overlie, M.D.      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 Would send home on Lasix 40 mg a day and spironolactone 100 mg a day.  Followup with Dr. school and will probably refer her to [facility] for reassessment for transplant.    Jerlisa Diliberto C 07/05/2012, 7:19 AM

## 2012-08-30 ENCOUNTER — Encounter (HOSPITAL_COMMUNITY): Payer: Self-pay | Admitting: Emergency Medicine

## 2012-08-30 DIAGNOSIS — F17200 Nicotine dependence, unspecified, uncomplicated: Secondary | ICD-10-CM | POA: Diagnosis present

## 2012-08-30 DIAGNOSIS — K7030 Alcoholic cirrhosis of liver without ascites: Principal | ICD-10-CM | POA: Diagnosis present

## 2012-08-30 DIAGNOSIS — R188 Other ascites: Secondary | ICD-10-CM | POA: Diagnosis present

## 2012-08-30 DIAGNOSIS — D696 Thrombocytopenia, unspecified: Secondary | ICD-10-CM | POA: Diagnosis present

## 2012-08-30 DIAGNOSIS — D649 Anemia, unspecified: Secondary | ICD-10-CM | POA: Diagnosis present

## 2012-08-30 DIAGNOSIS — K7290 Hepatic failure, unspecified without coma: Secondary | ICD-10-CM | POA: Diagnosis present

## 2012-08-30 DIAGNOSIS — R45851 Suicidal ideations: Secondary | ICD-10-CM

## 2012-08-30 DIAGNOSIS — K8309 Other cholangitis: Secondary | ICD-10-CM | POA: Diagnosis present

## 2012-08-30 DIAGNOSIS — F410 Panic disorder [episodic paroxysmal anxiety] without agoraphobia: Secondary | ICD-10-CM | POA: Diagnosis present

## 2012-08-30 DIAGNOSIS — Z91199 Patient's noncompliance with other medical treatment and regimen due to unspecified reason: Secondary | ICD-10-CM

## 2012-08-30 DIAGNOSIS — F1020 Alcohol dependence, uncomplicated: Secondary | ICD-10-CM | POA: Diagnosis present

## 2012-08-30 DIAGNOSIS — K7682 Hepatic encephalopathy: Secondary | ICD-10-CM | POA: Diagnosis present

## 2012-08-30 DIAGNOSIS — E876 Hypokalemia: Secondary | ICD-10-CM | POA: Diagnosis present

## 2012-08-30 DIAGNOSIS — F329 Major depressive disorder, single episode, unspecified: Secondary | ICD-10-CM | POA: Diagnosis present

## 2012-08-30 DIAGNOSIS — Z9119 Patient's noncompliance with other medical treatment and regimen: Secondary | ICD-10-CM

## 2012-08-30 DIAGNOSIS — F3289 Other specified depressive episodes: Secondary | ICD-10-CM | POA: Diagnosis present

## 2012-08-30 DIAGNOSIS — N390 Urinary tract infection, site not specified: Secondary | ICD-10-CM | POA: Diagnosis present

## 2012-08-30 LAB — CBC WITH DIFFERENTIAL/PLATELET
Eosinophils Absolute: 0.3 10*3/uL (ref 0.0–0.7)
Eosinophils Relative: 2 % (ref 0–5)
HCT: 25.3 % — ABNORMAL LOW (ref 36.0–46.0)
Lymphs Abs: 1.2 10*3/uL (ref 0.7–4.0)
MCH: 31.1 pg (ref 26.0–34.0)
MCV: 92.7 fL (ref 78.0–100.0)
Monocytes Absolute: 0.9 10*3/uL (ref 0.1–1.0)
Platelets: 149 10*3/uL — ABNORMAL LOW (ref 150–400)
RBC: 2.73 MIL/uL — ABNORMAL LOW (ref 3.87–5.11)
RDW: 19.1 % — ABNORMAL HIGH (ref 11.5–15.5)

## 2012-08-30 LAB — LIPASE, BLOOD: Lipase: 16 U/L (ref 11–59)

## 2012-08-30 LAB — COMPREHENSIVE METABOLIC PANEL
ALT: 25 U/L (ref 0–35)
CO2: 37 mEq/L — ABNORMAL HIGH (ref 19–32)
Calcium: 8.6 mg/dL (ref 8.4–10.5)
Creatinine, Ser: 0.47 mg/dL — ABNORMAL LOW (ref 0.50–1.10)
GFR calc Af Amer: >90 mL/min (ref >90)
GFR calc non Af Amer: >90 mL/min (ref >90)
Glucose, Bld: 82 mg/dL (ref 70–99)
Sodium: 133 mEq/L — ABNORMAL LOW (ref 135–145)
Total Protein: 8 g/dL (ref 6.0–8.3)

## 2012-08-30 NOTE — ED Notes (Signed)
PT. REPORTS PROGRESSING ABDOMINAL DISTENTION / ASCITES FOR THE PAST SEVERAL DAYS WITH BILATERAL LOWER LEGS EDEMA/ SWELLING . RESPIRATIONS UNLABORED / AMBULATORY / ALERT AND ORIENTED , DENIES FEVER OR CHILLS. OCCASIONAL NAUSEA AND VOMITTING .

## 2012-08-31 ENCOUNTER — Inpatient Hospital Stay (HOSPITAL_COMMUNITY): Payer: Medicaid Other

## 2012-08-31 ENCOUNTER — Inpatient Hospital Stay (HOSPITAL_COMMUNITY)
Admission: EM | Admit: 2012-08-31 | Discharge: 2012-09-05 | DRG: 432 | Disposition: A | Discharge status: Home / Self Care | Payer: Medicaid Other | Attending: Family Medicine | Admitting: Family Medicine

## 2012-08-31 ENCOUNTER — Encounter (HOSPITAL_COMMUNITY): Payer: Self-pay | Admitting: Emergency Medicine

## 2012-08-31 DIAGNOSIS — K759 Inflammatory liver disease, unspecified: Secondary | ICD-10-CM

## 2012-08-31 DIAGNOSIS — K7290 Hepatic failure, unspecified without coma: Secondary | ICD-10-CM | POA: Diagnosis present

## 2012-08-31 DIAGNOSIS — F329 Major depressive disorder, single episode, unspecified: Secondary | ICD-10-CM

## 2012-08-31 DIAGNOSIS — R45851 Suicidal ideations: Secondary | ICD-10-CM

## 2012-08-31 DIAGNOSIS — R188 Other ascites: Secondary | ICD-10-CM

## 2012-08-31 DIAGNOSIS — R609 Edema, unspecified: Secondary | ICD-10-CM

## 2012-08-31 DIAGNOSIS — F1010 Alcohol abuse, uncomplicated: Secondary | ICD-10-CM

## 2012-08-31 DIAGNOSIS — F1020 Alcohol dependence, uncomplicated: Secondary | ICD-10-CM | POA: Diagnosis present

## 2012-08-31 DIAGNOSIS — K8301 Primary sclerosing cholangitis: Secondary | ICD-10-CM | POA: Diagnosis present

## 2012-08-31 DIAGNOSIS — R109 Unspecified abdominal pain: Secondary | ICD-10-CM

## 2012-08-31 DIAGNOSIS — K7460 Unspecified cirrhosis of liver: Secondary | ICD-10-CM

## 2012-08-31 DIAGNOSIS — K8309 Other cholangitis: Secondary | ICD-10-CM

## 2012-08-31 DIAGNOSIS — R509 Fever, unspecified: Secondary | ICD-10-CM

## 2012-08-31 DIAGNOSIS — F32A Depression, unspecified: Secondary | ICD-10-CM | POA: Diagnosis present

## 2012-08-31 DIAGNOSIS — Z8719 Personal history of other diseases of the digestive system: Secondary | ICD-10-CM

## 2012-08-31 DIAGNOSIS — D649 Anemia, unspecified: Secondary | ICD-10-CM

## 2012-08-31 DIAGNOSIS — K7682 Hepatic encephalopathy: Secondary | ICD-10-CM | POA: Diagnosis present

## 2012-08-31 DIAGNOSIS — G9340 Encephalopathy, unspecified: Secondary | ICD-10-CM

## 2012-08-31 DIAGNOSIS — R17 Unspecified jaundice: Secondary | ICD-10-CM | POA: Diagnosis present

## 2012-08-31 DIAGNOSIS — D696 Thrombocytopenia, unspecified: Secondary | ICD-10-CM

## 2012-08-31 DIAGNOSIS — N390 Urinary tract infection, site not specified: Secondary | ICD-10-CM | POA: Diagnosis present

## 2012-08-31 DIAGNOSIS — F0631 Mood disorder due to known physiological condition with depressive features: Secondary | ICD-10-CM

## 2012-08-31 HISTORY — DX: Alcohol dependence, uncomplicated: F10.20

## 2012-08-31 HISTORY — DX: Other ascites: R18.8

## 2012-08-31 HISTORY — DX: Family history of other specified conditions: Z84.89

## 2012-08-31 HISTORY — DX: Unspecified cirrhosis of liver: K74.60

## 2012-08-31 LAB — CBC WITH DIFFERENTIAL/PLATELET
Basophils Relative: 0 % (ref 0–1)
HCT: 21.5 % — ABNORMAL LOW (ref 36.0–46.0)
Hemoglobin: 7.2 g/dL — ABNORMAL LOW (ref 12.0–15.0)
Lymphocytes Relative: 10 % — ABNORMAL LOW (ref 12–46)
Lymphs Abs: 0.8 10*3/uL (ref 0.7–4.0)
MCHC: 33.5 g/dL (ref 30.0–36.0)
Monocytes Absolute: 0.7 10*3/uL (ref 0.1–1.0)
Monocytes Relative: 8 % (ref 3–12)
Neutro Abs: 6.5 10*3/uL (ref 1.7–7.7)
Neutrophils Relative %: 80 % — ABNORMAL HIGH (ref 43–77)
RBC: 2.32 MIL/uL — ABNORMAL LOW (ref 3.87–5.11)

## 2012-08-31 LAB — PROTIME-INR
INR: 1.51 — ABNORMAL HIGH (ref 0.00–1.49)
Prothrombin Time: 17.8 seconds — ABNORMAL HIGH (ref 11.6–15.2)

## 2012-08-31 LAB — POCT PREGNANCY, URINE: Preg Test, Ur: NEGATIVE

## 2012-08-31 LAB — URINALYSIS, ROUTINE W REFLEX MICROSCOPIC
Nitrite: NEGATIVE
Protein, ur: NEGATIVE mg/dL
Specific Gravity, Urine: 1.012 (ref 1.005–1.030)
Urobilinogen, UA: >8 mg/dL — ABNORMAL HIGH (ref 0.0–1.0)

## 2012-08-31 LAB — BASIC METABOLIC PANEL
BUN: 6 mg/dL (ref 6–23)
CO2: 30 mEq/L (ref 19–32)
CO2: 29 mEq/L (ref 19–32)
Calcium: 8.2 mg/dL — ABNORMAL LOW (ref 8.4–10.5)
Chloride: 85 mEq/L — ABNORMAL LOW (ref 96–112)
Creatinine, Ser: 0.38 mg/dL — ABNORMAL LOW (ref 0.50–1.10)
GFR calc non Af Amer: >90 mL/min (ref >90)
Glucose, Bld: 61 mg/dL — ABNORMAL LOW (ref 70–99)
Glucose, Bld: 121 mg/dL — ABNORMAL HIGH (ref 70–99)
Potassium: 2.5 mEq/L — CL (ref 3.5–5.1)
Potassium: 3.1 mEq/L — ABNORMAL LOW (ref 3.5–5.1)
Sodium: 130 mEq/L — ABNORMAL LOW (ref 135–145)

## 2012-08-31 LAB — LACTATE DEHYDROGENASE, PLEURAL OR PERITONEAL FLUID: LD, Fluid: 37 U/L — ABNORMAL HIGH (ref 3–23)

## 2012-08-31 LAB — HEPATIC FUNCTION PANEL
ALT: 20 U/L (ref 0–35)
AST: 109 U/L — ABNORMAL HIGH (ref 0–37)
Indirect Bilirubin: 2.4 mg/dL — ABNORMAL HIGH (ref 0.3–0.9)
Total Protein: 6.6 g/dL (ref 6.0–8.3)

## 2012-08-31 LAB — GRAM STAIN

## 2012-08-31 LAB — RAPID URINE DRUG SCREEN, HOSP PERFORMED
Barbiturates: NOT DETECTED
Tetrahydrocannabinol: NOT DETECTED

## 2012-08-31 LAB — URINE MICROSCOPIC-ADD ON

## 2012-08-31 LAB — BODY FLUID CELL COUNT WITH DIFFERENTIAL

## 2012-08-31 LAB — PROTEIN, BODY FLUID: Total protein, fluid: 1.2 g/dL

## 2012-08-31 LAB — MAGNESIUM: Magnesium: 1.3 mg/dL — ABNORMAL LOW (ref 1.5–2.5)

## 2012-08-31 MED ORDER — LACTULOSE ENEMA
300.0000 mL | Freq: Two times a day (BID) | ORAL | Status: DC
  Filled 2012-08-31 (×3): qty 300

## 2012-08-31 MED ORDER — THIAMINE HCL 100 MG/ML IJ SOLN
100.0000 mg | Freq: Every day | INTRAMUSCULAR | Status: DC
  Filled 2012-08-31: qty 1

## 2012-08-31 MED ORDER — POTASSIUM CHLORIDE CRYS ER 20 MEQ PO TBCR
40.0000 meq | EXTENDED_RELEASE_TABLET | Freq: Once | ORAL | Status: DC
  Filled 2012-08-31 (×2): qty 2

## 2012-08-31 MED ORDER — POTASSIUM CHLORIDE CRYS ER 20 MEQ PO TBCR
40.0000 meq | EXTENDED_RELEASE_TABLET | ORAL | Status: AC
  Administered 2012-08-31 (×3): 40 meq via ORAL
  Filled 2012-08-31 (×2): qty 2

## 2012-08-31 MED ORDER — FOLIC ACID 1 MG PO TABS
1.0000 mg | ORAL_TABLET | Freq: Every day | ORAL | Status: DC
  Administered 2012-08-31 – 2012-09-01 (×2): 1 mg via ORAL
  Filled 2012-08-31 (×2): qty 1

## 2012-08-31 MED ORDER — SODIUM CHLORIDE 0.9 % IJ SOLN
3.0000 mL | Freq: Two times a day (BID) | INTRAMUSCULAR | Status: DC
  Administered 2012-09-01 – 2012-09-04 (×6): 3 mL via INTRAVENOUS

## 2012-08-31 MED ORDER — ACETAMINOPHEN 650 MG RE SUPP: 650.0000 mg | Freq: Four times a day (QID) | RECTAL | Status: DC | PRN

## 2012-08-31 MED ORDER — BACLOFEN 20 MG PO TABS
20.0000 mg | ORAL_TABLET | Freq: Every day | ORAL | Status: DC | PRN
  Filled 2012-08-31 (×2): qty 1

## 2012-08-31 MED ORDER — LACTULOSE 10 GM/15ML PO SOLN
30.0000 g | Freq: Three times a day (TID) | ORAL | Status: DC
  Administered 2012-08-31 – 2012-09-01 (×3): 30 g via ORAL
  Filled 2012-08-31 (×5): qty 45

## 2012-08-31 MED ORDER — ONDANSETRON HCL 4 MG PO TABS
4.0000 mg | ORAL_TABLET | Freq: Four times a day (QID) | ORAL | Status: DC | PRN
  Administered 2012-09-05: 4 mg via ORAL
  Filled 2012-08-31: qty 1

## 2012-08-31 MED ORDER — ONDANSETRON HCL 4 MG/2ML IJ SOLN
4.0000 mg | Freq: Four times a day (QID) | INTRAMUSCULAR | Status: DC | PRN
  Administered 2012-09-02: 4 mg via INTRAVENOUS
  Filled 2012-08-31: qty 2

## 2012-08-31 MED ORDER — RIFAXIMIN 550 MG PO TABS
550.0000 mg | ORAL_TABLET | Freq: Two times a day (BID) | ORAL | Status: DC
  Administered 2012-08-31 – 2012-09-05 (×11): 550 mg via ORAL
  Filled 2012-08-31 (×14): qty 1

## 2012-08-31 MED ORDER — LACTULOSE 10 GM/15ML PO SOLN
10.0000 g | Freq: Three times a day (TID) | ORAL | Status: DC
  Administered 2012-08-31: 10 g via ORAL
  Filled 2012-08-31 (×3): qty 15

## 2012-08-31 MED ORDER — LORAZEPAM 2 MG/ML IJ SOLN
1.0000 mg | Freq: Four times a day (QID) | INTRAMUSCULAR | Status: DC | PRN
  Filled 2012-08-31 (×2): qty 1

## 2012-08-31 MED ORDER — SPIRONOLACTONE 100 MG PO TABS
100.0000 mg | ORAL_TABLET | Freq: Every day | ORAL | Status: DC
  Administered 2012-08-31 – 2012-09-05 (×6): 100 mg via ORAL
  Filled 2012-08-31 (×6): qty 1

## 2012-08-31 MED ORDER — MAGNESIUM SULFATE 40 MG/ML IJ SOLN
4.0000 g | Freq: Once | INTRAMUSCULAR | Status: AC
  Administered 2012-08-31: 4 g via INTRAVENOUS
  Filled 2012-08-31: qty 100

## 2012-08-31 MED ORDER — DEXTROSE 5 % IV SOLN
1.0000 g | Freq: Three times a day (TID) | INTRAVENOUS | Status: DC
  Administered 2012-08-31 – 2012-09-03 (×10): 1 g via INTRAVENOUS
  Filled 2012-08-31 (×14): qty 1

## 2012-08-31 MED ORDER — NADOLOL 20 MG PO TABS
10.0000 mg | ORAL_TABLET | Freq: Every day | ORAL | Status: DC
  Administered 2012-08-31 – 2012-09-05 (×6): 10 mg via ORAL
  Filled 2012-08-31 (×6): qty 1

## 2012-08-31 MED ORDER — ALBUMIN HUMAN 25 % IV SOLN
25.0000 g | Freq: Once | INTRAVENOUS | Status: DC
  Filled 2012-08-31: qty 100

## 2012-08-31 MED ORDER — LORAZEPAM 1 MG PO TABS: 1.0000 mg | ORAL_TABLET | Freq: Four times a day (QID) | ORAL | Status: DC | PRN

## 2012-08-31 MED ORDER — TRAZODONE HCL 100 MG PO TABS
100.0000 mg | ORAL_TABLET | Freq: Every day | ORAL | Status: DC
  Administered 2012-08-31: 100 mg via ORAL
  Filled 2012-08-31 (×2): qty 1

## 2012-08-31 MED ORDER — POTASSIUM CHLORIDE 20 MEQ/15ML (10%) PO LIQD
20.0000 meq | Freq: Two times a day (BID) | ORAL | Status: DC
  Administered 2012-08-31 – 2012-09-05 (×10): 20 meq via ORAL
  Filled 2012-08-31 (×12): qty 15

## 2012-08-31 MED ORDER — ACETAMINOPHEN 325 MG PO TABS
650.0000 mg | ORAL_TABLET | Freq: Four times a day (QID) | ORAL | Status: DC | PRN
  Filled 2012-08-31: qty 2

## 2012-08-31 MED ORDER — URSODIOL 300 MG PO CAPS
300.0000 mg | ORAL_CAPSULE | Freq: Two times a day (BID) | ORAL | Status: DC
  Administered 2012-08-31 – 2012-09-05 (×11): 300 mg via ORAL
  Filled 2012-08-31 (×12): qty 1

## 2012-08-31 MED ORDER — LORAZEPAM 2 MG/ML IJ SOLN: 0.0000 mg | Freq: Two times a day (BID) | INTRAMUSCULAR | Status: DC

## 2012-08-31 MED ORDER — VITAMIN B-1 100 MG PO TABS
100.0000 mg | ORAL_TABLET | Freq: Every day | ORAL | Status: DC
  Administered 2012-08-31 – 2012-09-01 (×2): 100 mg via ORAL
  Filled 2012-08-31 (×2): qty 1

## 2012-08-31 MED ORDER — SODIUM CHLORIDE 0.9 % IJ SOLN: 3.0000 mL | Freq: Two times a day (BID) | INTRAMUSCULAR | Status: DC

## 2012-08-31 MED ORDER — FUROSEMIDE 40 MG PO TABS
40.0000 mg | ORAL_TABLET | Freq: Every day | ORAL | Status: DC
  Administered 2012-08-31 – 2012-09-01 (×2): 40 mg via ORAL
  Filled 2012-08-31 (×2): qty 1

## 2012-08-31 MED ORDER — LORAZEPAM 2 MG/ML IJ SOLN
0.0000 mg | Freq: Four times a day (QID) | INTRAMUSCULAR | Status: DC
  Administered 2012-09-01: 2 mg via INTRAVENOUS
  Administered 2012-09-01: 1 mg via INTRAVENOUS

## 2012-08-31 MED ORDER — PNEUMOCOCCAL VAC POLYVALENT 25 MCG/0.5ML IJ INJ
0.5000 mL | INJECTION | INTRAMUSCULAR | Status: AC
  Filled 2012-08-31: qty 0.5

## 2012-08-31 MED ORDER — LACTULOSE 10 GM/15ML PO SOLN
10.0000 g | Freq: Three times a day (TID) | ORAL | Status: DC
  Filled 2012-08-31 (×3): qty 15

## 2012-08-31 MED ORDER — POTASSIUM CHLORIDE 10 MEQ/100ML IV SOLN
10.0000 meq | INTRAVENOUS | Status: AC
  Administered 2012-08-31 (×2): 10 meq via INTRAVENOUS
  Filled 2012-08-31 (×2): qty 100

## 2012-08-31 MED ORDER — ADULT MULTIVITAMIN W/MINERALS CH
1.0000 | ORAL_TABLET | Freq: Every day | ORAL | Status: DC
  Administered 2012-08-31 – 2012-09-05 (×6): 1 via ORAL
  Filled 2012-08-31 (×6): qty 1

## 2012-08-31 NOTE — Procedures (Signed)
Successful US guided paracentesis from RUQ.  Yielded 170cc of yellow fluid.  No immediate complications.  Pt tolerated well.   Specimen was sent for labs.  Pattricia Boss D PA-C 08/31/2012 3:21 PM

## 2012-08-31 NOTE — H&P (Signed)
[facility] History and Physical  Aliene Tamura BJY:[id number] DOB: [birth year] DOA: 08/31/2012  Referring physician: ER physician. PCP: No primary provider on file.  Specialists: Dr. Bosie Clos. Gastroenterologist.  Chief Complaint: Abdominal distention and lower extremity edema.  HPI: [name] is a 42 y.o. female with history of primary sclerosing cholangitis, colitis presents with increasing abdominal distal channel lower extremity edema. Patient states she's compliant with her medications. In addition patient's alcohol levels were found to be high. Patient denies any fever chills chest pain or shortness of breath. At this time patient has been admitted for decompensated cirrhosis of liver. Patient also alcohol intoxicated. On exam patient does have distended abdomen and 3+ lower extremity edema. Not sure if patient is compliant with her medications. Patient was just admitted 2 months ago and had paracentesis. Patient denies any nausea vomiting or diarrhea.  Review of Systems: As presented in the history of presenting illness, rest negative.  Past Medical History  Diagnosis Date  . Sclerosing cholangitis   . Panic attacks   . Anxiety   . Alcohol abuse   . Ascites   . Cirrhosis of liver    Past Surgical History  Procedure Laterality Date  . None    . Ercp     Social History:  reports that she has been smoking.  She does not have any smokeless tobacco history on file. She reports that  drinks alcohol. She reports that she does not use illicit drugs. Home. where does patient live-- Can do ADLs. Can patient participate in ADLs?  No Known Allergies  Family History  Problem Relation Age of Onset  . CAD Father       Prior to Admission medications   Medication Sig Start Date End Date Taking? Authorizing Provider  baclofen (LIORESAL) 20 MG tablet Take 20 mg by mouth as needed.   Yes Historical Provider, MD  furosemide (LASIX) 40 MG tablet Take 1 tablet (40 mg total) by mouth  daily. 07/06/12  Yes Shanker Levora Dredge, MD  lactulose (CHRONULAC) 10 GM/15ML solution Take 15 mLs (10 g total) by mouth daily. 07/05/12  Yes Shanker Levora Dredge, MD  levonorgestrel (MIRENA) 20 MCG/24HR IUD 1 each by Intrauterine route once.   Yes Historical Provider, MD  nadolol (CORGARD) 20 MG tablet Take 0.5 tablets (10 mg total) by mouth daily. 07/05/12  Yes Shanker Levora Dredge, MD  nicotine (NICODERM CQ - DOSED IN MG/24 HR) 7 mg/24hr patch Place 1 patch onto the skin daily. 07/05/12  Yes Shanker Levora Dredge, MD  potassium chloride 20 MEQ/15ML (10%) solution Take 15 mLs (20 mEq total) by mouth 2 (two) times daily. 07/05/12  Yes Shanker Levora Dredge, MD  spironolactone (ALDACTONE) 100 MG tablet Take 1 tablet (100 mg total) by mouth daily. 07/06/12  Yes Shanker Levora Dredge, MD  traZODone (DESYREL) 100 MG tablet Take 100 mg by mouth at bedtime.   Yes Historical Provider, MD  ursodiol (ACTIGALL) 300 MG capsule Take 300 mg by mouth 2 (two) times daily.   Yes Historical Provider, MD   Physical Exam: Filed Vitals:   08/30/12 2129 08/31/12 0101 08/31/12 0311 08/31/12 0509  BP: 114/58 99/50 101/55 105/53  Pulse: 92 83 91 94  Temp: 98.1 F (36.7 C)     TempSrc: Oral     Resp: 14 22  14   SpO2: 96% 100% 100% 98%     General:  Well-developed and moderately nourished.  Eyes: icteric present. No pallor.  ENT: no discharge from the ears eyes nose  mouth.  Neck: no mass felt.  Cardiovascular: S1-S2 heard.  Respiratory: no rhonchi or crepitations.  Abdomen: distended nontender bowel sounds present. No guarding rigidity.  Skin: no rash.  Musculoskeletal: 3+ bilateral lower extremity edema.  Psychiatric: presently alert awake and oriented.  Neurologic: follows commands. Moves all extremities.  Labs on Admission:  Basic Metabolic Panel:  Recent Labs Lab 08/30/12 2133  NA 133*  K 2.6*  CL 82*  CO2 37*  GLUCOSE 82  BUN 6  CREATININE 0.47*  CALCIUM 8.6   Liver Function Tests:  Recent Labs Lab  08/30/12 2133  AST 135*  ALT 25  ALKPHOS 282*  BILITOT 7.7*  PROT 8.0  ALBUMIN 2.8*    Recent Labs Lab 08/30/12 2133  LIPASE 16    Recent Labs Lab 08/31/12 0116  AMMONIA 77*   CBC:  Recent Labs Lab 08/30/12 2133  WBC 12.2*  NEUTROABS 9.8*  HGB 8.5*  HCT 25.3*  MCV 92.7  PLT 149*   Cardiac Enzymes: No results found for this basename: CKTOTAL, CKMB, CKMBINDEX, TROPONINI,  in the last 168 hours  BNP (last 3 results) No results found for this basename: PROBNP,  in the last 8760 hours CBG: No results found for this basename: GLUCAP,  in the last 168 hours  Radiological Exams on Admission: No results found.   Assessment/Plan Principal Problem:   Ascites Active Problems:   Jaundice   Primary sclerosing cholangitis   Edema   1. Decompensated cirrhosis of liver with known history of primary sclerosing cholangitis and active alcohol abuse - at this time not sure if patient is compliant with her medications. I have placed patient empirically on cefotaxime for possible SBP until we get paracentesis done. Continue with present dose of diuretics and closely follow intake output and may need to adjust and increase diuretics based on patient's clinical condition and metabolic panel. I have also increased patient's lactulose dose. Closely follow ammonia levels and clinical status.Follow INR.consult GI for further recommendations. 2. Alcohol abuse - patient has been advised to quit alcoholism. Patient has been placed on alcohol withdrawal protocol using IV Ativan. Thiamine. 3. Hypokalemia - probably secondary to poor oral intake and also possibly secondary to diuretics. Replace and recheck. 4. Anemia - closely follow CBC. 5. History of colitis - denies any diet this time.    Code Status: full code.  Family Communication: none.  Disposition Plan: admit to inpatient.    KAKRAKANDY,ARSHAD N. [facility] Pager [phone].  If 7PM-7AM, please contact  night-coverage [email or web address] Password [facility] 08/31/2012, 5:42 AM

## 2012-08-31 NOTE — Progress Notes (Signed)
Patient's home medications counted and taken to main pharmacy with patient's consent.

## 2012-08-31 NOTE — Progress Notes (Signed)
UR COMPLETED  

## 2012-08-31 NOTE — ED Provider Notes (Signed)
CSN: [id number]     Arrival date & time 08/30/12  2113 History   First MD Initiated Contact with Patient 08/31/12 0017     Chief Complaint  Patient presents with  . Ascites   (Consider location/radiation/quality/duration/timing/severity/associated sxs/prior Treatment) HPI 42 yo female presents to the ER from home with complaint of increased swelling in legs, abd and shortness of breath with exertion over the last 2-3 days.  Pt has h/o primary sclerosing cholangitis, ascites, cirrhosis.  She has required paracentesis last month.  She denies fevers, chills, n/v/d.  She denies recent ETOH.  She is trying to get through "the hoops that [facility] has set up for me" to get liver tx.  Pt reports she is followed by [facility] and Dr Clovis Riley.  She has not contacted them about her increased swelling.  She reports she is sticking to her low fluid diet, is taking all medications as prescribed.  Pt is somnolent, reports she took a trazadone prescribed to her "by another doctor not listed on my record", because "I'm exhausted and I can't sleep"   Past Medical History  Diagnosis Date  . Sclerosing cholangitis   . Panic attacks   . Anxiety   . Alcohol abuse   . Ascites    Past Surgical History  Procedure Laterality Date  . None    . Ercp     Family History  Problem Relation Age of Onset  . CAD Father    History  Substance Use Topics  . Smoking status: Current Every Day Smoker -- 1.00 packs/day  . Smokeless tobacco: Not on file  . Alcohol Use: Yes     Comment: Pt drinks vodka/liquor every weekend, denies daily drinking   OB History   Grav Para Term Preterm Abortions TAB SAB Ect Mult Living                 Review of Systems  Unable to perform ROS: Other  Pt declines  Allergies  Review of patient's allergies indicates no known allergies.  Home Medications   Current Outpatient Rx  Name  Route  Sig  Dispense  Refill  . baclofen (LIORESAL) 20 MG tablet   Oral   Take 20 mg by mouth as  needed.         . furosemide (LASIX) 40 MG tablet   Oral   Take 1 tablet (40 mg total) by mouth daily.   30 tablet   0   . lactulose (CHRONULAC) 10 GM/15ML solution   Oral   Take 15 mLs (10 g total) by mouth daily.   240 mL   0   . levonorgestrel (MIRENA) 20 MCG/24HR IUD   Intrauterine   1 each by Intrauterine route once.         . nadolol (CORGARD) 20 MG tablet   Oral   Take 0.5 tablets (10 mg total) by mouth daily.   30 tablet   0   . nicotine (NICODERM CQ - DOSED IN MG/24 HR) 7 mg/24hr patch   Transdermal   Place 1 patch onto the skin daily.   28 patch   0   . potassium chloride 20 MEQ/15ML (10%) solution   Oral   Take 15 mLs (20 mEq total) by mouth 2 (two) times daily.   500 mL   0   . spironolactone (ALDACTONE) 100 MG tablet   Oral   Take 1 tablet (100 mg total) by mouth daily.   30 tablet   0   .  traZODone (DESYREL) 100 MG tablet   Oral   Take 100 mg by mouth at bedtime.         . ursodiol (ACTIGALL) 300 MG capsule   Oral   Take 300 mg by mouth 2 (two) times daily.          BP 101/55  Pulse 91  Temp(Src) 98.1 F (36.7 C) (Oral)  Resp 22  SpO2 100% Physical Exam  Constitutional: She is oriented to person, place, and time.  Chronically ill appearing  HENT:  Head: Normocephalic and atraumatic.  Nose: Nose normal.  Mouth/Throat: Oropharynx is clear and moist.  Eyes: Conjunctivae and EOM are normal. Pupils are equal, round, and reactive to light. Scleral icterus is present.  Neck: Normal range of motion. Neck supple. No JVD present. No tracheal deviation present. No thyromegaly present.  Cardiovascular: Normal rate, regular rhythm, normal heart sounds and intact distal pulses.  Exam reveals no gallop and no friction rub.   No murmur heard. Pulmonary/Chest: Effort normal and breath sounds normal. No stridor. No respiratory distress. She has no wheezes. She has no rales. She exhibits no tenderness.  Abdominal: Soft. Bowel sounds are normal.  She exhibits no mass. Distention: +fluid wave, distended but not tight, diffuse tenderness. There is no tenderness. There is no rebound and no guarding.  Musculoskeletal: Normal range of motion. She exhibits edema (2+ pitting edema to thighs). She exhibits no tenderness.  Lymphadenopathy:    She has no cervical adenopathy.  Neurological: She is alert and oriented to person, place, and time. She exhibits normal muscle tone. Coordination normal.  No asterixis noted  Skin: Skin is warm and dry. No rash noted. No erythema. No pallor.  Spider hemangiomas noted  Psychiatric: She has a normal mood and affect. Her behavior is normal. Judgment and thought content normal.    ED Course  Procedures (including critical care time) Labs Review Labs Reviewed  CBC WITH DIFFERENTIAL - Abnormal; Notable for the following:    WBC 12.2 (*)    RBC 2.73 (*)    Hemoglobin 8.5 (*)    HCT 25.3 (*)    RDW 19.1 (*)    Platelets 149 (*)    Neutrophils Relative % 80 (*)    Neutro Abs 9.8 (*)    Lymphocytes Relative 10 (*)    All other components within normal limits  COMPREHENSIVE METABOLIC PANEL - Abnormal; Notable for the following:    Sodium 133 (*)    Potassium 2.6 (*)    Chloride 82 (*)    CO2 37 (*)    Creatinine, Ser 0.47 (*)    Albumin 2.8 (*)    AST 135 (*)    Alkaline Phosphatase 282 (*)    Total Bilirubin 7.7 (*)    All other components within normal limits  URINALYSIS, ROUTINE W REFLEX MICROSCOPIC - Abnormal; Notable for the following:    Color, Urine ORANGE (*)    APPearance CLOUDY (*)    Hgb urine dipstick MODERATE (*)    Bilirubin Urine LARGE (*)    Ketones, ur 15 (*)    Urobilinogen, UA >8.0 (*)    Leukocytes, UA LARGE (*)    All other components within normal limits  PROTIME-INR - Abnormal; Notable for the following:    Prothrombin Time 17.8 (*)    INR 1.51 (*)    All other components within normal limits  ETHANOL - Abnormal; Notable for the following:    Alcohol, Ethyl (B) 320  (*)  All other components within normal limits  AMMONIA - Abnormal; Notable for the following:    Ammonia 77 (*)    All other components within normal limits  URINE MICROSCOPIC-ADD ON - Abnormal; Notable for the following:    Squamous Epithelial / LPF MANY (*)    Bacteria, UA FEW (*)    All other components within normal limits  URINE CULTURE  GRAM STAIN  BODY FLUID CULTURE  LIPASE, BLOOD  URINE RAPID DRUG SCREEN (HOSP PERFORMED)  BODY FLUID CELL COUNT WITH DIFFERENTIAL  PROTEIN, BODY FLUID  LACTATE DEHYDROGENASE, BODY FLUID  HEPATIC FUNCTION PANEL  CBC WITH DIFFERENTIAL  BASIC METABOLIC PANEL  POCT PREGNANCY, URINE   Imaging Review No results found.  MDM   1. Ascites   2. Alcohol abuse   3. Peripheral edema    42 yo female with chronic liver failure.  Pt noted to have elevated alcohol level.  Initially denied drinking, but then admits to falling off the wagon "several days ago".  This is probably the cause of the increased swelling.  Pt with hypokalemia, elevated wbc, unknown etiology.  Will d/w hospitalist for admission for worsening edema in light of hypokalemia, possible therapuetic tap.   Olivia Mackie, MD 08/31/12 [phone]

## 2012-08-31 NOTE — Progress Notes (Signed)
I have seen and assessed patient and agree with Dr Katherene Ponto assessment and plan. Patient jaundiced and lethargic likely with hepatic encephalopathy. Will change lactulose to 30g TID and add xifaxin. Replete potassium and magnesium. GI consultation. Follow.

## 2012-08-31 NOTE — Progress Notes (Signed)
CRITICAL VALUE ALERT  Critical value received: Potassium 2.5  Date of notification:  08/31/12  Time of notification:  0900  Critical value read back:yes  Nurse who received alert:  Lavell Anchors, RN  MD notified (1st page):  Janee Morn  Time of first page:  0905  MD notified (2nd page):  Time of second page:  Responding MD: Janee Morn  Time MD responded:  [phone]

## 2012-08-31 NOTE — Consult Note (Signed)
Referring Provider: Dr. Ramiro Harvest ([facility]) Primary Care Physician:  Gabriel Cirri, DO Primary Gastroenterologist:  Dr. Charlott Rakes  Reason for Consultation:  Cirrhosis and ascites with encephalopathy  HPI: [name] is a 42 y.o. female followed by Dr. Doy Mince because of cirrhosis related to primary sclerosing cholangitis and alcohol abuse. He last saw her about a month ago in the office.  The main clinical manifestations in this patient have been jaundice, encephalopathy, and ascites. She was admitted today because of progressive lethargy which seems to have been a problem particularly in the past several days. She admits to being noncompliant with her lactulose at home.  The patient's current weight is similar to what she weighed in the office a month ago.  No apparent GI bleeding, no obvious infection.   The patient apparently continues to smoke and drink.     Past Medical History  Diagnosis Date  . Sclerosing cholangitis   . Panic attacks   . Anxiety   . Alcohol abuse   . Ascites   . Cirrhosis of liver   . EtOH dependence 08/31/2012  . Family history of anesthesia complication     " my son had problems "    Past Surgical History  Procedure Laterality Date  . None    . Ercp      Prior to Admission medications   Medication Sig Start Date End Date Taking? Authorizing Provider  baclofen (LIORESAL) 20 MG tablet Take 20 mg by mouth as needed.   Yes Historical Provider, MD  furosemide (LASIX) 40 MG tablet Take 1 tablet (40 mg total) by mouth daily. 07/06/12  Yes Shanker Levora Dredge, MD  lactulose (CHRONULAC) 10 GM/15ML solution Take 15 mLs (10 g total) by mouth daily. 07/05/12  Yes Shanker Levora Dredge, MD  levonorgestrel (MIRENA) 20 MCG/24HR IUD 1 each by Intrauterine route once.   Yes Historical Provider, MD  nadolol (CORGARD) 20 MG tablet Take 0.5 tablets (10 mg total) by mouth daily. 07/05/12  Yes Shanker Levora Dredge, MD  nicotine (NICODERM CQ - DOSED IN  MG/24 HR) 7 mg/24hr patch Place 1 patch onto the skin daily. 07/05/12  Yes Shanker Levora Dredge, MD  potassium chloride 20 MEQ/15ML (10%) solution Take 15 mLs (20 mEq total) by mouth 2 (two) times daily. 07/05/12  Yes Shanker Levora Dredge, MD  spironolactone (ALDACTONE) 100 MG tablet Take 1 tablet (100 mg total) by mouth daily. 07/06/12  Yes Shanker Levora Dredge, MD  traZODone (DESYREL) 100 MG tablet Take 100 mg by mouth at bedtime.   Yes Historical Provider, MD  ursodiol (ACTIGALL) 300 MG capsule Take 300 mg by mouth 2 (two) times daily.   Yes Historical Provider, MD    Current Facility-Administered Medications  Medication Dose Route Frequency Provider Last Rate Last Dose  . acetaminophen (TYLENOL) tablet 650 mg  650 mg Oral Q6H PRN Eduard Clos, MD       Or  . acetaminophen (TYLENOL) suppository 650 mg  650 mg Rectal Q6H PRN Eduard Clos, MD      . baclofen (LIORESAL) tablet 20 mg  20 mg Oral Daily PRN Eduard Clos, MD      . cefoTAXime (CLAFORAN) 1 g in dextrose 5 % 50 mL IVPB  1 g Intravenous Q8H Eduard Clos, MD   1 g at 08/31/12 1340  . folic acid (FOLVITE) tablet 1 mg  1 mg Oral Daily Eduard Clos, MD   1 mg at 08/31/12 4098  . furosemide (  LASIX) tablet 40 mg  40 mg Oral Daily Eduard Clos, MD   40 mg at 08/31/12 1056  . lactulose (CHRONULAC) 10 GM/15ML solution 30 g  30 g Oral TID Rodolph Bong, MD   30 g at 08/31/12 1639  . LORazepam (ATIVAN) injection 0-4 mg  0-4 mg Intravenous Q6H Eduard Clos, MD       Followed by  . [START ON 09/02/2012] LORazepam (ATIVAN) injection 0-4 mg  0-4 mg Intravenous Q12H Eduard Clos, MD      . LORazepam (ATIVAN) tablet 1 mg  1 mg Oral Q6H PRN Eduard Clos, MD       Or  . LORazepam (ATIVAN) injection 1 mg  1 mg Intravenous Q6H PRN Eduard Clos, MD      . magnesium sulfate IVPB 4 g 100 mL  4 g Intravenous Once Rodolph Bong, MD   4 g at 08/31/12 1635  . multivitamin with minerals tablet 1  tablet  1 tablet Oral Daily Eduard Clos, MD   1 tablet at 08/31/12 1610  . nadolol (CORGARD) tablet 10 mg  10 mg Oral Daily Eduard Clos, MD   10 mg at 08/31/12 9604  . ondansetron (ZOFRAN) tablet 4 mg  4 mg Oral Q6H PRN Eduard Clos, MD       Or  . ondansetron [facility]) injection 4 mg  4 mg Intravenous Q6H PRN Eduard Clos, MD      . Melene Muller ON 09/01/2012] pneumococcal 23 valent vaccine (PNU-IMMUNE) injection 0.5 mL  0.5 mL Intramuscular Tomorrow-1000 Eduard Clos, MD      . potassium chloride 20 MEQ/15ML (10%) liquid 20 mEq  20 mEq Oral BID Eduard Clos, MD      . potassium chloride SA (K-DUR,KLOR-CON) CR tablet 40 mEq  40 mEq Oral Once Olivia Mackie, MD      . rifaximin [facility]) tablet 550 mg  550 mg Oral BID Rodolph Bong, MD   550 mg at 08/31/12 1639  . sodium chloride 0.9 % injection 3 mL  3 mL Intravenous Q12H Eduard Clos, MD      . sodium chloride 0.9 % injection 3 mL  3 mL Intravenous Q12H Eduard Clos, MD      . spironolactone (ALDACTONE) tablet 100 mg  100 mg Oral Daily Eduard Clos, MD   100 mg at 08/31/12 5409  . thiamine (VITAMIN B-1) tablet 100 mg  100 mg Oral Daily Eduard Clos, MD   100 mg at 08/31/12 8119  . traZODone (DESYREL) tablet 100 mg  100 mg Oral QHS Eduard Clos, MD      . ursodiol (ACTIGALL) capsule 300 mg  300 mg Oral BID Eduard Clos, MD   300 mg at 08/31/12 1478    Allergies as of 08/30/2012  . (No Known Allergies)    Family History  Problem Relation Age of Onset  . CAD Father     History   Social History  . Marital Status: Single    Spouse Name: N/A    Number of Children: N/A  . Years of Education: N/A   Occupational History  . Not on file.   Social History Main Topics  . Smoking status: Current Every Day Smoker -- 1.00 packs/day for 20 years    Types: Cigarettes  . Smokeless tobacco: Never Used  . Alcohol Use: Yes     Comment: Pt drinks vodka/liquor every  weekend,  denies daily drinking  . Drug Use: No  . Sexual Activity: Yes    Birth Control/ Protection: IUD   Other Topics Concern  . Not on file   Social History Narrative  . No narrative on file    Review of Systems: See history of present illness  Physical Exam: Vital signs in last 24 hours: Temp:  [98.1 F (36.7 C)-100.5 F (38.1 C)] 100.5 F (38.1 C) (08/29 1624) Pulse Rate:  [83-99] 99 (08/29 1624) Resp:  [14-22] 16 (08/29 1624) BP: (94-114)/(43-58) 96/58 mmHg (08/29 1624) SpO2:  [86 %-100 %] 86 % (08/29 1624) Weight:  [63.821 kg (140 lb 11.2 oz)] 63.821 kg (140 lb 11.2 oz) (08/29 0607) Last BM Date: 08/30/12  This patient appears malnourished and almost cachectic. She is lethargic but coherent. Asterixis is present. She is deeply jaundiced. Oropharynx is benign. The chest has scattered expiratory rhonchi or wheezes. Heart has a soft early systolic murmur, regular rhythm. Abdomen protuberant consistent with ascites, nontender. She does have 2+ peripheral edema. No obvious cervical or supraclavicular adenopathy.  Intake/Output from previous day:   Intake/Output this shift:    Lab Results:  Recent Labs  08/30/12 2133 08/31/12 0705  WBC 12.2* 8.1  HGB 8.5* 7.2*  HCT 25.3* 21.5*  PLT 149* 100*   BMET  Recent Labs  08/30/12 2133 08/31/12 0705  NA 133* 131*  K 2.6* 2.5*  CL 82* 85*  CO2 37* 30  GLUCOSE 82 61*  BUN 6 6  CREATININE 0.47* 0.38*  CALCIUM 8.6 7.6*   LFT  Recent Labs  08/31/12 0705  PROT 6.6  ALBUMIN 2.3*  AST 109*  ALT 20  ALKPHOS 230*  BILITOT 6.5*  BILIDIR 4.1*  IBILI 2.4*   PT/INR  Recent Labs  08/31/12 0018  LABPROT 17.8*  INR 1.51*     Studies/Results: US Paracentesis  08/31/2012   *RADIOLOGY REPORT*  Clinical Data: ascites, abdominal pain r/o SBP  ULTRASOUND GUIDED PARACENTESIS  An ultrasound guided paracentesis was thoroughly discussed with the patient and questions answered.  The benefits, risks, alternatives and  complications were also discussed.  The patient understands and wishes to proceed with the procedure.  Written consent was obtained.  Ultrasound was performed to localize and mark an adequate pocket of fluid in the right upper quadrant of the abdomen.  The area was then prepped and draped in the normal sterile fashion.  1% Lidocaine was used for local anesthesia.  Under ultrasound guidance a 19 gauge Yueh catheter was introduced.  Paracentesis was performed.  The catheter was removed and a dressing applied.  Complications:  none  Findings:  A total of approximately 170cc of yellow fluid was removed.  A fluid sample was sent for laboratory analysis.  IMPRESSION: Successful ultrasound guided paracentesis yielding 170cc of ascites.  Read By: Pattricia Boss PA-C   Original Report Authenticated By: Judie Petit. Miles Costain, M.D.    Impression: 1. Chronic liver disease, multifactorial including alcohol and PSC. Since her INR is essentially normal, and her bilirubin is unchanged from 6 weeks ago, it does not appear that there has been a significant decrement in a hepatic function recently. 2. Ascites 3. Encephalopathy 4. Medical noncompliance, including continued alcohol use, not taking her lactulose as prescribed  Plan: I have reviewed the patient's orders and they appear appropriate.   The patient's chief acute change in status appears to be her mental status, so addition of rifaximin to her lactulose is appropriate and might be the best agent for her  to use as an outpatient, since the compliance with that medication is likely to be much better than compliance with lactulose.  Her current diuretic therapy can be assessed for adequacy based on daily weights. She is on an appropriate sodium restriction.  Concerning her hepatic dysfunction, obviously ethanol avoidance will be the main factor in her therapy, as well as in her potential candidacy for eventual liver transplantation.     LOS: 0 days   Theoren Palka V   08/31/2012, 5:34 PM

## 2012-08-31 NOTE — ED Notes (Addendum)
Pt is lethargic. Pt states that she took a sleeping pill (pt reports trazadone), while in triage. Pt also states that her last ingestion of alcohol was on "tuesday or Wednesday".

## 2012-09-01 DIAGNOSIS — R509 Fever, unspecified: Secondary | ICD-10-CM

## 2012-09-01 DIAGNOSIS — K8309 Other cholangitis: Secondary | ICD-10-CM

## 2012-09-01 LAB — IRON AND TIBC
Iron: 89 ug/dL (ref 42–135)
Saturation Ratios: 35 % (ref 20–55)
TIBC: 253 ug/dL (ref 250–470)
UIBC: 164 ug/dL (ref 125–400)

## 2012-09-01 LAB — HEPATIC FUNCTION PANEL
ALT: 23 U/L (ref 0–35)
Albumin: 2.3 g/dL — ABNORMAL LOW (ref 3.5–5.2)
Alkaline Phosphatase: 246 U/L — ABNORMAL HIGH (ref 39–117)
Total Bilirubin: 6.7 mg/dL — ABNORMAL HIGH (ref 0.3–1.2)
Total Protein: 6.7 g/dL (ref 6.0–8.3)

## 2012-09-01 LAB — CBC
HCT: 21.3 % — ABNORMAL LOW (ref 36.0–46.0)
Hemoglobin: 7.2 g/dL — ABNORMAL LOW (ref 12.0–15.0)
MCHC: 33.8 g/dL (ref 30.0–36.0)
RBC: 2.31 MIL/uL — ABNORMAL LOW (ref 3.87–5.11)

## 2012-09-01 LAB — BASIC METABOLIC PANEL
BUN: 6 mg/dL (ref 6–23)
Chloride: 89 mEq/L — ABNORMAL LOW (ref 96–112)
GFR calc Af Amer: >90 mL/min (ref >90)
GFR calc non Af Amer: >90 mL/min (ref >90)
Glucose, Bld: 143 mg/dL — ABNORMAL HIGH (ref 70–99)
Potassium: 3.6 mEq/L (ref 3.5–5.1)
Sodium: 130 mEq/L — ABNORMAL LOW (ref 135–145)

## 2012-09-01 LAB — FERRITIN: Ferritin: 61 ng/mL (ref 10–291)

## 2012-09-01 LAB — URINE CULTURE

## 2012-09-01 LAB — PROTIME-INR: Prothrombin Time: 20.4 seconds — ABNORMAL HIGH (ref 11.6–15.2)

## 2012-09-01 MED ORDER — LORAZEPAM 1 MG PO TABS
1.0000 mg | ORAL_TABLET | Freq: Four times a day (QID) | ORAL | Status: AC | PRN
  Administered 2012-09-03 (×3): 1 mg via ORAL
  Filled 2012-09-01 (×3): qty 1

## 2012-09-01 MED ORDER — FOLIC ACID 5 MG/ML IJ SOLN
1.0000 mg | Freq: Every day | INTRAMUSCULAR | Status: DC
  Administered 2012-09-02: 1 mg via INTRAVENOUS
  Filled 2012-09-01 (×3): qty 0.2

## 2012-09-01 MED ORDER — LACTULOSE ENEMA
300.0000 mL | Freq: Two times a day (BID) | ORAL | Status: DC
  Administered 2012-09-01 – 2012-09-03 (×3): 300 mL via RECTAL
  Filled 2012-09-01 (×7): qty 300

## 2012-09-01 MED ORDER — THIAMINE HCL 100 MG/ML IJ SOLN
100.0000 mg | Freq: Every day | INTRAMUSCULAR | Status: DC
  Administered 2012-09-02: 100 mg via INTRAVENOUS
  Filled 2012-09-01 (×2): qty 1

## 2012-09-01 MED ORDER — LORAZEPAM 2 MG/ML IJ SOLN
1.0000 mg | Freq: Four times a day (QID) | INTRAMUSCULAR | Status: AC | PRN
  Administered 2012-09-01 – 2012-09-02 (×2): 1 mg via INTRAVENOUS
  Filled 2012-09-01 (×3): qty 1

## 2012-09-01 MED ORDER — PANTOPRAZOLE SODIUM 40 MG IV SOLR
40.0000 mg | INTRAVENOUS | Status: DC
  Administered 2012-09-01 – 2012-09-02 (×2): 40 mg via INTRAVENOUS
  Filled 2012-09-01 (×3): qty 40

## 2012-09-01 MED ORDER — FUROSEMIDE 10 MG/ML IJ SOLN
20.0000 mg | Freq: Every day | INTRAMUSCULAR | Status: DC
  Administered 2012-09-02 – 2012-09-04 (×3): 20 mg via INTRAVENOUS
  Filled 2012-09-01 (×3): qty 2

## 2012-09-01 MED ORDER — SODIUM CHLORIDE 0.9 % IV SOLN: 1.0000 mg | Freq: Every day | INTRAVENOUS | Status: DC

## 2012-09-01 NOTE — Progress Notes (Signed)
[facility] PROGRESS NOTE  [name] MVH:[id number] DOB: [birth year] DOA: 08/31/2012 PCP: Gabriel Cirri, DO  Assessment/Plan: #1 acute encephalopathy/probable hepatic encephalopathy Likely secondary to hepatic encephalopathy. Patient lethargic. Patient jaundice. Ammonia level on admission was elevated. Patient was also placed on the Ativan withdrawal protocol with scheduled Ativan as well. Will discontinue scheduled Ativan. Change lactulose to lactulose enemas. Follow. If no improvement in mental status Will get a CT of the head. Continue Xifaxan. Follow.  #2 decompensated cirrhosis/Ascites Patient had presented with distended abdomen and lower extremity edema. Lower extremity edema with some improvement. Patient is status post diagnostic and therapeutic paracentesis. Cultures are negative. Doubt SBP. Patient on IV cefotaxime. Patient however still febrile and a such will continue IV cefotaxime. Continue lactulose, Xifaxan, spironolactone, Lasix, nadolol.  GI following and appreciate input and recommendations.  #3 ongoing alcohol dependence Place on Ativan  withdrawal protocol. Alcohol cessation.  #4 primary sclerosing cholangitis Per gastroenterology.  #5 fevers Questionable etiology. Urinalysis was worrisome for UTI however urine cultures with multiple bacterial morphotypes. Chest x-ray is negative for any acute infiltrates. Paracentesis was negative for SBP. Blood cultures are pending. Continue empiric IV cefotaxime. Follow. If patient continues to spike fevers we'll broaden antibiotics and add IV vancomycin.  #6 anemia Follow H&H.stable  #7 hypokalemia Likely secondary to GI losses and diuretics. Replete potassium. Keep magnesium greater than 2. Follow.  #8 thrombocytopenia Likely chronic secondary to cirrhosis. Follow. No bleeding noted.  #9 prophylaxis Protonix for GI prophylaxis. SCDs for DVT prophylaxis.  Code Status: full Family Communication: updated patient no  family present. Disposition Plan: home when medically stable.   Consultants:  Gastroenterology: Dr. Matthias Hughs 08/31/2012  Procedures:  Diagnostic and therapeutic paracentesis 08/31/2012  Antibiotics:  IV cefotaxime 08/31/2012  HPI/Subjective: Patient is lethargic. Patient opens eyes to verbal stimuli however drifts back to sleep.  Objective: Filed Vitals:   09/01/12 0900  BP: 97/61  Pulse: 102  Temp: 99 F (37.2 C)  Resp: 17    Intake/Output Summary (Last 24 hours) at 09/01/12 1116 Last data filed at 09/01/12 0929  Gross per 24 hour  Intake      0 ml  Output      0 ml  Net      0 ml   Filed Weights   08/31/12 0607 08/31/12 2108  Weight: 63.821 kg (140 lb 11.2 oz) 65.182 kg (143 lb 11.2 oz)    Exam:   General:  Lethargic.  Cardiovascular: RRR  Respiratory: CTAB  Abdomen: Soft/NT/distended/+BS   Extremities: 1-2+ bilateral lower extremity edema.  Data Reviewed: Basic Metabolic Panel:  Recent Labs Lab 08/30/12 2133 08/31/12 0705 08/31/12 2038 09/01/12 0600  NA 133* 131* 130* 130*  K 2.6* 2.5* 3.1* 3.6  CL 82* 85* 87* 89*  CO2 37* 30 29 32  GLUCOSE 82 61* 121* 143*  BUN 6 6 6 6   CREATININE 0.47* 0.38* 0.46* 0.53  CALCIUM 8.6 7.6* 8.2* 8.2*  MG  --  1.3*  --  2.1   Liver Function Tests:  Recent Labs Lab 08/30/12 2133 08/31/12 0705 09/01/12 0600  AST 135* 109* 154*  ALT 25 20 23   ALKPHOS 282* 230* 246*  BILITOT 7.7* 6.5* 6.7*  PROT 8.0 6.6 6.7  ALBUMIN 2.8* 2.3* 2.3*    Recent Labs Lab 08/30/12 2133  LIPASE 16    Recent Labs Lab 08/31/12 0116 09/01/12 0600  AMMONIA 77* 93*   CBC:  Recent Labs Lab 08/30/12 2133 08/31/12 0705 09/01/12 0600  WBC 12.2* 8.1 9.3  NEUTROABS 9.8* 6.5  --   HGB 8.5* 7.2* 7.2*  HCT 25.3* 21.5* 21.3*  MCV 92.7 92.7 92.2  PLT 149* 100* 90*   Cardiac Enzymes: No results found for this basename: CKTOTAL, CKMB, CKMBINDEX, TROPONINI,  in the last 168 hours BNP (last 3 results) No results  found for this basename: PROBNP,  in the last 8760 hours CBG: No results found for this basename: GLUCAP,  in the last 168 hours  Recent Results (from the past 240 hour(s))  URINE CULTURE     Status: None   Collection Time    08/31/12  5:00 AM      Result Value Range Status   Specimen Description URINE, CLEAN CATCH   Final   Special Requests CX ADDED AT 0541 ON [id number]   Final   Culture  Setup Time     Final   Value: 08/31/2012 06:09     Performed at [facility]   Colony Count     Final   Value: 30,000 COLONIES/ML     Performed at [facility]   Culture     Final   Value: Multiple bacterial morphotypes present, none predominant. Suggest appropriate recollection if clinically indicated.     Performed at [facility]   Report Status 09/01/2012 FINAL   Final  GRAM STAIN     Status: None   Collection Time    08/31/12  3:22 PM      Result Value Range Status   Specimen Description ASCITIC ABDOMEN FLUID   Final   Special Requests FLUID   Final   Gram Stain     Final   Value: FEW WBC PRESENT, PREDOMINANTLY MONONUCLEAR     NO ORGANISMS SEEN   Report Status 08/31/2012 FINAL   Final  BODY FLUID CULTURE     Status: None   Collection Time    08/31/12  3:22 PM      Result Value Range Status   Specimen Description ASCITIC ABDOMEN FLUID   Final   Special Requests   Final   Gram Stain     Final   Value: FEW WBC PRESENT, PREDOMINANTLY MONONUCLEAR     NO ORGANISMS SEEN     Performed at [facility]   Culture PENDING   Incomplete   Report Status PENDING   Incomplete     Studies: US Paracentesis  08/31/2012   *RADIOLOGY REPORT*  Clinical Data: ascites, abdominal pain r/o SBP  ULTRASOUND GUIDED PARACENTESIS  An ultrasound guided paracentesis was thoroughly discussed with the patient and questions answered.  The benefits, risks, alternatives and complications were also discussed.  The patient understands and wishes to proceed with the procedure.   Written consent was obtained.  Ultrasound was performed to localize and mark an adequate pocket of fluid in the right upper quadrant of the abdomen.  The area was then prepped and draped in the normal sterile fashion.  1% Lidocaine was used for local anesthesia.  Under ultrasound guidance a 19 gauge Yueh catheter was introduced.  Paracentesis was performed.  The catheter was removed and a dressing applied.  Complications:  none  Findings:  A total of approximately 170cc of yellow fluid was removed.  A fluid sample was sent for laboratory analysis.  IMPRESSION: Successful ultrasound guided paracentesis yielding 170cc of ascites.  Read By: Pattricia Boss PA-C   Original Report Authenticated By: Judie Petit. Shick, M.D.    Scheduled Meds: . cefoTAXime (CLAFORAN) IV  1 g Intravenous  Q8H  . folic acid  1 mg Oral Daily  . furosemide  40 mg Oral Daily  . lactulose  30 g Oral TID  . LORazepam  0-4 mg Intravenous Q6H   Followed by  . [START ON 09/02/2012] LORazepam  0-4 mg Intravenous Q12H  . multivitamin with minerals  1 tablet Oral Daily  . nadolol  10 mg Oral Daily  . pneumococcal 23 valent vaccine  0.5 mL Intramuscular Tomorrow-1000  . potassium chloride  20 mEq Oral BID  . potassium chloride  40 mEq Oral Once  . rifaximin  550 mg Oral BID  . sodium chloride  3 mL Intravenous Q12H  . sodium chloride  3 mL Intravenous Q12H  . spironolactone  100 mg Oral Daily  . thiamine  100 mg Oral Daily  . traZODone  100 mg Oral QHS  . ursodiol  300 mg Oral BID   Continuous Infusions:   Principal Problem:   Ascites Active Problems:   Jaundice   Primary sclerosing cholangitis   Edema   UTI (urinary tract infection)   EtOH dependence   Acute encephalopathy   Encephalopathy, hepatic    Time spent: > 35 mins    [facility]  [facility] Pager [phone]. If 7PM-7AM, please contact night-coverage at [email or web address], password [facility] 09/01/2012, 11:16 AM  LOS: 1 day

## 2012-09-01 NOTE — Progress Notes (Signed)
[facility] Progress Note  Subjective: Lethargic arousable no specific complaints  Objective: Vital signs in last 24 hours: Temp:  [98.7 F (37.1 C)-100.5 F (38.1 C)] 100 F (37.8 C) (08/30 0504) Pulse Rate:  [97-105] 105 (08/30 0504) Resp:  [16-18] 18 (08/30 0504) BP: (94-110)/(43-58) 95/56 mmHg (08/30 0504) SpO2:  [86 %-98 %] 98 % (08/30 0504) Weight:  [65.182 kg (143 lb 11.2 oz)] 65.182 kg (143 lb 11.2 oz) (08/29 2108) Weight change: 1.361 kg (3 lb)   PE: Abdomen soft slightly distended nontender  Lab Results: Results for orders placed during the hospital encounter of 08/31/12 (from the past 24 hour(s))  BODY FLUID CELL COUNT WITH DIFFERENTIAL     Status: Abnormal   Collection Time    08/31/12  3:22 PM      Result Value Range   Fluid Type-FCT ASCITIC     Color, Fluid YELLOW (*) YELLOW   Appearance, Fluid HAZY (*) CLEAR   WBC, Fluid 222  0 - 1000 cu mm   Neutrophil Count, Fluid 11  0 - 25 %   Lymphs, Fluid 10     Monocyte-Macrophage-Serous Fluid 79  50 - 90 %   Eos, Fluid 0    GRAM STAIN     Status: None   Collection Time    08/31/12  3:22 PM      Result Value Range   Specimen Description ASCITIC ABDOMEN FLUID     Special Requests FLUID     Gram Stain       Value: FEW WBC PRESENT, PREDOMINANTLY MONONUCLEAR     NO ORGANISMS SEEN   Report Status 08/31/2012 FINAL    PROTEIN, BODY FLUID     Status: None   Collection Time    08/31/12  3:22 PM      Result Value Range   Total protein, fluid 1.2     Fluid Type-FTP ASCITIC    LACTATE DEHYDROGENASE, BODY FLUID     Status: Abnormal   Collection Time    08/31/12  3:22 PM      Result Value Range   LD, Fluid 37 (*) 3 - 23 U/L   Fluid Type-FLDH ASCITIC    BASIC METABOLIC PANEL     Status: Abnormal   Collection Time    08/31/12  8:38 PM      Result Value Range   Sodium 130 (*) 135 - 145 mEq/L   Potassium 3.1 (*) 3.5 - 5.1 mEq/L   Chloride 87 (*) 96 - 112 mEq/L   CO2 29  19 - 32 mEq/L   Glucose, Bld  121 (*) 70 - 99 mg/dL   BUN 6  6 - 23 mg/dL   Creatinine, Ser 1.19 (*) 0.50 - 1.10 mg/dL   Calcium 8.2 (*) 8.4 - 10.5 mg/dL   GFR calc non Af Amer >90  >90 mL/min   GFR calc Af Amer >90  >90 mL/min  CBC     Status: Abnormal   Collection Time    09/01/12  6:00 AM      Result Value Range   WBC 9.3  4.0 - 10.5 K/uL   RBC 2.31 (*) 3.87 - 5.11 MIL/uL   Hemoglobin 7.2 (*) 12.0 - 15.0 g/dL   HCT 14.7 (*) 82.9 - 56.2 %   MCV 92.2  78.0 - 100.0 fL   MCH 31.2  26.0 - 34.0 pg   MCHC 33.8  30.0 - 36.0 g/dL   RDW 13.0 (*) 86.5 - 78.4 %  Platelets 90 (*) 150 - 400 K/uL  PROTIME-INR     Status: Abnormal   Collection Time    09/01/12  6:00 AM      Result Value Range   Prothrombin Time 20.4 (*) 11.6 - 15.2 seconds   INR 1.80 (*) 0.00 - 1.49    Studies/Results: US Paracentesis  08/31/2012   *RADIOLOGY REPORT*  Clinical Data: ascites, abdominal pain r/o SBP  ULTRASOUND GUIDED PARACENTESIS  An ultrasound guided paracentesis was thoroughly discussed with the patient and questions answered.  The benefits, risks, alternatives and complications were also discussed.  The patient understands and wishes to proceed with the procedure.  Written consent was obtained.  Ultrasound was performed to localize and mark an adequate pocket of fluid in the right upper quadrant of the abdomen.  The area was then prepped and draped in the normal sterile fashion.  1% Lidocaine was used for local anesthesia.  Under ultrasound guidance a 19 gauge Yueh catheter was introduced.  Paracentesis was performed.  The catheter was removed and a dressing applied.  Complications:  none  Findings:  A total of approximately 170cc of yellow fluid was removed.  A fluid sample was sent for laboratory analysis.  IMPRESSION: Successful ultrasound guided paracentesis yielding 170cc of ascites.  Read By: Pattricia Boss PA-C   Original Report Authenticated By: Judie Petit. Miles Costain, M.D.      Assessment: 1. Primary sclerosing cholangitis 2. Chronic  alcoholic liver disease 3. Ascites does not meet criteria for spontaneous bacterial peritonitis 4. Hepatic encephalopathy  Plan: Continue diuretics Continue lactulose and rifaximin Focus of alcohol avoidance and restrict sodium Advance diet as tolerated  Makaley Storts C 09/01/2012, 7:39 AM

## 2012-09-01 NOTE — Progress Notes (Signed)
Pt found kneeling on floor at 2300. Bed alarm going off, NT walking into room, pt attempting to get up by herself. Vitals signs: Temp 99.0, BP 94/58, HR 95bpm, O2sat: 97% on 2L [place]. Pt fully assessed, asymptomatic and no complaints of pain. MD called, no new orders given. Pt moved to camera room. Will continue to monitor and assess. Monte Fantasia, RN

## 2012-09-02 DIAGNOSIS — R188 Other ascites: Secondary | ICD-10-CM

## 2012-09-02 DIAGNOSIS — D649 Anemia, unspecified: Secondary | ICD-10-CM | POA: Diagnosis present

## 2012-09-02 DIAGNOSIS — G9340 Encephalopathy, unspecified: Secondary | ICD-10-CM

## 2012-09-02 DIAGNOSIS — R45851 Suicidal ideations: Secondary | ICD-10-CM

## 2012-09-02 LAB — COMPREHENSIVE METABOLIC PANEL
ALT: 22 U/L (ref 0–35)
AST: 114 U/L — ABNORMAL HIGH (ref 0–37)
Albumin: 2.2 g/dL — ABNORMAL LOW (ref 3.5–5.2)
Alkaline Phosphatase: 225 U/L — ABNORMAL HIGH (ref 39–117)
Chloride: 92 mEq/L — ABNORMAL LOW (ref 96–112)
Potassium: 3.6 mEq/L (ref 3.5–5.1)
Sodium: 127 mEq/L — ABNORMAL LOW (ref 135–145)
Total Bilirubin: 6.3 mg/dL — ABNORMAL HIGH (ref 0.3–1.2)

## 2012-09-02 LAB — CBC
MCH: 31.5 pg (ref 26.0–34.0)
MCHC: 32.2 g/dL (ref 30.0–36.0)
MCV: 97.7 fL (ref 78.0–100.0)
Platelets: 74 10*3/uL — ABNORMAL LOW (ref 150–400)
RBC: 2.13 MIL/uL — ABNORMAL LOW (ref 3.87–5.11)
RDW: 20 % — ABNORMAL HIGH (ref 11.5–15.5)

## 2012-09-02 MED ORDER — FUROSEMIDE 10 MG/ML IJ SOLN
20.0000 mg | Freq: Once | INTRAMUSCULAR | Status: AC
  Administered 2012-09-02: 20 mg via INTRAVENOUS
  Filled 2012-09-02: qty 2

## 2012-09-02 MED ORDER — DIPHENHYDRAMINE HCL 25 MG PO CAPS
25.0000 mg | ORAL_CAPSULE | Freq: Once | ORAL | Status: AC
  Administered 2012-09-02: 25 mg via ORAL
  Filled 2012-09-02: qty 1

## 2012-09-02 MED ORDER — FLUOXETINE HCL 10 MG PO CAPS
10.0000 mg | ORAL_CAPSULE | Freq: Every day | ORAL | Status: DC
  Administered 2012-09-02 – 2012-09-05 (×4): 10 mg via ORAL
  Filled 2012-09-02 (×4): qty 1

## 2012-09-02 MED ORDER — ACETAMINOPHEN 325 MG PO TABS
650.0000 mg | ORAL_TABLET | Freq: Once | ORAL | Status: AC
  Administered 2012-09-02: 650 mg via ORAL

## 2012-09-02 MED ORDER — MAGNESIUM SULFATE 50 % IJ SOLN
3.0000 g | Freq: Once | INTRAVENOUS | Status: AC
  Administered 2012-09-02: 3 g via INTRAVENOUS
  Filled 2012-09-02: qty 6

## 2012-09-02 NOTE — Progress Notes (Signed)
[facility] PROGRESS NOTE  [name] ZOX:[id number] DOB: 10-13-1970 DOA: 08/31/2012 PCP: Gabriel Cirri, DO  Assessment/Plan: #1 acute encephalopathy/probable hepatic encephalopathy Likely secondary to hepatic encephalopathy. Patient more alert today. Patient jaundiced. Ammonia elevated. Continue lactulose. Follow. Continue Xifaxan. Follow.  #2 Decompensated cirrhosis/Ascites Patient had presented with distended abdomen and lower extremity edema. Lower extremity edema with some improvement. Patient is status post diagnostic and therapeutic paracentesis. Cultures are negative. Doubt SBP. Patient on IV cefotaxime. Patient however afebrile. Continue lactulose, Xifaxan, spironolactone, Lasix, nadolol.  GI following and appreciate input and recommendations.  #3 ongoing alcohol dependence Continue Ativan  withdrawal protocol. Alcohol cessation.  #4 primary sclerosing cholangitis Per gastroenterology.  #5 fevers Questionable etiology. Urinalysis was worrisome for UTI however urine cultures with multiple bacterial morphotypes. Chest x-ray is negative for any acute infiltrates. Paracentesis was negative for SBP. Blood cultures are pending. Continue empiric IV cefotaxime. Follow. If patient continues to spike fevers will broaden antibiotics and add IV vancomycin.  #6 anemia  Hgb 6.7. No overt GIB. Transfuse 2 units PRBCs. Follow H&H. stable  #7 hypokalemia Likely secondary to GI losses and diuretics. Replete potassium. Keep magnesium greater than 2. Follow.  #8 thrombocytopenia Likely chronic secondary to cirrhosis. Follow. No bleeding noted.  #9 SI Psychiatry consultation  #10 prophylaxis Protonix for GI prophylaxis. SCDs for DVT prophylaxis.  Code Status: full Family Communication: updated patient no family present. Disposition Plan: home when medically stable.   Consultants:  Gastroenterology: Dr. Matthias Hughs 08/31/2012  Psychiatry pending.  Procedures:  Diagnostic and  therapeutic paracentesis 08/31/2012  Antibiotics:  IV cefotaxime 08/31/2012  HPI/Subjective: Patient is more alert. Per sitter patient with SI.  Objective: Filed Vitals:   09/02/12 1545  BP: 98/46  Pulse: 95  Temp: 97.2 F (36.2 C)  Resp: 18    Intake/Output Summary (Last 24 hours) at 09/02/12 1706 Last data filed at 09/02/12 1530  Gross per 24 hour  Intake    600 ml  Output      0 ml  Net    600 ml   Filed Weights   08/31/12 0607 08/31/12 2108 09/01/12 2049  Weight: 63.821 kg (140 lb 11.2 oz) 65.182 kg (143 lb 11.2 oz) 65.176 kg (143 lb 11 oz)    Exam:   General:  More alert.   Cardiovascular: RRR  Respiratory: CTAB  Abdomen: Soft/NT/distended/+BS   Extremities: 1-2+ bilateral lower extremity edema.  Data Reviewed: Basic Metabolic Panel:  Recent Labs Lab 08/30/12 2133 08/31/12 0705 08/31/12 2038 09/01/12 0600 09/02/12 0500  NA 133* 131* 130* 130* 127*  K 2.6* 2.5* 3.1* 3.6 3.6  CL 82* 85* 87* 89* 92*  CO2 37* 30 29 32 28  GLUCOSE 82 61* 121* 143* 138*  BUN 6 6 6 6  5*  CREATININE 0.47* 0.38* 0.46* 0.53 0.47*  CALCIUM 8.6 7.6* 8.2* 8.2* 8.5  MG  --  1.3*  --  2.1 1.7   Liver Function Tests:  Recent Labs Lab 08/30/12 2133 08/31/12 0705 09/01/12 0600 09/02/12 0500  AST 135* 109* 154* 114*  ALT 25 20 23 22   ALKPHOS 282* 230* 246* 225*  BILITOT 7.7* 6.5* 6.7* 6.3*  PROT 8.0 6.6 6.7 6.5  ALBUMIN 2.8* 2.3* 2.3* 2.2*    Recent Labs Lab 08/30/12 2133  LIPASE 16    Recent Labs Lab 08/31/12 0116 09/01/12 0600 09/02/12 0630  AMMONIA 77* 93* 65*   CBC:  Recent Labs Lab 08/30/12 2133 08/31/12 0705 09/01/12 0600 09/02/12 0500  WBC 12.2* 8.1 9.3 8.0  NEUTROABS 9.8* 6.5  --   --   HGB 8.5* 7.2* 7.2* 6.7*  HCT 25.3* 21.5* 21.3* 20.8*  MCV 92.7 92.7 92.2 97.7  PLT 149* 100* 90* 74*   Cardiac Enzymes: No results found for this basename: CKTOTAL, CKMB, CKMBINDEX, TROPONINI,  in the last 168 hours BNP (last 3 results) No results  found for this basename: PROBNP,  in the last 8760 hours CBG: No results found for this basename: GLUCAP,  in the last 168 hours  Recent Results (from the past 240 hour(s))  URINE CULTURE     Status: None   Collection Time    08/31/12  5:00 AM      Result Value Range Status   Specimen Description URINE, CLEAN CATCH   Final   Special Requests CX ADDED AT 0541 ON [id number]   Final   Culture  Setup Time     Final   Value: 08/31/2012 06:09     Performed at [facility]   Colony Count     Final   Value: 30,000 COLONIES/ML     Performed at [facility]   Culture     Final   Value: Multiple bacterial morphotypes present, none predominant. Suggest appropriate recollection if clinically indicated.     Performed at [facility]   Report Status 09/01/2012 FINAL   Final  GRAM STAIN     Status: None   Collection Time    08/31/12  3:22 PM      Result Value Range Status   Specimen Description ASCITIC ABDOMEN FLUID   Final   Special Requests FLUID   Final   Gram Stain     Final   Value: FEW WBC PRESENT, PREDOMINANTLY MONONUCLEAR     NO ORGANISMS SEEN   Report Status 08/31/2012 FINAL   Final  BODY FLUID CULTURE     Status: None   Collection Time    08/31/12  3:22 PM      Result Value Range Status   Specimen Description ASCITIC ABDOMEN FLUID   Final   Special Requests   Final   Gram Stain     Final   Value: FEW WBC PRESENT, PREDOMINANTLY MONONUCLEAR     NO ORGANISMS SEEN     Performed at [facility]   Culture     Final   Value: NO GROWTH 2 DAYS     Performed at [facility]   Report Status PENDING   Incomplete  CULTURE, BLOOD (ROUTINE X 2)     Status: None   Collection Time    08/31/12  8:30 PM      Result Value Range Status   Specimen Description BLOOD FOREARM LEFT   Final   Special Requests BOTTLES DRAWN AEROBIC ONLY 2CC   Final   Culture  Setup Time     Final   Value: 09/01/2012 02:21     Performed at [facility]    Culture     Final   Value:        BLOOD CULTURE RECEIVED NO GROWTH TO DATE CULTURE WILL BE HELD FOR 5 DAYS BEFORE ISSUING A FINAL NEGATIVE REPORT     Performed at [facility]   Report Status PENDING   Incomplete  CULTURE, BLOOD (ROUTINE X 2)     Status: None   Collection Time    08/31/12  8:38 PM      Result Value Range Status   Specimen Description BLOOD  LEFT HAND   Final   Special Requests BOTTLES DRAWN AEROBIC ONLY 1.5CC   Final   Culture  Setup Time     Final   Value: 09/01/2012 02:21     Performed at [facility]   Culture     Final   Value:        BLOOD CULTURE RECEIVED NO GROWTH TO DATE CULTURE WILL BE HELD FOR 5 DAYS BEFORE ISSUING A FINAL NEGATIVE REPORT     Performed at [facility]   Report Status PENDING   Incomplete     Studies: No results found.  Scheduled Meds: . cefoTAXime (CLAFORAN) IV  1 g Intravenous Q8H  . folic acid  1 mg Intravenous Daily  . furosemide  20 mg Intravenous Daily  . lactulose  300 mL Rectal BID  . multivitamin with minerals  1 tablet Oral Daily  . nadolol  10 mg Oral Daily  . pantoprazole (PROTONIX) IV  40 mg Intravenous Q24H  . potassium chloride  20 mEq Oral BID  . potassium chloride  40 mEq Oral Once  . rifaximin  550 mg Oral BID  . sodium chloride  3 mL Intravenous Q12H  . sodium chloride  3 mL Intravenous Q12H  . spironolactone  100 mg Oral Daily  . thiamine  100 mg Intravenous Daily  . ursodiol  300 mg Oral BID   Continuous Infusions:   Principal Problem:   Acute encephalopathy Active Problems:   Ascites   Jaundice   Primary sclerosing cholangitis   Edema   UTI (urinary tract infection)   EtOH dependence   Encephalopathy, hepatic   Anemia   Suicidal ideation    Time spent: > 35 mins    [facility]  [facility] Pager [phone]. If 7PM-7AM, please contact night-coverage at [email or web address], password [facility] 09/02/2012, 5:06 PM  LOS: 2 days

## 2012-09-02 NOTE — Progress Notes (Signed)
Patient said to the sitter"i want to die"Staff will monitor.

## 2012-09-02 NOTE — Consult Note (Signed)
Reason for Consult: Cirrhosis of liver, depression and SI Referring Physician: Dr. Bridgett Larsson King is an 42 y.o. female.  HPI: Patient is seen and chart reviewed. Patient has a boyfriend next to her bed. 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  patient wife and stated that she has been associated with a group of people near her neighborhood and has been being wrong things.  patient endorses some symptoms of depression especially snapping, irritability, loss of energy, feeling tired and passive suicide ideation about 3 weeks ago. Patient denies current symptoms of suicidal ideation, homicidal ideation intentions or plans. Patient has no evidence of psychotic symptoms. Patient denied drug of abuse other than alcohol dependence. Patient consents for antidepressant medication which may help emotionally.  Medical history: [name] is a 42 y.o. female with history of primary sclerosing cholangitis, colitis presents with increasing abdominal distal channel lower extremity edema. In addition patient's alcohol levels were found to be high. Patient has been admitted for decompensated cirrhosis of liver. Patient does have distended abdomen and 3+ lower extremity edema. Patient was just admitted 2 months ago and had paracentesis.   Mental Status Examination: Patient appeared as per his stated age,  But tired, fatigue and talking with low volumt of speech.  she has fair eye contact. Patient has  depressedmood and his affect was constricted. He has normal rate, rhythm, and low  volume of speech. His thought process is linear and goal directed. Patient has denied suicidal, homicidal ideations, intentions or plans. Patient has no evidence of auditory or visual hallucinations, delusions, and paranoia. Patient has fair  to poor insight judgment and impulse control.  Past Medical History  Diagnosis Date  . Sclerosing cholangitis    . Panic attacks   . Anxiety   . Alcohol abuse   . Ascites   . Cirrhosis of liver   . EtOH dependence 08/31/2012  . Family history of anesthesia complication     " my son had problems "    Past Surgical History  Procedure Laterality Date  . None    . Ercp      Family History  Problem Relation Age of Onset  . CAD Father     Social History:  reports that she has been smoking Cigarettes.  She has a 20 pack-year smoking history. She has never used smokeless tobacco. She reports that  drinks alcohol. She reports that she does not use illicit drugs.  Allergies: No Known Allergies  Medications: I have reviewed the patient's current medications.  Results for orders placed during the hospital encounter of 08/31/12 (from the past 48 hour(s))  CULTURE, BLOOD (ROUTINE X 2)     Status: None   Collection Time    08/31/12  8:30 PM      Result Value Range   Specimen Description BLOOD FOREARM LEFT     Special Requests BOTTLES DRAWN AEROBIC ONLY 2CC     Culture  Setup Time       Value: 09/01/2012 02:21     Performed at [facility]   Culture       Value:        BLOOD CULTURE RECEIVED NO GROWTH TO DATE CULTURE WILL BE HELD FOR 5 DAYS BEFORE ISSUING A FINAL NEGATIVE REPORT     Performed at [facility]   Report Status PENDING    BASIC METABOLIC PANEL     Status:  Abnormal   Collection Time    08/31/12  8:38 PM      Result Value Range   Sodium 130 (*) 135 - 145 mEq/L   Potassium 3.1 (*) 3.5 - 5.1 mEq/L   Comment: NO VISIBLE HEMOLYSIS   Chloride 87 (*) 96 - 112 mEq/L   CO2 29  19 - 32 mEq/L   Glucose, Bld 121 (*) 70 - 99 mg/dL   BUN 6  6 - 23 mg/dL   Creatinine, Ser 5.62 (*) 0.50 - 1.10 mg/dL   Calcium 8.2 (*) 8.4 - 10.5 mg/dL   GFR calc non Af Amer >90  >90 mL/min   GFR calc Af Amer >90  >90 mL/min   Comment: (NOTE)     The eGFR has been calculated using the CKD EPI equation.     This calculation has not been validated in all clinical situations.     eGFR's  persistently <90 mL/min signify possible Chronic Kidney     Disease.  CULTURE, BLOOD (ROUTINE X 2)     Status: None   Collection Time    08/31/12  8:38 PM      Result Value Range   Specimen Description BLOOD LEFT HAND     Special Requests BOTTLES DRAWN AEROBIC ONLY 1.5CC     Culture  Setup Time       Value: 09/01/2012 02:21     Performed at [facility]   Culture       Value:        BLOOD CULTURE RECEIVED NO GROWTH TO DATE CULTURE WILL BE HELD FOR 5 DAYS BEFORE ISSUING A FINAL NEGATIVE REPORT     Performed at [facility]   Report Status PENDING    BASIC METABOLIC PANEL     Status: Abnormal   Collection Time    09/01/12  6:00 AM      Result Value Range   Sodium 130 (*) 135 - 145 mEq/L   Potassium 3.6  3.5 - 5.1 mEq/L   Chloride 89 (*) 96 - 112 mEq/L   CO2 32  19 - 32 mEq/L   Glucose, Bld 143 (*) 70 - 99 mg/dL   BUN 6  6 - 23 mg/dL   Creatinine, Ser 1.30  0.50 - 1.10 mg/dL   Calcium 8.2 (*) 8.4 - 10.5 mg/dL   GFR calc non Af Amer >90  >90 mL/min   GFR calc Af Amer >90  >90 mL/min   Comment: (NOTE)     The eGFR has been calculated using the CKD EPI equation.     This calculation has not been validated in all clinical situations.     eGFR's persistently <90 mL/min signify possible Chronic Kidney     Disease.  CBC     Status: Abnormal   Collection Time    09/01/12  6:00 AM      Result Value Range   WBC 9.3  4.0 - 10.5 K/uL   RBC 2.31 (*) 3.87 - 5.11 MIL/uL   Hemoglobin 7.2 (*) 12.0 - 15.0 g/dL   HCT 86.5 (*) 78.4 - 69.6 %   MCV 92.2  78.0 - 100.0 fL   MCH 31.2  26.0 - 34.0 pg   MCHC 33.8  30.0 - 36.0 g/dL   RDW 29.5 (*) 28.4 - 13.2 %   Platelets 90 (*) 150 - 400 K/uL   Comment: CONSISTENT WITH PREVIOUS RESULT  HEPATIC FUNCTION PANEL     Status: Abnormal   Collection  Time    09/01/12  6:00 AM      Result Value Range   Total Protein 6.7  6.0 - 8.3 g/dL   Albumin 2.3 (*) 3.5 - 5.2 g/dL   AST 782 (*) 0 - 37 U/L   ALT 23  0 - 35 U/L   Alkaline  Phosphatase 246 (*) 39 - 117 U/L   Total Bilirubin 6.7 (*) 0.3 - 1.2 mg/dL   Bilirubin, Direct 4.7 (*) 0.0 - 0.3 mg/dL   Indirect Bilirubin 2.0 (*) 0.3 - 0.9 mg/dL  AMMONIA     Status: Abnormal   Collection Time    09/01/12  6:00 AM      Result Value Range   Ammonia 93 (*) 11 - 60 umol/L  VITAMIN B12     Status: Abnormal   Collection Time    09/01/12  6:00 AM      Result Value Range   Vitamin B-12 1901 (*) 211 - 911 pg/mL   Comment: Performed at [facility]  FOLATE     Status: None   Collection Time    09/01/12  6:00 AM      Result Value Range   Folate 6.3     Comment: (NOTE)     Reference Ranges            Deficient:       0.4 - 3.3 ng/mL            Indeterminate:   3.4 - 5.4 ng/mL            Normal:              > 5.4 ng/mL     Performed at [facility]  IRON AND TIBC     Status: None   Collection Time    09/01/12  6:00 AM      Result Value Range   Iron 89  42 - 135 ug/dL   TIBC 956  213 - 086 ug/dL   Saturation Ratios 35  20 - 55 %   UIBC 164  125 - 400 ug/dL   Comment: Performed at [facility]  FERRITIN     Status: None   Collection Time    09/01/12  6:00 AM      Result Value Range   Ferritin 61  10 - 291 ng/mL   Comment: Performed at [facility]  PROTIME-INR     Status: Abnormal   Collection Time    09/01/12  6:00 AM      Result Value Range   Prothrombin Time 20.4 (*) 11.6 - 15.2 seconds   INR 1.80 (*) 0.00 - 1.49  ETHANOL     Status: None   Collection Time    09/01/12  6:00 AM      Result Value Range   Alcohol, Ethyl (B) <11  0 - 11 mg/dL   Comment:            LOWEST DETECTABLE LIMIT FOR     SERUM ALCOHOL IS 11 mg/dL     FOR MEDICAL PURPOSES ONLY  MAGNESIUM     Status: None   Collection Time    09/01/12  6:00 AM      Result Value Range   Magnesium 2.1  1.5 - 2.5 mg/dL  COMPREHENSIVE METABOLIC PANEL     Status: Abnormal   Collection Time    09/02/12  5:00 AM      Result Value Range   Sodium  127 (*) 135 - 145  mEq/L   Potassium 3.6  3.5 - 5.1 mEq/L   Chloride 92 (*) 96 - 112 mEq/L   CO2 28  19 - 32 mEq/L   Glucose, Bld 138 (*) 70 - 99 mg/dL   BUN 5 (*) 6 - 23 mg/dL   Creatinine, Ser 7.84 (*) 0.50 - 1.10 mg/dL   Calcium 8.5  8.4 - 69.6 mg/dL   Total Protein 6.5  6.0 - 8.3 g/dL   Albumin 2.2 (*) 3.5 - 5.2 g/dL   AST 295 (*) 0 - 37 U/L   ALT 22  0 - 35 U/L   Alkaline Phosphatase 225 (*) 39 - 117 U/L   Total Bilirubin 6.3 (*) 0.3 - 1.2 mg/dL   GFR calc non Af Amer >90  >90 mL/min   GFR calc Af Amer >90  >90 mL/min   Comment: (NOTE)     The eGFR has been calculated using the CKD EPI equation.     This calculation has not been validated in all clinical situations.     eGFR's persistently <90 mL/min signify possible Chronic Kidney     Disease.  CBC     Status: Abnormal   Collection Time    09/02/12  5:00 AM      Result Value Range   WBC 8.0  4.0 - 10.5 K/uL   RBC 2.13 (*) 3.87 - 5.11 MIL/uL   Hemoglobin 6.7 (*) 12.0 - 15.0 g/dL   Comment: REPEATED TO VERIFY     CRITICAL RESULT CALLED TO, READ BACK BY AND VERIFIED WITH:     HILL R.,RN 09/02/12 0707 BY JONESJ   HCT 20.8 (*) 36.0 - 46.0 %   MCV 97.7  78.0 - 100.0 fL   MCH 31.5  26.0 - 34.0 pg   MCHC 32.2  30.0 - 36.0 g/dL   RDW 28.4 (*) 13.2 - 44.0 %   Platelets 74 (*) 150 - 400 K/uL   Comment: CONSISTENT WITH PREVIOUS RESULT  MAGNESIUM     Status: None   Collection Time    09/02/12  5:00 AM      Result Value Range   Magnesium 1.7  1.5 - 2.5 mg/dL  AMMONIA     Status: Abnormal   Collection Time    09/02/12  6:30 AM      Result Value Range   Ammonia 65 (*) 11 - 60 umol/L  TYPE AND SCREEN     Status: None   Collection Time    09/02/12  9:15 AM      Result Value Range   ABO/RH(D) O NEG     Antibody Screen NEG     Sample Expiration 09/05/2012     Unit Number [id number]     Blood Component Type RBC LR PHER1     Unit division 00     Status of Unit ISSUED     Transfusion Status OK TO TRANSFUSE     Crossmatch Result Compatible      Unit Number [id number]     Blood Component Type RED CELLS,LR     Unit division 00     Status of Unit ALLOCATED     Transfusion Status OK TO TRANSFUSE     Crossmatch Result Compatible    PREPARE RBC (CROSSMATCH)     Status: None   Collection Time    09/02/12  9:15 AM      Result Value Range   Order Confirmation ORDER PROCESSED BY BLOOD BANK  ABO/RH     Status: None   Collection Time    09/02/12  9:15 AM      Result Value Range   ABO/RH(D) O NEG      No results found.  Positive for anxiety, bad mood, depression and sleep disturbance Blood pressure 98/46, pulse 95, temperature 97.2 F (36.2 C), temperature source Oral, resp. rate 18, height 5\' 6"  (1.676 m), weight 65.176 kg (143 lb 11 oz), SpO2 98.00%.   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 09/02/2012, 4:31 PM

## 2012-09-02 NOTE — Progress Notes (Signed)
CRITICAL VALUE ALERT  Critical value received:  Hemoglobin 6.7   Date of notification:  09/02/2012  Time of notification:  0710  Critical value read back:yes  Nurse who received alert:  Doristine Devoid, RN   MD notified (1st page):  Janee Morn, D  Time of first page:  0710  MD notified (2nd page):  Time of second page:  Responding MD:    Time MD responded:  MD placed new orders on Patient.  Will implement new orders and discuss with on coming RN  Jameya Pontiff HIll, RN  [phone]

## 2012-09-03 DIAGNOSIS — F329 Major depressive disorder, single episode, unspecified: Secondary | ICD-10-CM | POA: Diagnosis present

## 2012-09-03 DIAGNOSIS — F1010 Alcohol abuse, uncomplicated: Secondary | ICD-10-CM

## 2012-09-03 DIAGNOSIS — K7460 Unspecified cirrhosis of liver: Secondary | ICD-10-CM

## 2012-09-03 LAB — COMPREHENSIVE METABOLIC PANEL
ALT: 24 U/L (ref 0–35)
Alkaline Phosphatase: 234 U/L — ABNORMAL HIGH (ref 39–117)
BUN: 3 mg/dL — ABNORMAL LOW (ref 6–23)
CO2: 27 mEq/L (ref 19–32)
GFR calc Af Amer: >90 mL/min (ref >90)
GFR calc non Af Amer: >90 mL/min (ref >90)
Glucose, Bld: 126 mg/dL — ABNORMAL HIGH (ref 70–99)
Potassium: 3.6 mEq/L (ref 3.5–5.1)
Sodium: 129 mEq/L — ABNORMAL LOW (ref 135–145)
Total Bilirubin: 6.8 mg/dL — ABNORMAL HIGH (ref 0.3–1.2)
Total Protein: 6.7 g/dL (ref 6.0–8.3)

## 2012-09-03 LAB — CBC
MCHC: 32.9 g/dL (ref 30.0–36.0)
Platelets: 74 10*3/uL — ABNORMAL LOW (ref 150–400)
RDW: 21.9 % — ABNORMAL HIGH (ref 11.5–15.5)
WBC: 8.8 10*3/uL (ref 4.0–10.5)

## 2012-09-03 LAB — TYPE AND SCREEN
Antibody Screen: NEGATIVE
Unit division: 0

## 2012-09-03 LAB — GLUCOSE, CAPILLARY: Glucose-Capillary: 127 mg/dL — ABNORMAL HIGH (ref 70–99)

## 2012-09-03 LAB — MAGNESIUM: Magnesium: 1.8 mg/dL (ref 1.5–2.5)

## 2012-09-03 LAB — BODY FLUID CULTURE: Culture: NO GROWTH

## 2012-09-03 MED ORDER — FOLIC ACID 1 MG PO TABS
1.0000 mg | ORAL_TABLET | Freq: Every day | ORAL | Status: DC
  Administered 2012-09-03 – 2012-09-05 (×3): 1 mg via ORAL
  Filled 2012-09-03 (×4): qty 1

## 2012-09-03 MED ORDER — LACTULOSE 10 GM/15ML PO SOLN
30.0000 g | Freq: Three times a day (TID) | ORAL | Status: DC
  Administered 2012-09-03 – 2012-09-05 (×8): 30 g via ORAL
  Filled 2012-09-03 (×9): qty 45

## 2012-09-03 MED ORDER — PANTOPRAZOLE SODIUM 40 MG PO TBEC
40.0000 mg | DELAYED_RELEASE_TABLET | Freq: Every day | ORAL | Status: DC
  Administered 2012-09-03 – 2012-09-05 (×3): 40 mg via ORAL
  Filled 2012-09-03 (×3): qty 1

## 2012-09-03 MED ORDER — VITAMIN B-1 100 MG PO TABS
100.0000 mg | ORAL_TABLET | Freq: Every day | ORAL | Status: DC
  Administered 2012-09-03 – 2012-09-05 (×3): 100 mg via ORAL
  Filled 2012-09-03 (×4): qty 1

## 2012-09-03 NOTE — Progress Notes (Signed)
[facility] PROGRESS NOTE  [name] AVW:[id number] DOB: [birth year] DOA: 08/31/2012 PCP: Gabriel Cirri, DO  Assessment/Plan: #1 acute encephalopathy/probable hepatic encephalopathy Likely secondary to hepatic encephalopathy. Patient more alert today. Patient jaundiced. Ammonia elevated. Continue lactulose and Xifaxan. Follow.  #2 Decompensated cirrhosis/Ascites Patient had presented with distended abdomen and lower extremity edema. Lower extremity edema with some improvement. Patient is status post diagnostic and therapeutic paracentesis. Cultures are negative. Doubt SBP. D/c IV cefotaxime. Patient however afebrile. Continue lactulose, Xifaxan, spironolactone, Lasix, nadolol.  GI following and appreciate input and recommendations.  #3 ongoing alcohol dependence Continue Ativan  withdrawal protocol. Alcohol cessation.  #4 primary sclerosing cholangitis Per gastroenterology.  #5 fevers Questionable etiology. Urinalysis was worrisome for UTI however, urine cultures with multiple bacterial morphotypes. Chest x-ray is negative for any acute infiltrates. Paracentesis was negative for SBP. Blood cultures are pending. D/C empiric IV cefotaxime. Follow.  #6 anemia  Hgb 9.2 from 6.7. No overt GIB. Status post 2 units PRBCs. Follow H&H. stable  #7 hypokalemia Likely secondary to GI losses and diuretics. Replete potassium. Keep magnesium greater than 2. Follow.  #8 thrombocytopenia Likely chronic secondary to cirrhosis. Follow. No bleeding noted.  #9 depression Patient has been seen by psychiatry for suicidal ideation interview patient is depressed secondary to her chronic medical condition. It was felt patient did not need any inpatient criteria. Patient has been started on Prozac for depression and will need outpatient followup with psychiatry.  #10 prophylaxis Protonix for GI prophylaxis. SCDs for DVT prophylaxis.  Code Status: full Family Communication: updated patient, mother,  boyfriend at bedside. Disposition Plan: home when medically stable.   Consultants:  Gastroenterology: Dr. Matthias Hughs 08/31/2012  Psychiatry : Dr. Elsie Saas  09/02/2012  Procedures:  Diagnostic and therapeutic paracentesis 08/31/2012  2 units packed red blood cells 09/02/2012  Antibiotics:  IV cefotaxime 08/31/2012--->09/03/12  HPI/Subjective: Patient is more alert.  Objective: Filed Vitals:   09/03/12 0939  BP: 98/44  Pulse: 96  Temp: 98.5 F (36.9 C)  Resp: 18    Intake/Output Summary (Last 24 hours) at 09/03/12 1231 Last data filed at 09/03/12 0900  Gross per 24 hour  Intake  602.5 ml  Output      0 ml  Net  602.5 ml   Filed Weights   08/31/12 2108 09/01/12 2049 09/03/12 0516  Weight: 65.182 kg (143 lb 11.2 oz) 65.176 kg (143 lb 11 oz) 63.3 kg (139 lb 8.8 oz)    Exam:   General:  More alert.   Cardiovascular: RRR  Respiratory: CTAB  Abdomen: Soft/NT/distended/+BS   Extremities: 1+ bilateral lower extremity edema.  Data Reviewed: Basic Metabolic Panel:  Recent Labs Lab 08/30/12 2133 08/31/12 0705 08/31/12 2038 09/01/12 0600 09/02/12 0500 09/03/12 0500  NA 133* 131* 130* 130* 127* 129*  K 2.6* 2.5* 3.1* 3.6 3.6 3.6  CL 82* 85* 87* 89* 92* 94*  CO2 37* 30 29 32 28 27  GLUCOSE 82 61* 121* 143* 138* 126*  BUN 6 6 6 6  5* 3*  CREATININE 0.47* 0.38* 0.46* 0.53 0.47* 0.40*  CALCIUM 8.6 7.6* 8.2* 8.2* 8.5 8.5  MG  --  1.3*  --  2.1 1.7 1.8   Liver Function Tests:  Recent Labs Lab 08/30/12 2133 08/31/12 0705 09/01/12 0600 09/02/12 0500 09/03/12 0500  AST 135* 109* 154* 114* 93*  ALT 25 20 23 22 24   ALKPHOS 282* 230* 246* 225* 234*  BILITOT 7.7* 6.5* 6.7* 6.3* 6.8*  PROT 8.0 6.6 6.7 6.5 6.7  ALBUMIN 2.8* 2.3*  2.3* 2.2* 2.2*    Recent Labs Lab 08/30/12 2133  LIPASE 16    Recent Labs Lab 08/31/12 0116 09/01/12 0600 09/02/12 0630 09/03/12 0500  AMMONIA 77* 93* 65* 73*   CBC:  Recent Labs Lab 08/30/12 2133  08/31/12 0705 09/01/12 0600 09/02/12 0500 09/03/12 0500  WBC 12.2* 8.1 9.3 8.0 8.8  NEUTROABS 9.8* 6.5  --   --   --   HGB 8.5* 7.2* 7.2* 6.7* 9.2*  HCT 25.3* 21.5* 21.3* 20.8* 28.0*  MCV 92.7 92.7 92.2 97.7 92.1  PLT 149* 100* 90* 74* 74*   Cardiac Enzymes: No results found for this basename: CKTOTAL, CKMB, CKMBINDEX, TROPONINI,  in the last 168 hours BNP (last 3 results) No results found for this basename: PROBNP,  in the last 8760 hours CBG:  Recent Labs Lab 09/03/12 0744  GLUCAP 127*    Recent Results (from the past 240 hour(s))  URINE CULTURE     Status: None   Collection Time    08/31/12  5:00 AM      Result Value Range Status   Specimen Description URINE, CLEAN CATCH   Final   Special Requests CX ADDED AT 0541 ON [id number]   Final   Culture  Setup Time     Final   Value: 08/31/2012 06:09     Performed at [facility]   Colony Count     Final   Value: 30,000 COLONIES/ML     Performed at [facility]   Culture     Final   Value: Multiple bacterial morphotypes present, none predominant. Suggest appropriate recollection if clinically indicated.     Performed at [facility]   Report Status 09/01/2012 FINAL   Final  GRAM STAIN     Status: None   Collection Time    08/31/12  3:22 PM      Result Value Range Status   Specimen Description ASCITIC ABDOMEN FLUID   Final   Special Requests FLUID   Final   Gram Stain     Final   Value: FEW WBC PRESENT, PREDOMINANTLY MONONUCLEAR     NO ORGANISMS SEEN   Report Status 08/31/2012 FINAL   Final  BODY FLUID CULTURE     Status: None   Collection Time    08/31/12  3:22 PM      Result Value Range Status   Specimen Description ASCITIC ABDOMEN FLUID   Final   Special Requests   Final   Gram Stain     Final   Value: FEW WBC PRESENT, PREDOMINANTLY MONONUCLEAR     NO ORGANISMS SEEN     Performed at [facility]   Culture     Final   Value: NO GROWTH 3 DAYS     Performed at [facility]   Report Status 09/03/2012 FINAL   Final  CULTURE, BLOOD (ROUTINE X 2)     Status: None   Collection Time    08/31/12  8:30 PM      Result Value Range Status   Specimen Description BLOOD FOREARM LEFT   Final   Special Requests BOTTLES DRAWN AEROBIC ONLY 2CC   Final   Culture  Setup Time     Final   Value: 09/01/2012 02:21     Performed at [facility]   Culture     Final   Value:        BLOOD CULTURE RECEIVED NO GROWTH TO DATE CULTURE WILL  BE HELD FOR 5 DAYS BEFORE ISSUING A FINAL NEGATIVE REPORT     Performed at [facility]   Report Status PENDING   Incomplete  CULTURE, BLOOD (ROUTINE X 2)     Status: None   Collection Time    08/31/12  8:38 PM      Result Value Range Status   Specimen Description BLOOD LEFT HAND   Final   Special Requests BOTTLES DRAWN AEROBIC ONLY 1.5CC   Final   Culture  Setup Time     Final   Value: 09/01/2012 02:21     Performed at [facility]   Culture     Final   Value:        BLOOD CULTURE RECEIVED NO GROWTH TO DATE CULTURE WILL BE HELD FOR 5 DAYS BEFORE ISSUING A FINAL NEGATIVE REPORT     Performed at [facility]   Report Status PENDING   Incomplete     Studies: No results found.  Scheduled Meds: . cefoTAXime (CLAFORAN) IV  1 g Intravenous Q8H  . FLUoxetine  10 mg Oral Daily  . folic acid  1 mg Oral Daily  . furosemide  20 mg Intravenous Daily  . lactulose  30 g Oral TID  . multivitamin with minerals  1 tablet Oral Daily  . nadolol  10 mg Oral Daily  . pantoprazole  40 mg Oral Q1200  . potassium chloride  20 mEq Oral BID  . potassium chloride  40 mEq Oral Once  . rifaximin  550 mg Oral BID  . sodium chloride  3 mL Intravenous Q12H  . sodium chloride  3 mL Intravenous Q12H  . spironolactone  100 mg Oral Daily  . thiamine  100 mg Oral Daily  . ursodiol  300 mg Oral BID   Continuous Infusions:   Principal Problem:   Acute encephalopathy Active Problems:   Ascites   Jaundice   Primary  sclerosing cholangitis   Edema   UTI (urinary tract infection)   EtOH dependence   Encephalopathy, hepatic   Anemia   Suicidal ideation   Depression    Time spent: > 35 mins    [facility]  [facility] Pager [phone]. If 7PM-7AM, please contact night-coverage at [email or web address], password [email or web address] 09/03/2012, 12:31 PM  LOS: 3 days

## 2012-09-04 DIAGNOSIS — K7290 Hepatic failure, unspecified without coma: Secondary | ICD-10-CM

## 2012-09-04 LAB — COMPREHENSIVE METABOLIC PANEL
AST: 77 U/L — ABNORMAL HIGH (ref 0–37)
Albumin: 2 g/dL — ABNORMAL LOW (ref 3.5–5.2)
Calcium: 8.5 mg/dL (ref 8.4–10.5)
Creatinine, Ser: 0.38 mg/dL — ABNORMAL LOW (ref 0.50–1.10)
Total Protein: 6.6 g/dL (ref 6.0–8.3)

## 2012-09-04 LAB — CBC
HCT: 25.6 % — ABNORMAL LOW (ref 36.0–46.0)
Hemoglobin: 8.6 g/dL — ABNORMAL LOW (ref 12.0–15.0)
MCH: 31.3 pg (ref 26.0–34.0)
MCV: 93.1 fL (ref 78.0–100.0)
RBC: 2.75 MIL/uL — ABNORMAL LOW (ref 3.87–5.11)

## 2012-09-04 LAB — AMMONIA: Ammonia: 82 umol/L — ABNORMAL HIGH (ref 11–60)

## 2012-09-04 MED ORDER — FUROSEMIDE 20 MG PO TABS
20.0000 mg | ORAL_TABLET | Freq: Every day | ORAL | Status: DC
  Administered 2012-09-05: 20 mg via ORAL
  Filled 2012-09-04: qty 1

## 2012-09-04 MED ORDER — POTASSIUM CHLORIDE CRYS ER 20 MEQ PO TBCR: 40.0000 meq | EXTENDED_RELEASE_TABLET | Freq: Once | ORAL | Status: DC

## 2012-09-04 NOTE — Progress Notes (Signed)
[facility] PROGRESS NOTE  [name] ZOX:[id number] DOB: 16-Dec-1970 DOA: 08/31/2012 PCP: Gabriel Cirri, DO  Assessment/Plan: #1 Hepatic encephalopathy Likely secondary to hepatic encephalopathy. Patient more alert today and likely close to baseline. Patient jaundiced. Ammonia elevated. Continue lactulose and Xifaxan. Follow.  #2 Decompensated cirrhosis/Ascites Patient had presented with distended abdomen and lower extremity edema. Lower extremity edema with some improvement. Patient is status post diagnostic and therapeutic paracentesis. Cultures are negative. Doubt SBP. Patient s/p IV cefotaxime x 3 days. Patient however afebrile. Continue lactulose, Xifaxan, spironolactone, Lasix, nadolol.  GI following and appreciate input and recommendations.  #3 ongoing alcohol dependence Continue Ativan  withdrawal protocol. Alcohol cessation. SW to provide resources.   #4 primary sclerosing cholangitis Per gastroenterology.  #5 fevers Questionable etiology. Urinalysis was worrisome for UTI however urine cultures with multiple bacterial morphotypes. Chest x-ray is negative for any acute infiltrates. Paracentesis was negative for SBP. Blood cultures are pending. Afebrile. S/p 3 days IV cefotaxime. Follow.   #6 anemia  Hgb 8.6 today. No overt GIB. s/p 2 units PRBCs. Follow H&H. stable  #7 hypokalemia Likely secondary to GI losses and diuretics. Replete potassium. Keep magnesium greater than 2. Follow.  #8 thrombocytopenia Likely chronic secondary to cirrhosis. Follow. No bleeding noted.  #9 Depression Per psychiatry patient depressed and not suicidial. No need for inpatient hospitalization. Patient started on prozac. SW to provide info for outpatient psych follow up.  #10 prophylaxis Protonix for GI prophylaxis. SCDs for DVT prophylaxis.  Code Status: full Family Communication: updated patient no family present. Disposition Plan: home hopefully tomorrow if  stable.   Consultants:  Gastroenterology: Dr. Matthias Hughs 08/31/2012  Psychiatry Dr Elsie Saas  09/02/12  Procedures:  Diagnostic and therapeutic paracentesis 08/31/2012  2 units PRBCs  Antibiotics:  IV cefotaxime 08/31/2012--->09/03/12  HPI/Subjective: Patient is more alert. No complaints.  Objective: Filed Vitals:   09/04/12 0918  BP: 102/53  Pulse: 93  Temp: 98.9 F (37.2 C)  Resp: 18    Intake/Output Summary (Last 24 hours) at 09/04/12 0942 Last data filed at 09/04/12 0900  Gross per 24 hour  Intake    720 ml  Output      0 ml  Net    720 ml   Filed Weights   08/31/12 2108 09/01/12 2049 09/03/12 0516  Weight: 65.182 kg (143 lb 11.2 oz) 65.176 kg (143 lb 11 oz) 63.3 kg (139 lb 8.8 oz)    Exam:   General:  More alert.   Cardiovascular: RRR  Respiratory: CTAB  Abdomen: Soft/NT/distended/+BS   Extremities: 1+ bilateral lower extremity edema.  Data Reviewed: Basic Metabolic Panel:  Recent Labs Lab 08/30/12 2133 08/31/12 0705 08/31/12 2038 09/01/12 0600 09/02/12 0500 09/03/12 0500 09/04/12 0455  NA 133* 131* 130* 130* 127* 129* 129*  K 2.6* 2.5* 3.1* 3.6 3.6 3.6 3.5  CL 82* 85* 87* 89* 92* 94* 95*  CO2 37* 30 29 32 28 27 27   GLUCOSE 82 61* 121* 143* 138* 126* 124*  BUN 6 6 6 6  5* 3* 3*  CREATININE 0.47* 0.38* 0.46* 0.53 0.47* 0.40* 0.38*  CALCIUM 8.6 7.6* 8.2* 8.2* 8.5 8.5 8.5  MG  --  1.3*  --  2.1 1.7 1.8  --    Liver Function Tests:  Recent Labs Lab 08/31/12 0705 09/01/12 0600 09/02/12 0500 09/03/12 0500 09/04/12 0455  AST 109* 154* 114* 93* 77*  ALT 20 23 22 24 22   ALKPHOS 230* 246* 225* 234* 218*  BILITOT 6.5* 6.7* 6.3* 6.8* 6.5*  PROT 6.6  6.7 6.5 6.7 6.6  ALBUMIN 2.3* 2.3* 2.2* 2.2* 2.0*    Recent Labs Lab 08/30/12 2133  LIPASE 16    Recent Labs Lab 08/31/12 0116 09/01/12 0600 09/02/12 0630 09/03/12 0500 09/04/12 0455  AMMONIA 77* 93* 65* 73* 82*   CBC:  Recent Labs Lab 08/30/12 2133 08/31/12 0705  09/01/12 0600 09/02/12 0500 09/03/12 0500 09/04/12 0455  WBC 12.2* 8.1 9.3 8.0 8.8 7.9  NEUTROABS 9.8* 6.5  --   --   --   --   HGB 8.5* 7.2* 7.2* 6.7* 9.2* 8.6*  HCT 25.3* 21.5* 21.3* 20.8* 28.0* 25.6*  MCV 92.7 92.7 92.2 97.7 92.1 93.1  PLT 149* 100* 90* 74* 74* 79*   Cardiac Enzymes: No results found for this basename: CKTOTAL, CKMB, CKMBINDEX, TROPONINI,  in the last 168 hours BNP (last 3 results) No results found for this basename: PROBNP,  in the last 8760 hours CBG:  Recent Labs Lab 09/03/12 0744  GLUCAP 127*    Recent Results (from the past 240 hour(s))  URINE CULTURE     Status: None   Collection Time    08/31/12  5:00 AM      Result Value Range Status   Specimen Description URINE, CLEAN CATCH   Final   Special Requests CX ADDED AT 0541 ON [id number]   Final   Culture  Setup Time     Final   Value: 08/31/2012 06:09     Performed at [facility]   Colony Count     Final   Value: 30,000 COLONIES/ML     Performed at [facility]   Culture     Final   Value: Multiple bacterial morphotypes present, none predominant. Suggest appropriate recollection if clinically indicated.     Performed at [facility]   Report Status 09/01/2012 FINAL   Final  GRAM STAIN     Status: None   Collection Time    08/31/12  3:22 PM      Result Value Range Status   Specimen Description ASCITIC ABDOMEN FLUID   Final   Special Requests FLUID   Final   Gram Stain     Final   Value: FEW WBC PRESENT, PREDOMINANTLY MONONUCLEAR     NO ORGANISMS SEEN   Report Status 08/31/2012 FINAL   Final  BODY FLUID CULTURE     Status: None   Collection Time    08/31/12  3:22 PM      Result Value Range Status   Specimen Description ASCITIC ABDOMEN FLUID   Final   Special Requests   Final   Gram Stain     Final   Value: FEW WBC PRESENT, PREDOMINANTLY MONONUCLEAR     NO ORGANISMS SEEN     Performed at [facility]   Culture     Final   Value: NO GROWTH 3 DAYS      Performed at [facility]   Report Status 09/03/2012 FINAL   Final  CULTURE, BLOOD (ROUTINE X 2)     Status: None   Collection Time    08/31/12  8:30 PM      Result Value Range Status   Specimen Description BLOOD FOREARM LEFT   Final   Special Requests BOTTLES DRAWN AEROBIC ONLY 2CC   Final   Culture  Setup Time     Final   Value: 09/01/2012 02:21     Performed at [facility]  Final   Value:        BLOOD CULTURE RECEIVED NO GROWTH TO DATE CULTURE WILL BE HELD FOR 5 DAYS BEFORE ISSUING A FINAL NEGATIVE REPORT     Performed at [facility]   Report Status PENDING   Incomplete  CULTURE, BLOOD (ROUTINE X 2)     Status: None   Collection Time    08/31/12  8:38 PM      Result Value Range Status   Specimen Description BLOOD LEFT HAND   Final   Special Requests BOTTLES DRAWN AEROBIC ONLY 1.5CC   Final   Culture  Setup Time     Final   Value: 09/01/2012 02:21     Performed at [facility]   Culture     Final   Value:        BLOOD CULTURE RECEIVED NO GROWTH TO DATE CULTURE WILL BE HELD FOR 5 DAYS BEFORE ISSUING A FINAL NEGATIVE REPORT     Performed at [facility]   Report Status PENDING   Incomplete     Studies: No results found.  Scheduled Meds: . FLUoxetine  10 mg Oral Daily  . folic acid  1 mg Oral Daily  . furosemide  20 mg Intravenous Daily  . lactulose  30 g Oral TID  . multivitamin with minerals  1 tablet Oral Daily  . nadolol  10 mg Oral Daily  . pantoprazole  40 mg Oral Q1200  . potassium chloride  20 mEq Oral BID  . potassium chloride  40 mEq Oral Once  . rifaximin  550 mg Oral BID  . sodium chloride  3 mL Intravenous Q12H  . sodium chloride  3 mL Intravenous Q12H  . spironolactone  100 mg Oral Daily  . thiamine  100 mg Oral Daily  . ursodiol  300 mg Oral BID   Continuous Infusions:   Principal Problem:   Acute encephalopathy Active Problems:   Ascites   Jaundice   Primary sclerosing cholangitis    Edema   UTI (urinary tract infection)   EtOH dependence   Encephalopathy, hepatic   Anemia   Suicidal ideation   Depression    Time spent: > 35 mins    [facility]  [facility] Pager [phone]. If 7PM-7AM, please contact night-coverage at [email or web address], password [facility] 09/04/2012, 9:41 AM  LOS: 4 days

## 2012-09-04 NOTE — Clinical Social Work Psych Note (Signed)
Psych CSW reviewed chart and pt disposition with unit CSW.  Psych CSW continuing to follow and will provide education and resources to pt once medically stable re: ETOH abuse and outpatient MH follow-up.  Please contact Psych CSW once medically stable.  Vickii Penna, LCSWA [phone]  Clinical Social Work

## 2012-09-04 NOTE — Evaluation (Signed)
Occupational Therapy Evaluation and Discharge Summary Patient Details Name: [name] MRN: [id number] DOB: August 27, 1970 Today's Date: 09/04/2012 Time: 5621-3086 OT Time Calculation (min): 30 min  OT Assessment / Plan / Recommendation History of present illness 42 y.o. female with history of primary sclerosing cholangitis, colitis presents with increasing abdominal distal channel lower extremity edema. Patient states she's compliant with her medications. In addition patient's alcohol levels were found to be high. Patient denies any fever chills chest pain or shortness of breath. At this time patient has been admitted for decompensated cirrhosis of liver. Patient also alcohol intoxicated. On exam patient does have distended abdomen and 3+ lower extremity edema. Not sure if patient is compliant with her medications. Patient was just admitted 2 months ago and had paracentesis. Patient denies any nausea vomiting or diarrhea.   Clinical Impression   Pt seen for OT evaluation and found to be fairly I with all basic adls.  This pt's problem is that she waxes and wanes with her functional status bc she appears per mother and chart to be inconsistent with meds and partakes in too much alcohol.  Pt lost licence due to too many DUIs and does live with and care for her 9 year old son.  This therapist has concern for this pt's ability to care for her son adequately.  Will talk to SW about this issue.    OT Assessment  Patient does not need any further OT services    Follow Up Recommendations  Supervision/Assistance - 24 hour;Other (comment) (This pt may need education/rehab re: meds/alcohol)    Barriers to Discharge      Equipment Recommendations  None recommended by OT    Recommendations for Other Services    Frequency       Precautions / Restrictions Precautions Precautions: Fall Precaution Comments: pt has fallen three times in last 6 months Restrictions Weight Bearing Restrictions: No    Pertinent Vitals/Pain Pt not currently c/o pain.    ADL  Eating/Feeding: Performed;Independent Where Assessed - Eating/Feeding: Chair Grooming: Performed;Wash/dry hands;Wash/dry face;Teeth care;Supervision/safety Where Assessed - Grooming: Unsupported standing Upper Body Bathing: Simulated;Set up Where Assessed - Upper Body Bathing: Supported sitting Lower Body Bathing: Simulated;Set up Where Assessed - Lower Body Bathing: Supported sit to stand Upper Body Dressing: Performed;Set up Where Assessed - Upper Body Dressing: Unsupported sitting Lower Body Dressing: Performed;Supervision/safety Where Assessed - Lower Body Dressing: Supported sit to stand Toilet Transfer: Research scientist (life sciences) Method: Other (comment) (walked to bathroom) Acupuncturist: Comfort height toilet;Grab bars Toileting - Clothing Manipulation and Hygiene: Performed;Supervision/safety Where Assessed - Engineer, mining and Hygiene: Standing Tub/Shower Transfer: Engineer, manufacturing Method: Other (comment) (walk in shower) Tub/Shower Transfer Equipment: Walk in shower Transfers/Ambulation Related to ADLs: pt walked in room.  Pt wobbly at times but was able to steady herself. ADL Comments: Pt able to do all basic adls including toileting, grooming, bathing, dressing etc.  Concern is in the area of this pt being able to care for her 44 year old son that lives with her.  Pt has an alcohol problem per the chart and per mother and mother states pt does not take her medications as prescribed.  Will talk to SW about home situationn.    OT Diagnosis:    OT Problem List:   OT Treatment Interventions:     OT Goals(Current goals can be found in the care plan section) Acute Rehab OT Goals Patient Stated Goal: to get back home.  Visit Information  Last OT  Received On: 09/04/12 Assistance Needed: +1 History of Present Illness: 42 y.o. female with  history of primary sclerosing cholangitis, colitis presents with increasing abdominal distal channel lower extremity edema. Patient states she's compliant with her medications. In addition patient's alcohol levels were found to be high. Patient denies any fever chills chest pain or shortness of breath. At this time patient has been admitted for decompensated cirrhosis of liver. Patient also alcohol intoxicated. On exam patient does have distended abdomen and 3+ lower extremity edema. Not sure if patient is compliant with her medications. Patient was just admitted 2 months ago and had paracentesis. Patient denies any nausea vomiting or diarrhea.       Prior Functioning     Home Living Family/patient expects to be discharged to:: Private residence Living Arrangements: Children Available Help at Discharge: Family Type of Home: House Home Access: Level entry Home Layout: One level Home Equipment: None Additional Comments: mother from [place] can stay short time if needed. Prior Function Level of Independence: Independent Comments: Pt states I taking care of self.  pt would sleep long periods at a time, sometimes does not feel well and this therapist is concerned about pt caring for her 38 yr old son. Communication Communication: No difficulties Dominant Hand: Right         Vision/Perception Vision - History Baseline Vision: No visual deficits Patient Visual Report: No change from baseline Vision - Assessment Vision Assessment: Vision not tested Perception Perception: Within Functional Limits   Cognition  Cognition Arousal/Alertness: Awake/alert Behavior During Therapy: WFL for tasks assessed/performed Overall Cognitive Status: Within Functional Limits for tasks assessed    Extremity/Trunk Assessment Upper Extremity Assessment Upper Extremity Assessment: Overall WFL for tasks assessed Lower Extremity Assessment Lower Extremity Assessment: Defer to PT evaluation Cervical /  Trunk Assessment Cervical / Trunk Assessment: Normal     Mobility Bed Mobility Bed Mobility: Supine to Sit;Sit to Supine Supine to Sit: 6: Modified independent (Device/Increase time) Sit to Supine: 6: Modified independent (Device/Increase time) Transfers Transfers: Sit to Stand;Stand to Sit Sit to Stand: 6: Modified independent (Device/Increase time);From bed Stand to Sit: 6: Modified independent (Device/Increase time);To bed Details for Transfer Assistance: no assist needed.     Exercise     Balance     End of Session OT - End of Session Activity Tolerance: Patient tolerated treatment well Patient left: in bed;with call bell/phone within reach Nurse Communication: Mobility status  GO     Hope Budds 09/04/2012, 12:44 PM [phone]

## 2012-09-04 NOTE — Evaluation (Signed)
Physical Therapy One Time Evaluation Patient Details Name: [name] MRN: [id number] DOB: 1970/01/29 Today's Date: 09/04/2012 Time: 6213-0865 PT Time Calculation (min): 11 min  PT Assessment / Plan / Recommendation History of Present Illness  42 y.o. female with history of primary sclerosing cholangitis, colitis presents with increasing abdominal distal channel lower extremity edema. Patient states she's compliant with her medications. In addition patient's alcohol levels were found to be high. Patient denies any fever chills chest pain or shortness of breath. At this time patient has been admitted for decompensated cirrhosis of liver. Patient also alcohol intoxicated. On exam patient does have distended abdomen and 3+ lower extremity edema. Not sure if patient is compliant with her medications. Patient was just admitted 2 months ago and had paracentesis. Patient denies any nausea vomiting or diarrhea.  Clinical Impression  Patient evaluated by Physical Therapy with no further acute PT needs identified. All education has been completed and the patient has no further questions. See below for any follow-up Physial Therapy or equipment needs. Pt reports occasionally feeling off balance at home however states this is always due to ammonia levels and she either goes to bed or to the MD.  Pt reports feeling at baseline with mobility today.  PT is signing off. Thank you for this referral.     PT Assessment  Patent does not need any further PT services    Follow Up Recommendations  No PT follow up    Does the patient have the potential to tolerate intense rehabilitation      Barriers to Discharge        Equipment Recommendations  None recommended by PT    Recommendations for Other Services     Frequency      Precautions / Restrictions Precautions Precautions: Fall   Pertinent Vitals/Pain n/a      Mobility  Bed Mobility Bed Mobility: Supine to Sit;Sit to Supine Supine to Sit: 6:  Modified independent (Device/Increase time) Sit to Supine: 6: Modified independent (Device/Increase time) Transfers Transfers: Sit to Stand;Stand to Sit Sit to Stand: 6: Modified independent (Device/Increase time);From bed Stand to Sit: 6: Modified independent (Device/Increase time);To bed Ambulation/Gait Ambulation/Gait Assistance: 5: Supervision Ambulation Distance (Feet): 400 Feet Assistive device: None Ambulation/Gait Assistance Details: occasional unsteady gait however no LOB, pt reports her baseline Gait Pattern: Step-through pattern;Wide base of support Gait velocity: WFL    Exercises     PT Diagnosis:    PT Problem List:   PT Treatment Interventions:       PT Goals(Current goals can be found in the care plan section) Acute Rehab PT Goals PT Goal Formulation: No goals set, d/c therapy  Visit Information  Last PT Received On: 09/04/12 Assistance Needed: +1 History of Present Illness: 42 y.o. female with history of primary sclerosing cholangitis, colitis presents with increasing abdominal distal channel lower extremity edema. Patient states she's compliant with her medications. In addition patient's alcohol levels were found to be high. Patient denies any fever chills chest pain or shortness of breath. At this time patient has been admitted for decompensated cirrhosis of liver. Patient also alcohol intoxicated. On exam patient does have distended abdomen and 3+ lower extremity edema. Not sure if patient is compliant with her medications. Patient was just admitted 2 months ago and had paracentesis. Patient denies any nausea vomiting or diarrhea.       Prior Functioning  Home Living Family/patient expects to be discharged to:: Private residence Living Arrangements: Children Type of Home: House Home Access:  Level entry Home Layout: One level Home Equipment: None Prior Function Level of Independence: Independent Communication Communication: No difficulties    Cognition   Cognition Arousal/Alertness: Awake/alert Behavior During Therapy: WFL for tasks assessed/performed Overall Cognitive Status: Within Functional Limits for tasks assessed    Extremity/Trunk Assessment Lower Extremity Assessment Lower Extremity Assessment: Overall WFL for tasks assessed   Balance    End of Session PT - End of Session Activity Tolerance: Patient tolerated treatment well Patient left: in bed;with call bell/phone within reach;with nursing/sitter in room  GP     Rumaldo Difatta,KATHrine E 09/04/2012, 9:43 AM Zenovia Jarred, PT, DPT 09/04/2012 Pager: [phone]

## 2012-09-05 DIAGNOSIS — F329 Major depressive disorder, single episode, unspecified: Secondary | ICD-10-CM

## 2012-09-05 LAB — CBC
HCT: 25.4 % — ABNORMAL LOW (ref 36.0–46.0)
Hemoglobin: 8.4 g/dL — ABNORMAL LOW (ref 12.0–15.0)
MCHC: 33.1 g/dL (ref 30.0–36.0)

## 2012-09-05 LAB — BASIC METABOLIC PANEL
BUN: 4 mg/dL — ABNORMAL LOW (ref 6–23)
Chloride: 101 mEq/L (ref 96–112)
GFR calc Af Amer: >90 mL/min (ref >90)
Glucose, Bld: 133 mg/dL — ABNORMAL HIGH (ref 70–99)
Potassium: 4.3 mEq/L (ref 3.5–5.1)

## 2012-09-05 MED ORDER — FLUOXETINE HCL 10 MG PO CAPS: 10.0000 mg | ORAL_CAPSULE | Freq: Every day | ORAL | Status: DC

## 2012-09-05 MED ORDER — THIAMINE HCL 100 MG PO TABS: 100.0000 mg | ORAL_TABLET | Freq: Every day | ORAL | Status: DC

## 2012-09-05 MED ORDER — RIFAXIMIN 550 MG PO TABS: 550.0000 mg | ORAL_TABLET | Freq: Two times a day (BID) | ORAL | Status: DC

## 2012-09-05 MED ORDER — PNEUMOCOCCAL VAC POLYVALENT 25 MCG/0.5ML IJ INJ
0.5000 mL | INJECTION | Freq: Once | INTRAMUSCULAR | Status: AC
  Administered 2012-09-05: 0.5 mL via INTRAMUSCULAR
  Filled 2012-09-05 (×2): qty 0.5

## 2012-09-05 MED ORDER — FOLIC ACID 1 MG PO TABS: 1.0000 mg | ORAL_TABLET | Freq: Every day | ORAL | Status: DC

## 2012-09-05 MED ORDER — LACTULOSE 10 GM/15ML PO SOLN: 30.0000 g | Freq: Three times a day (TID) | ORAL | Status: DC

## 2012-09-05 MED ORDER — ADULT MULTIVITAMIN W/MINERALS CH: 1.0000 | ORAL_TABLET | Freq: Every day | ORAL | Status: DC

## 2012-09-05 MED ORDER — FUROSEMIDE 20 MG PO TABS: 20.0000 mg | ORAL_TABLET | Freq: Every day | ORAL | Status: DC

## 2012-09-05 NOTE — Clinical Social Work Psych Note (Signed)
Psych CSW assessed pt at bedside.  (full assessment to follow)   Pt was given resources and community referrals for detox (not needed at this time), inpatient residential treatment (unwanted at this time) and intensive outpatient treatment (requested).  Pt was educated on the process of treatment, wait times and how to navigate the resources.    Pt reports working with [facility] re: liver transplant and treatment for ETOH abuse.    Psych CSW signing off.  Vickii Penna, LCSWA [phone]  Clinical Social Work

## 2012-09-05 NOTE — Progress Notes (Signed)
[facility] Progress Note  Subjective: Patient much more alert and cognizant today oriented x4  Objective: Vital signs in last 24 hours: Temp:  [99 F (37.2 C)-99.8 F (37.7 C)] 99 F (37.2 C) (09/03 0615) Pulse Rate:  [90-95] 90 (09/03 0615) Resp:  [16-18] 16 (09/03 0615) BP: (92-115)/(44-67) 103/47 mmHg (09/03 0615) SpO2:  [95 %-99 %] 99 % (09/03 0615) Weight:  [64 kg (141 lb 1.5 oz)] 64 kg (141 lb 1.5 oz) (09/02 2135) Weight change:    PE: Moderately jaundiced, no asterixis  Lab Results: Results for orders placed during the hospital encounter of 08/31/12 (from the past 24 hour(s))  BASIC METABOLIC PANEL     Status: Abnormal   Collection Time    09/05/12  5:50 AM      Result Value Range   Sodium 133 (*) 135 - 145 mEq/L   Potassium 4.3  3.5 - 5.1 mEq/L   Chloride 101  96 - 112 mEq/L   CO2 24  19 - 32 mEq/L   Glucose, Bld 133 (*) 70 - 99 mg/dL   BUN 4 (*) 6 - 23 mg/dL   Creatinine, Ser 1.61 (*) 0.50 - 1.10 mg/dL   Calcium 8.4  8.4 - 09.6 mg/dL   GFR calc non Af Amer >90  >90 mL/min   GFR calc Af Amer >90  >90 mL/min  CBC     Status: Abnormal   Collection Time    09/05/12  5:50 AM      Result Value Range   WBC 8.9  4.0 - 10.5 K/uL   RBC 2.68 (*) 3.87 - 5.11 MIL/uL   Hemoglobin 8.4 (*) 12.0 - 15.0 g/dL   HCT 04.5 (*) 40.9 - 81.1 %   MCV 94.8  78.0 - 100.0 fL   MCH 31.3  26.0 - 34.0 pg   MCHC 33.1  30.0 - 36.0 g/dL   RDW 91.4 (*) 78.2 - 95.6 %   Platelets 88 (*) 150 - 400 K/uL    Studies/Results: No results found.    Assessment: 1. Alcoholic liver 2. Primary sclerosing cholangitis 3. Hepatic encephalopathy 4. Ascites.  Plan:  1. Patient probably okay for discharge. 2. Needs followup with primary gastroenterologist Dr. Bosie Clos who is facilitating a possible transplant evaluation 3. Would continue current combination of rifaximin in lactulose which seems to be working well for her. 4. Above all abstinence from alcohol    Cathryn Gallery  C 09/05/2012, 10:31 AM

## 2012-09-05 NOTE — Progress Notes (Signed)
Discussed discharge instructions and medications with pt. Pt showed no barriers to discharge. IV removed previously. Pt waiting on ride home by boyfriend. Assessment unchanged from morning.

## 2012-09-05 NOTE — Discharge Summary (Signed)
Physician Discharge Summary  [name] FAO:[id number] DOB: Mar 10, 1970 DOA: 08/31/2012  PCP: Gabriel Cirri, DO  Admit date: 08/31/2012 Discharge date: 09/05/2012  Time spent: 32 minutes  Recommendations for Outpatient Follow-up:  1. Follow up recheck ammonia levels 2. Recheck BMP on follow up  3. Follow up with Dr. Bosie Clos (GI)  4. Go to behavioral health center for further outpatient counseling for depression  Discharge Diagnoses:  Principal Problem:   Acute encephalopathy Active Problems:   Ascites   Jaundice   Primary sclerosing cholangitis   Edema   UTI (urinary tract infection)   EtOH dependence   Encephalopathy, hepatic   Anemia   Suicidal ideation   Depression   Discharge Condition: stable  Diet recommendation: hepatic  Filed Weights   09/01/12 2049 09/03/12 0516 09/04/12 2135  Weight: 65.176 kg (143 lb 11 oz) 63.3 kg (139 lb 8.8 oz) 64 kg (141 lb 1.5 oz)    Hospital Course:  #1 Hepatic encephalopathy  Likely secondary to hepatic encephalopathy. Patient alert and oriented today and back to her baseline. Patient jaundiced.  Pt was started on Xifaxan and the lactulose dose was increased.  Pt tolerated this and improved mental status to baseline.  Will send patient home on current dose of lactulose and rifaximin. Follow Outpatient.    Pt was seen by GI who said it was OK to discharge home today on current medications.    #2 Decompensated cirrhosis/Ascites  Patient had presented with distended abdomen and lower extremity edema. Lower extremity edema with some improvement. Patient is status post diagnostic and therapeutic paracentesis. Cultures are negative. Doubt SBP. Patient s/p IV cefotaxime x 3 days. Patient however afebrile. Continue lactulose, Xifaxan, spironolactone, Lasix, nadolol. GI following and appreciate input and recommendations.   #3 ongoing alcohol dependence  Continue Ativan withdrawal protocol. Alcohol cessation. SW to provide resources.   #4  primary sclerosing cholangitis  Per gastroenterology.   #5 fevers  Questionable etiology. Urinalysis was worrisome for UTI however urine cultures with multiple bacterial morphotypes. Chest x-ray is negative for any acute infiltrates. Paracentesis was negative for SBP. Blood cultures are pending. Afebrile. S/p 3 days IV cefotaxime.  #6 anemia  Hgb 8.6 today. No overt GIB. s/p 2 units PRBCs. Follow H&H. stable   #7 hypokalemia  Likely secondary to GI losses and diuretics. Replete potassium. Keep magnesium greater than 2.   #8 thrombocytopenia  Likely chronic secondary to cirrhosis. Follow. No bleeding noted.   #9 Depression  Per psychiatry patient depressed and not suicidial. No need for inpatient hospitalization. Patient started on prozac 10 mg. SW provided info for outpatient psych follow up.  Holding trazodone for now.  Follow up with PCP.   #10 prophylaxis  Protonix for GI prophylaxis. SCDs for DVT prophylaxis.   Code Status: full  Family Communication: updated family in room today.  Disposition Plan: home.   Discharge Exam: Pt without complaints,  She is alert and oriented x 4.  She is in no distress.  She reported some nausea after taking morning meds.    Filed Vitals:   09/05/12 1031  BP: 97/52  Pulse: 90  Temp: 99 F (37.2 C)  Resp: 18    General: jaundiced, awake, aler, no distress Cardiovascular: normal s1, s2 sounds Respiratory: BBS clear ABD: mild distension noted, soft, normal BS, nontender  Discharge Instructions  Discharge Orders   Future Orders Complete By Expires   Call MD for:  difficulty breathing, headache or visual disturbances  As directed    Call MD  for:  persistant nausea and vomiting  As directed    Call MD for:  severe uncontrolled pain  As directed    Call MD for:  temperature >100.4  As directed    Diet - low sodium heart healthy  As directed    Discharge instructions  As directed    Comments:     Please avoid alcohol and tobacco  products. Return if symptoms worsen, don't improve or new problems develop.   Increase activity slowly  As directed        Medication List    STOP taking these medications       baclofen 20 MG tablet  Commonly known as:  LIORESAL     traZODone 100 MG tablet  Commonly known as:  DESYREL      TAKE these medications       FLUoxetine 10 MG capsule  Commonly known as:  PROZAC  Take 1 capsule (10 mg total) by mouth daily.     folic acid 1 MG tablet  Commonly known as:  FOLVITE  Take 1 tablet (1 mg total) by mouth daily.     furosemide 20 MG tablet  Commonly known as:  LASIX  Take 1 tablet (20 mg total) by mouth daily.     lactulose 10 GM/15ML solution  Commonly known as:  CHRONULAC  Take 45 mLs (30 g total) by mouth 3 (three) times daily.     levonorgestrel 20 MCG/24HR IUD  Commonly known as:  MIRENA  1 each by Intrauterine route once.     multivitamin with minerals Tabs tablet  Take 1 tablet by mouth daily.     nadolol 20 MG tablet  Commonly known as:  CORGARD  Take 0.5 tablets (10 mg total) by mouth daily.     nicotine 7 mg/24hr patch  Commonly known as:  NICODERM CQ - dosed in mg/24 hr  Place 1 patch onto the skin daily.     potassium chloride 20 MEQ/15ML (10%) solution  Take 15 mLs (20 mEq total) by mouth 2 (two) times daily.     rifaximin 550 MG Tabs tablet  Commonly known as:  XIFAXAN  Take 1 tablet (550 mg total) by mouth 2 (two) times daily.     spironolactone 100 MG tablet  Commonly known as:  ALDACTONE  Take 1 tablet (100 mg total) by mouth daily.     thiamine 100 MG tablet  Take 1 tablet (100 mg total) by mouth daily.     ursodiol 300 MG capsule  Commonly known as:  ACTIGALL  Take 300 mg by mouth 2 (two) times daily.       No Known Allergies     Follow-up Information   Follow up with SCHOOLER,VINCENT C., MD. Schedule an appointment as soon as possible for a visit in 2 weeks. (hospital follow up )    Specialty:  Gastroenterology    Contact information:   [address] [place], [address] [facility], [place] [address] [phone]       Follow up with [facility], DO. Schedule an appointment as soon as possible for a visit in 1 week. (follow up )    Specialty:  Family Medicine   Contact information:   [address] [place] [address] [phone]       Follow up with [facility]. Schedule an appointment as soon as possible for a visit in 1 week.   Specialty:  Behavioral Health  Contact information:   [address] [place] [address] [phone]       The results of significant diagnostics from this hospitalization (including imaging, microbiology, ancillary and laboratory) are listed below for reference.    Significant Diagnostic Studies: US Paracentesis  08/31/2012   *RADIOLOGY REPORT*  Clinical Data: ascites, abdominal pain r/o SBP  ULTRASOUND GUIDED PARACENTESIS  An ultrasound guided paracentesis was thoroughly discussed with the patient and questions answered.  The benefits, risks, alternatives and complications were also discussed.  The patient understands and wishes to proceed with the procedure.  Written consent was obtained.  Ultrasound was performed to localize and mark an adequate pocket of fluid in the right upper quadrant of the abdomen.  The area was then prepped and draped in the normal sterile fashion.  1% Lidocaine was used for local anesthesia.  Under ultrasound guidance a 19 gauge Yueh catheter was introduced.  Paracentesis was performed.  The catheter was removed and a dressing applied.  Complications:  none  Findings:  A total of approximately 170cc of yellow fluid was removed.  A fluid sample was sent for laboratory analysis.  IMPRESSION: Successful ultrasound guided paracentesis yielding 170cc of ascites.  Read By: Pattricia Boss PA-C   Original Report Authenticated By: Judie Petit. Miles Costain, M.D.     Microbiology: Recent Results (from the past 240 hour(s))  URINE CULTURE     Status: None   Collection Time    08/31/12  5:00 AM      Result Value Range Status   Specimen Description URINE, CLEAN CATCH   Final   Special Requests CX ADDED AT 0541 ON [id number]   Final   Culture  Setup Time     Final   Value: 08/31/2012 06:09     Performed at [facility]   Colony Count     Final   Value: 30,000 COLONIES/ML     Performed at [facility]   Culture     Final   Value: Multiple bacterial morphotypes present, none predominant. Suggest appropriate recollection if clinically indicated.     Performed at [facility]   Report Status 09/01/2012 FINAL   Final  GRAM STAIN     Status: None   Collection Time    08/31/12  3:22 PM      Result Value Range Status   Specimen Description ASCITIC ABDOMEN FLUID   Final   Special Requests FLUID   Final   Gram Stain     Final   Value: FEW WBC PRESENT, PREDOMINANTLY MONONUCLEAR     NO ORGANISMS SEEN   Report Status 08/31/2012 FINAL   Final  BODY FLUID CULTURE     Status: None   Collection Time    08/31/12  3:22 PM      Result Value Range Status   Specimen Description ASCITIC ABDOMEN FLUID   Final   Special Requests   Final   Gram Stain     Final   Value: FEW WBC PRESENT, PREDOMINANTLY MONONUCLEAR     NO ORGANISMS SEEN     Performed at [facility]   Culture     Final   Value: NO GROWTH 3 DAYS     Performed at [facility]   Report Status 09/03/2012 FINAL   Final  CULTURE, BLOOD (ROUTINE X 2)     Status: None   Collection Time    08/31/12  8:30 PM      Result Value Range Status  Specimen Description BLOOD FOREARM LEFT   Final   Special Requests BOTTLES DRAWN AEROBIC ONLY 2CC   Final   Culture  Setup Time     Final   Value: 09/01/2012 02:21     Performed at [facility]   Culture     Final   Value:        BLOOD CULTURE RECEIVED NO GROWTH TO DATE CULTURE WILL BE HELD FOR 5 DAYS BEFORE  ISSUING A FINAL NEGATIVE REPORT     Performed at [facility]   Report Status PENDING   Incomplete  CULTURE, BLOOD (ROUTINE X 2)     Status: None   Collection Time    08/31/12  8:38 PM      Result Value Range Status   Specimen Description BLOOD LEFT HAND   Final   Special Requests BOTTLES DRAWN AEROBIC ONLY 1.5CC   Final   Culture  Setup Time     Final   Value: 09/01/2012 02:21     Performed at [facility]   Culture     Final   Value:        BLOOD CULTURE RECEIVED NO GROWTH TO DATE CULTURE WILL BE HELD FOR 5 DAYS BEFORE ISSUING A FINAL NEGATIVE REPORT     Performed at [facility]   Report Status PENDING   Incomplete     Labs: Basic Metabolic Panel:  Recent Labs Lab 08/30/12 2133 08/31/12 0705  09/01/12 0600 09/02/12 0500 09/03/12 0500 09/04/12 0455 09/05/12 0550  NA 133* 131*  < > 130* 127* 129* 129* 133*  K 2.6* 2.5*  < > 3.6 3.6 3.6 3.5 4.3  CL 82* 85*  < > 89* 92* 94* 95* 101  CO2 37* 30  < > 32 28 27 27 24   GLUCOSE 82 61*  < > 143* 138* 126* 124* 133*  BUN 6 6  < > 6 5* 3* 3* 4*  CREATININE 0.47* 0.38*  < > 0.53 0.47* 0.40* 0.38* 0.41*  CALCIUM 8.6 7.6*  < > 8.2* 8.5 8.5 8.5 8.4  MG  --  1.3*  --  2.1 1.7 1.8  --   --   < > = values in this interval not displayed. Liver Function Tests:  Recent Labs Lab 08/31/12 0705 09/01/12 0600 09/02/12 0500 09/03/12 0500 09/04/12 0455  AST 109* 154* 114* 93* 77*  ALT 20 23 22 24 22   ALKPHOS 230* 246* 225* 234* 218*  BILITOT 6.5* 6.7* 6.3* 6.8* 6.5*  PROT 6.6 6.7 6.5 6.7 6.6  ALBUMIN 2.3* 2.3* 2.2* 2.2* 2.0*    Recent Labs Lab 08/30/12 2133  LIPASE 16    Recent Labs Lab 08/31/12 0116 09/01/12 0600 09/02/12 0630 09/03/12 0500 09/04/12 0455  AMMONIA 77* 93* 65* 73* 82*   CBC:  Recent Labs Lab 08/30/12 2133 08/31/12 0705 09/01/12 0600 09/02/12 0500 09/03/12 0500 09/04/12 0455 09/05/12 0550  WBC 12.2* 8.1 9.3 8.0 8.8 7.9 8.9  NEUTROABS 9.8* 6.5  --   --   --   --   --    HGB 8.5* 7.2* 7.2* 6.7* 9.2* 8.6* 8.4*  HCT 25.3* 21.5* 21.3* 20.8* 28.0* 25.6* 25.4*  MCV 92.7 92.7 92.2 97.7 92.1 93.1 94.8  PLT 149* 100* 90* 74* 74* 79* 88*   Cardiac Enzymes: No results found for this basename: CKTOTAL, CKMB, CKMBINDEX, TROPONINI,  in the last 168 hours BNP: BNP (last 3 results) No results found for this basename: PROBNP,  in the  last 8760 hours CBG:  Recent Labs Lab 09/03/12 0744  GLUCAP 127*    I spent 32 mins preparing discharge, reviewing records, counseling patient, arranging follow up, writing prescriptions and dictating summary.    Signed:  Judie Hollick  [facility] 09/05/2012, 12:22 PM

## 2012-09-06 ENCOUNTER — Other Ambulatory Visit (HOSPITAL_COMMUNITY): Payer: Self-pay | Admitting: Internal Medicine

## 2012-09-07 ENCOUNTER — Emergency Department (HOSPITAL_COMMUNITY): Payer: Medicaid Other

## 2012-09-07 ENCOUNTER — Emergency Department (HOSPITAL_COMMUNITY)
Admission: EM | Admit: 2012-09-07 | Discharge: 2012-09-07 | Disposition: A | Discharge status: Home / Self Care | Payer: Medicaid Other | Attending: Emergency Medicine | Admitting: Emergency Medicine

## 2012-09-07 ENCOUNTER — Encounter (HOSPITAL_COMMUNITY): Payer: Self-pay | Admitting: Emergency Medicine

## 2012-09-07 DIAGNOSIS — R188 Other ascites: Secondary | ICD-10-CM

## 2012-09-07 DIAGNOSIS — R141 Gas pain: Secondary | ICD-10-CM | POA: Insufficient documentation

## 2012-09-07 DIAGNOSIS — F411 Generalized anxiety disorder: Secondary | ICD-10-CM | POA: Insufficient documentation

## 2012-09-07 DIAGNOSIS — R17 Unspecified jaundice: Secondary | ICD-10-CM | POA: Diagnosis present

## 2012-09-07 DIAGNOSIS — Z9089 Acquired absence of other organs: Secondary | ICD-10-CM | POA: Insufficient documentation

## 2012-09-07 DIAGNOSIS — F17200 Nicotine dependence, unspecified, uncomplicated: Secondary | ICD-10-CM | POA: Insufficient documentation

## 2012-09-07 DIAGNOSIS — M7989 Other specified soft tissue disorders: Secondary | ICD-10-CM | POA: Insufficient documentation

## 2012-09-07 DIAGNOSIS — Z79899 Other long term (current) drug therapy: Secondary | ICD-10-CM | POA: Insufficient documentation

## 2012-09-07 DIAGNOSIS — R1900 Intra-abdominal and pelvic swelling, mass and lump, unspecified site: Secondary | ICD-10-CM | POA: Insufficient documentation

## 2012-09-07 DIAGNOSIS — K7460 Unspecified cirrhosis of liver: Secondary | ICD-10-CM | POA: Insufficient documentation

## 2012-09-07 DIAGNOSIS — D649 Anemia, unspecified: Secondary | ICD-10-CM

## 2012-09-07 DIAGNOSIS — K8301 Primary sclerosing cholangitis: Secondary | ICD-10-CM | POA: Diagnosis present

## 2012-09-07 DIAGNOSIS — F1020 Alcohol dependence, uncomplicated: Secondary | ICD-10-CM | POA: Insufficient documentation

## 2012-09-07 DIAGNOSIS — R109 Unspecified abdominal pain: Secondary | ICD-10-CM

## 2012-09-07 DIAGNOSIS — Z8719 Personal history of other diseases of the digestive system: Secondary | ICD-10-CM | POA: Insufficient documentation

## 2012-09-07 DIAGNOSIS — R142 Eructation: Secondary | ICD-10-CM | POA: Insufficient documentation

## 2012-09-07 DIAGNOSIS — R609 Edema, unspecified: Secondary | ICD-10-CM | POA: Diagnosis present

## 2012-09-07 LAB — URINALYSIS, ROUTINE W REFLEX MICROSCOPIC
Glucose, UA: NEGATIVE mg/dL
Specific Gravity, Urine: 1.013 (ref 1.005–1.030)
Urobilinogen, UA: 0.2 mg/dL (ref 0.0–1.0)
pH: 6 (ref 5.0–8.0)

## 2012-09-07 LAB — COMPREHENSIVE METABOLIC PANEL
ALT: 24 U/L (ref 0–35)
AST: 77 U/L — ABNORMAL HIGH (ref 0–37)
Albumin: 2.3 g/dL — ABNORMAL LOW (ref 3.5–5.2)
Alkaline Phosphatase: 223 U/L — ABNORMAL HIGH (ref 39–117)
Calcium: 8.8 mg/dL (ref 8.4–10.5)
GFR calc Af Amer: >90 mL/min (ref >90)
Potassium: 3.9 mEq/L (ref 3.5–5.1)
Sodium: 129 mEq/L — ABNORMAL LOW (ref 135–145)
Total Protein: 7.1 g/dL (ref 6.0–8.3)

## 2012-09-07 LAB — CULTURE, BLOOD (ROUTINE X 2): Culture: NO GROWTH

## 2012-09-07 LAB — URINE MICROSCOPIC-ADD ON

## 2012-09-07 LAB — CBC
HCT: 25.7 % — ABNORMAL LOW (ref 36.0–46.0)
Platelets: 124 10*3/uL — ABNORMAL LOW (ref 150–400)
RBC: 2.71 MIL/uL — ABNORMAL LOW (ref 3.87–5.11)
RDW: 23.2 % — ABNORMAL HIGH (ref 11.5–15.5)
WBC: 6.5 10*3/uL (ref 4.0–10.5)

## 2012-09-07 LAB — AMMONIA: Ammonia: 28 umol/L (ref 11–60)

## 2012-09-07 MED ORDER — MORPHINE SULFATE 4 MG/ML IJ SOLN
4.0000 mg | Freq: Once | INTRAMUSCULAR | Status: AC
  Administered 2012-09-07: 4 mg via INTRAVENOUS
  Filled 2012-09-07: qty 1

## 2012-09-07 MED ORDER — FUROSEMIDE 20 MG PO TABS: 40.0000 mg | ORAL_TABLET | Freq: Every day | ORAL | Status: DC

## 2012-09-07 MED ORDER — TRAMADOL HCL 50 MG PO TABS: 50.0000 mg | ORAL_TABLET | Freq: Four times a day (QID) | ORAL | Status: DC | PRN

## 2012-09-07 MED ORDER — FUROSEMIDE 20 MG PO TABS: 80.0000 mg | ORAL_TABLET | Freq: Once | ORAL | Status: DC

## 2012-09-07 MED ORDER — FUROSEMIDE 10 MG/ML IJ SOLN: 40.0000 mg | Freq: Once | INTRAMUSCULAR | Status: DC

## 2012-09-07 MED ORDER — SPIRONOLACTONE 100 MG PO TABS: 200.0000 mg | ORAL_TABLET | Freq: Every day | ORAL | Status: DC

## 2012-09-07 MED ORDER — FUROSEMIDE 10 MG/ML IJ SOLN
40.0000 mg | Freq: Once | INTRAMUSCULAR | Status: AC
  Administered 2012-09-07: 40 mg via INTRAVENOUS
  Filled 2012-09-07: qty 4

## 2012-09-07 MED ORDER — SPIRONOLACTONE 100 MG PO TABS: 100.0000 mg | ORAL_TABLET | Freq: Once | ORAL | Status: DC

## 2012-09-07 NOTE — ED Provider Notes (Addendum)
CSN: [id number]     Arrival date & time 09/07/12  1207 History   First MD Initiated Contact with Patient 09/07/12 1225     Chief Complaint  Patient presents with  . Ascites   (Consider location/radiation/quality/duration/timing/severity/associated sxs/prior Treatment) Patient is a 42 y.o. female presenting with general illness. The history is provided by the patient.  Illness Location:  Swelling in abdomen, legs Quality:  Marked Severity:  Moderate Onset quality:  Sudden Timing:  Constant Progression:  Worsening Chronicity:  Recurrent Context:  Recently released from hospital after having therapeutic paracentesis and increased lactulose for hepatic encephalopathy Relieved by:  Nothing Worsened by:  Nothing Ineffective treatments:  Nothing Associated symptoms: no abdominal pain, no chest pain, no cough, no fever and no shortness of breath     Past Medical History  Diagnosis Date  . Sclerosing cholangitis   . Panic attacks   . Anxiety   . Alcohol abuse   . Ascites   . Cirrhosis of liver   . EtOH dependence 08/31/2012  . Family history of anesthesia complication     " my son had problems "   Past Surgical History  Procedure Laterality Date  . None    . Ercp     Family History  Problem Relation Age of Onset  . CAD Father    History  Substance Use Topics  . Smoking status: Current Every Day Smoker -- 1.00 packs/day for 20 years    Types: Cigarettes  . Smokeless tobacco: Never Used  . Alcohol Use: Yes     Comment: Pt drinks vodka/liquor every weekend, denies daily drinking   OB History   Grav Para Term Preterm Abortions TAB SAB Ect Mult Living                 Review of Systems  Constitutional: Negative for fever.  Respiratory: Negative for cough and shortness of breath.   Cardiovascular: Negative for chest pain.  Gastrointestinal: Negative for abdominal pain.  All other systems reviewed and are negative.    Allergies  Review of patient's allergies indicates  no known allergies.  Home Medications   Current Outpatient Rx  Name  Route  Sig  Dispense  Refill  . FLUoxetine (PROZAC) 10 MG capsule   Oral   Take 1 capsule (10 mg total) by mouth daily.   30 capsule   0   . folic acid (FOLVITE) 1 MG tablet   Oral   Take 1 tablet (1 mg total) by mouth daily.   30 tablet   0   . furosemide (LASIX) 20 MG tablet   Oral   Take 1 tablet (20 mg total) by mouth daily.   30 tablet   0   . ibuprofen (ADVIL,MOTRIN) 200 MG tablet   Oral   Take 600 mg by mouth every 6 (six) hours as needed for pain.         Marland Kitchen lactulose (CHRONULAC) 10 GM/15ML solution   Oral   Take 45 mLs (30 g total) by mouth 3 (three) times daily.   240 mL   0   . Multiple Vitamin (MULTIVITAMIN WITH MINERALS) TABS tablet   Oral   Take 1 tablet by mouth daily.   30 tablet   0   . nadolol (CORGARD) 20 MG tablet   Oral   Take 0.5 tablets (10 mg total) by mouth daily.   30 tablet   0   . potassium chloride 20 MEQ/15ML (10%) solution   Oral  Take 15 mLs (20 mEq total) by mouth 2 (two) times daily.   500 mL   0   . spironolactone (ALDACTONE) 100 MG tablet   Oral   Take 1 tablet (100 mg total) by mouth daily.   30 tablet   0   . thiamine 100 MG tablet   Oral   Take 1 tablet (100 mg total) by mouth daily.   30 tablet   0   . ursodiol (ACTIGALL) 300 MG capsule   Oral   Take 300 mg by mouth 2 (two) times daily.         . rifaximin (XIFAXAN) 550 MG TABS tablet   Oral   Take 1 tablet (550 mg total) by mouth 2 (two) times daily.   60 tablet   0    BP 122/68  Pulse 93  Temp(Src) 98.5 F (36.9 C) (Oral)  Resp 16  SpO2 100% Physical Exam  Nursing note and vitals reviewed. Constitutional: She is oriented to person, place, and time. She appears well-developed and well-nourished. No distress.  HENT:  Head: Normocephalic and atraumatic.  Eyes: EOM are normal. Pupils are equal, round, and reactive to light.  Neck: Normal range of motion. Neck supple.   Cardiovascular: Normal rate and regular rhythm.  Exam reveals no friction rub.   No murmur heard. Pulmonary/Chest: Effort normal and breath sounds normal. No respiratory distress. She has no wheezes. She has no rales.  Abdominal: Soft. She exhibits distension. There is no tenderness. There is no rebound.  Musculoskeletal: Normal range of motion. She exhibits no edema.  Neurological: She is alert and oriented to person, place, and time.  Skin: She is not diaphoretic.    ED Course  Procedures (including critical care time) Labs Review Labs Reviewed  COMPREHENSIVE METABOLIC PANEL - Abnormal; Notable for the following:    Sodium 129 (*)    Chloride 95 (*)    Glucose, Bld 139 (*)    BUN 5 (*)    Creatinine, Ser 0.39 (*)    Albumin 2.3 (*)    AST 77 (*)    Alkaline Phosphatase 223 (*)    Total Bilirubin 6.9 (*)    All other components within normal limits  AMMONIA  LACTIC ACID, PLASMA  URINALYSIS, ROUTINE W REFLEX MICROSCOPIC   Imaging Review No results found.  MDM   1. Ascites   2. Abdominal pain   3. Anemia    1F with hx of sclerosing cholangitis, cirrhosis presents with ascites and abdominal distention. Recent admission for hepatic encephalopathy - had a therapeutic paracentesis in the hospital. Since discharge 2 days ago, marked swelling in legs, arms, abdomen. Patient states she's been off of her nadolol, and is taking her diuretics as instructed. Labs c/w the same. Medicine consulted and prescribed a PO regimen to jump start her diuresis, however IR available for paracentesis. IR unable to complete paracentesis. Will give IV lasix and discharge with medications that the hospitalist, Dr. Rhona Leavens already prescribed.   Dagmar Hait, MD 09/07/12 1622  Of note, UA shows leukocytes. Patient states she had a negative urine culture previously. Had a contaminated sample. Patient doesn't want antibiotics at this time.   Dagmar Hait, MD 09/07/12 1630

## 2012-09-07 NOTE — ED Notes (Signed)
Discharge instructions reviewed with pt. Pt verbalized understanding.   

## 2012-09-07 NOTE — ED Notes (Signed)
Pt c/o ascites and swelling to legs; pt recently discharged on Wednesday for same; pt with hx of liver failure

## 2012-09-07 NOTE — Consult Note (Signed)
[facility] Medical Consultation  Laparis Durrett ZOX:[id number] DOB: Aug 20, 1970 DOA: 09/07/2012 PCP: Berlinda Last   Requesting physician: Emergency Department Date of consultation: 09/09/12 Reason for consultation:   Impression/Recommendations Active Problems:   Ascites   Jaundice   Primary sclerosing cholangitis   Edema  1. Ascites: 1. Hemodynamically stable with good respirations and O2 sats.on RA 2. Renal function appropriate with GFR of over 90 3. Will recommend continuing on a 40:100 lasix:aldactone dose ratio. 4. Recommend in house therapeutic paracentesis 5. Since pt is noticing poor urine output w/ her current diuretic, would increase lasix and aldactone to 80mg  and 200mg , respectively, x 1 day 6. Pt was offered admission, but elects to go home instead with oral diuretics. 7. Case discussed with the ED staff 8. Pt is medically stable for discharge home with close outpatient follow up after paracentesis  Thank you for this consultation.  Chief Complaint: Swelling  HPI:  42yo with hx of cirrhosis, recently discharged 2 days ago for hepatic encephalopathy, improved with lactulose. The patient was discharged home with 20mg  lasix and 100mg  of spironolactone. The patient reports little/no urine output despite taking her diuretics, with resultant increase in ascites. She subsequently presented to the ED seeking further care.  Review of Systems:  Back pain, abd swelling, LE swelling, remainder of 10pt ROS reviewed and are neg  Past Medical History  Diagnosis Date  . Sclerosing cholangitis   . Panic attacks   . Anxiety   . Alcohol abuse   . Ascites   . Cirrhosis of liver   . EtOH dependence 08/31/2012  . Family history of anesthesia complication     " my son had problems "   Past Surgical History  Procedure Laterality Date  . None    . Ercp     Social History:  reports that she has been smoking Cigarettes.  She has a 20 pack-year smoking history. She has never  used smokeless tobacco. She reports that  drinks alcohol. She reports that she uses illicit drugs (Marijuana).  No Known Allergies Family History  Problem Relation Age of Onset  . CAD Father     Prior to Admission medications   Medication Sig Start Date End Date Taking? Authorizing Provider  FLUoxetine (PROZAC) 10 MG capsule Take 1 capsule (10 mg total) by mouth daily. 09/05/12  Yes Clanford Cyndie Mull, MD  folic acid (FOLVITE) 1 MG tablet Take 1 tablet (1 mg total) by mouth daily. 09/05/12  Yes Clanford Cyndie Mull, MD  ibuprofen (ADVIL,MOTRIN) 200 MG tablet Take 600 mg by mouth every 6 (six) hours as needed for pain.   Yes Historical Provider, MD  lactulose (CHRONULAC) 10 GM/15ML solution Take 45 mLs (30 g total) by mouth 3 (three) times daily. 09/05/12  Yes Clanford Cyndie Mull, MD  Multiple Vitamin (MULTIVITAMIN WITH MINERALS) TABS tablet Take 1 tablet by mouth daily. 09/05/12  Yes Clanford Cyndie Mull, MD  nadolol (CORGARD) 20 MG tablet Take 0.5 tablets (10 mg total) by mouth daily. 07/05/12  Yes Shanker Levora Dredge, MD  potassium chloride 20 MEQ/15ML (10%) solution Take 15 mLs (20 mEq total) by mouth 2 (two) times daily. 07/05/12  Yes Shanker Levora Dredge, MD  spironolactone (ALDACTONE) 100 MG tablet Take 1 tablet (100 mg total) by mouth daily. 07/06/12  Yes Shanker Levora Dredge, MD  thiamine 100 MG tablet Take 1 tablet (100 mg total) by mouth daily. 09/05/12  Yes Clanford Cyndie Mull, MD  ursodiol (ACTIGALL) 300 MG capsule Take 300 mg by  mouth 2 (two) times daily.   Yes Historical Provider, MD  furosemide (LASIX) 20 MG tablet Take 2 tablets (40 mg total) by mouth daily. 09/07/12   Jerald Kief, MD  furosemide (LASIX) 20 MG tablet Take 4 tablets (80 mg total) by mouth once. 09/08/12 09/09/12  Jerald Kief, MD  rifaximin (XIFAXAN) 550 MG TABS tablet Take 1 tablet (550 mg total) by mouth 2 (two) times daily. 09/05/12   Clanford Cyndie Mull, MD  spironolactone (ALDACTONE) 100 MG tablet Take 2 tablets (200 mg total) by mouth  daily. 09/08/12 09/09/12  Jerald Kief, MD   Physical Exam: Blood pressure 116/66, pulse 90, temperature 98.5 F (36.9 C), temperature source Oral, resp. rate 16, SpO2 100.00%. Filed Vitals:   09/07/12 1304 09/07/12 1330 09/07/12 1345 09/07/12 1400  BP: 110/65 122/68 113/61 116/66  Pulse: 91 93 90 90  Temp: 98.5 F (36.9 C)     TempSrc: Oral     Resp: 16     SpO2: 100% 100% 100% 100%     General:  Awake, in nad  Eyes: PERRL B  ENT: membranes moist, dentition fair  Neck: trachea midline, neck supple  Cardiovascular: regular, s1, s2  Respiratory: normal resp effort, no wheezing or crackles  Abdomen: distended, ascitic, nontender  Skin: normal skin turgor, no abnormal skin lesions seen  Musculoskeletal: perfused, no clubbing, LE edema  Psychiatric: mood/affect normal // no auditory/visual hallucinations  Neurologic: cn2-12 grossly intact, strength/sensation intact  Labs on Admission:  Basic Metabolic Panel:  Recent Labs Lab 09/01/12 0600 09/02/12 0500 09/03/12 0500 09/04/12 0455 09/05/12 0550 09/07/12 1246  NA 130* 127* 129* 129* 133* 129*  K 3.6 3.6 3.6 3.5 4.3 3.9  CL 89* 92* 94* 95* 101 95*  CO2 32 28 27 27 24 25   GLUCOSE 143* 138* 126* 124* 133* 139*  BUN 6 5* 3* 3* 4* 5*  CREATININE 0.53 0.47* 0.40* 0.38* 0.41* 0.39*  CALCIUM 8.2* 8.5 8.5 8.5 8.4 8.8  MG 2.1 1.7 1.8  --   --   --    Liver Function Tests:  Recent Labs Lab 09/01/12 0600 09/02/12 0500 09/03/12 0500 09/04/12 0455 09/07/12 1246  AST 154* 114* 93* 77* 77*  ALT 23 22 24 22 24   ALKPHOS 246* 225* 234* 218* 223*  BILITOT 6.7* 6.3* 6.8* 6.5* 6.9*  PROT 6.7 6.5 6.7 6.6 7.1  ALBUMIN 2.3* 2.2* 2.2* 2.0* 2.3*   No results found for this basename: LIPASE, AMYLASE,  in the last 168 hours  Recent Labs Lab 09/01/12 0600 09/02/12 0630 09/03/12 0500 09/04/12 0455 09/07/12 1246  AMMONIA 93* 65* 73* 82* 28   CBC:  Recent Labs Lab 09/01/12 0600 09/02/12 0500 09/03/12 0500  09/04/12 0455 09/05/12 0550  WBC 9.3 8.0 8.8 7.9 8.9  HGB 7.2* 6.7* 9.2* 8.6* 8.4*  HCT 21.3* 20.8* 28.0* 25.6* 25.4*  MCV 92.2 97.7 92.1 93.1 94.8  PLT 90* 74* 74* 79* 88*   Cardiac Enzymes: No results found for this basename: CKTOTAL, CKMB, CKMBINDEX, TROPONINI,  in the last 168 hours BNP: No components found with this basename: POCBNP,  CBG:  Recent Labs Lab 09/03/12 0744  GLUCAP 127*    Radiological Exams on Admission: No results found.  Time spent:  CHIU, STEPHEN K [facility] Pager [phone]  If 7PM-7AM, please contact night-coverage [email or web address] Password [email or web address] 09/07/2012, 3:13 PM

## 2012-09-07 NOTE — ED Notes (Addendum)
Pt presents today with swelling to abd, and legs as well as yellowing of sclera. Pt states she was just admitted on Wednesday for high ammonia levels and was d/c'd home. Pt states she has not been putting out much urine and is unable to have a bowel movement. Abdomen visibly distended.

## 2012-09-08 LAB — URINE CULTURE

## 2012-09-10 ENCOUNTER — Encounter (HOSPITAL_COMMUNITY)
Admission: EM | Disposition: A | Discharge status: Home / Self Care | Payer: Self-pay | Source: Home / Self Care | Attending: Pulmonary Disease

## 2012-09-10 ENCOUNTER — Encounter (HOSPITAL_COMMUNITY): Payer: Self-pay | Admitting: Gastroenterology

## 2012-09-10 ENCOUNTER — Inpatient Hospital Stay (HOSPITAL_COMMUNITY)
Admission: EM | Admit: 2012-09-10 | Discharge: 2012-09-14 | DRG: 377 | Disposition: A | Discharge status: Home / Self Care | Payer: Medicaid Other | Attending: Internal Medicine | Admitting: Internal Medicine

## 2012-09-10 DIAGNOSIS — F329 Major depressive disorder, single episode, unspecified: Secondary | ICD-10-CM

## 2012-09-10 DIAGNOSIS — K8309 Other cholangitis: Secondary | ICD-10-CM | POA: Diagnosis present

## 2012-09-10 DIAGNOSIS — K7030 Alcoholic cirrhosis of liver without ascites: Secondary | ICD-10-CM | POA: Diagnosis present

## 2012-09-10 DIAGNOSIS — R188 Other ascites: Secondary | ICD-10-CM | POA: Diagnosis present

## 2012-09-10 DIAGNOSIS — F17200 Nicotine dependence, unspecified, uncomplicated: Secondary | ICD-10-CM | POA: Diagnosis present

## 2012-09-10 DIAGNOSIS — E876 Hypokalemia: Secondary | ICD-10-CM | POA: Diagnosis present

## 2012-09-10 DIAGNOSIS — Z79899 Other long term (current) drug therapy: Secondary | ICD-10-CM

## 2012-09-10 DIAGNOSIS — K8301 Primary sclerosing cholangitis: Secondary | ICD-10-CM

## 2012-09-10 DIAGNOSIS — F1020 Alcohol dependence, uncomplicated: Secondary | ICD-10-CM | POA: Diagnosis present

## 2012-09-10 DIAGNOSIS — E871 Hypo-osmolality and hyponatremia: Secondary | ICD-10-CM | POA: Diagnosis present

## 2012-09-10 DIAGNOSIS — F411 Generalized anxiety disorder: Secondary | ICD-10-CM | POA: Diagnosis present

## 2012-09-10 DIAGNOSIS — E872 Acidosis, unspecified: Secondary | ICD-10-CM | POA: Diagnosis present

## 2012-09-10 DIAGNOSIS — R578 Other shock: Secondary | ICD-10-CM

## 2012-09-10 DIAGNOSIS — R7309 Other abnormal glucose: Secondary | ICD-10-CM | POA: Diagnosis present

## 2012-09-10 DIAGNOSIS — I8511 Secondary esophageal varices with bleeding: Secondary | ICD-10-CM | POA: Diagnosis present

## 2012-09-10 DIAGNOSIS — E7220 Disorder of urea cycle metabolism, unspecified: Secondary | ICD-10-CM

## 2012-09-10 DIAGNOSIS — D62 Acute posthemorrhagic anemia: Secondary | ICD-10-CM

## 2012-09-10 DIAGNOSIS — K766 Portal hypertension: Secondary | ICD-10-CM | POA: Diagnosis present

## 2012-09-10 DIAGNOSIS — K922 Gastrointestinal hemorrhage, unspecified: Principal | ICD-10-CM

## 2012-09-10 DIAGNOSIS — Z791 Long term (current) use of non-steroidal anti-inflammatories (NSAID): Secondary | ICD-10-CM

## 2012-09-10 DIAGNOSIS — K219 Gastro-esophageal reflux disease without esophagitis: Secondary | ICD-10-CM | POA: Diagnosis present

## 2012-09-10 DIAGNOSIS — F3289 Other specified depressive episodes: Secondary | ICD-10-CM | POA: Diagnosis present

## 2012-09-10 HISTORY — PX: ESOPHAGOGASTRODUODENOSCOPY: SHX5428

## 2012-09-10 LAB — COMPREHENSIVE METABOLIC PANEL
ALT: 29 U/L (ref 0–35)
Alkaline Phosphatase: 177 U/L — ABNORMAL HIGH (ref 39–117)
BUN: 12 mg/dL (ref 6–23)
Chloride: 83 mEq/L — ABNORMAL LOW (ref 96–112)
GFR calc Af Amer: >90 mL/min (ref >90)
Glucose, Bld: 168 mg/dL — ABNORMAL HIGH (ref 70–99)
Potassium: 3.9 mEq/L (ref 3.5–5.1)
Sodium: 124 mEq/L — ABNORMAL LOW (ref 135–145)
Total Bilirubin: 5.4 mg/dL — ABNORMAL HIGH (ref 0.3–1.2)
Total Protein: 6 g/dL (ref 6.0–8.3)

## 2012-09-10 LAB — CBC WITH DIFFERENTIAL/PLATELET
Basophils Relative: 0 % (ref 0–1)
Eosinophils Relative: 1 % (ref 0–5)
HCT: 18 % — ABNORMAL LOW (ref 36.0–46.0)
Hemoglobin: 6.3 g/dL — CL (ref 12.0–15.0)
Lymphocytes Relative: 7 % — ABNORMAL LOW (ref 12–46)
MCHC: 35 g/dL (ref 30.0–36.0)
Monocytes Relative: 11 % (ref 3–12)
Neutro Abs: 11.2 10*3/uL — ABNORMAL HIGH (ref 1.7–7.7)
Neutrophils Relative %: 81 % — ABNORMAL HIGH (ref 43–77)
RBC: 1.92 MIL/uL — ABNORMAL LOW (ref 3.87–5.11)
WBC: 13.8 10*3/uL — ABNORMAL HIGH (ref 4.0–10.5)

## 2012-09-10 LAB — POCT I-STAT, CHEM 8
BUN: 12 mg/dL (ref 6–23)
Calcium, Ion: 1.03 mmol/L — ABNORMAL LOW (ref 1.12–1.23)
Creatinine, Ser: 0.8 mg/dL (ref 0.50–1.10)
Hemoglobin: 6.8 g/dL — CL (ref 12.0–15.0)
TCO2: 29 mmol/L (ref 0–100)

## 2012-09-10 LAB — GLUCOSE, CAPILLARY: Glucose-Capillary: 131 mg/dL — ABNORMAL HIGH (ref 70–99)

## 2012-09-10 SURGERY — EGD (ESOPHAGOGASTRODUODENOSCOPY)
Anesthesia: Moderate Sedation

## 2012-09-10 MED ORDER — INSULIN ASPART 100 UNIT/ML ~~LOC~~ SOLN
0.0000 [IU] | SUBCUTANEOUS | Status: DC
  Administered 2012-09-11: 2 [IU] via SUBCUTANEOUS

## 2012-09-10 MED ORDER — PANTOPRAZOLE SODIUM 40 MG IV SOLR
40.0000 mg | Freq: Once | INTRAVENOUS | Status: AC
  Administered 2012-09-10: 40 mg via INTRAVENOUS
  Filled 2012-09-10: qty 40

## 2012-09-10 MED ORDER — DIPHENHYDRAMINE HCL 50 MG/ML IJ SOLN
INTRAMUSCULAR | Status: AC
  Filled 2012-09-10: qty 1

## 2012-09-10 MED ORDER — BUTAMBEN-TETRACAINE-BENZOCAINE 2-2-14 % EX AERO
INHALATION_SPRAY | CUTANEOUS | Status: DC | PRN
  Administered 2012-09-10: 2 via TOPICAL

## 2012-09-10 MED ORDER — DIPHENHYDRAMINE HCL 50 MG/ML IJ SOLN
INTRAMUSCULAR | Status: DC | PRN
  Administered 2012-09-10: 25 mg via INTRAVENOUS

## 2012-09-10 MED ORDER — SODIUM CHLORIDE 0.9 % IV SOLN
INTRAVENOUS | Status: DC
  Administered 2012-09-10 – 2012-09-14 (×5): via INTRAVENOUS

## 2012-09-10 MED ORDER — CEFTRIAXONE SODIUM 1 G IJ SOLR
1.0000 g | Freq: Once | INTRAMUSCULAR | Status: AC
  Administered 2012-09-10: 1 g via INTRAVENOUS
  Filled 2012-09-10: qty 10

## 2012-09-10 MED ORDER — FENTANYL CITRATE 0.05 MG/ML IJ SOLN
INTRAMUSCULAR | Status: AC
  Filled 2012-09-10: qty 4

## 2012-09-10 MED ORDER — THIAMINE HCL 100 MG/ML IJ SOLN
100.0000 mg | Freq: Every day | INTRAMUSCULAR | Status: DC
  Administered 2012-09-11: 100 mg via INTRAVENOUS
  Filled 2012-09-10 (×3): qty 1

## 2012-09-10 MED ORDER — OCTREOTIDE LOAD VIA INFUSION: 50.0000 ug | Freq: Once | INTRAVENOUS | Status: DC

## 2012-09-10 MED ORDER — FOLIC ACID 5 MG/ML IJ SOLN
1.0000 mg | Freq: Every day | INTRAMUSCULAR | Status: DC
  Administered 2012-09-11: 1 mg via INTRAVENOUS
  Filled 2012-09-10 (×4): qty 0.2

## 2012-09-10 MED ORDER — SODIUM CHLORIDE 0.9 % IV SOLN: INTRAVENOUS | Status: DC

## 2012-09-10 MED ORDER — MIDAZOLAM HCL 5 MG/ML IJ SOLN
INTRAMUSCULAR | Status: AC
  Filled 2012-09-10: qty 2

## 2012-09-10 MED ORDER — MIDAZOLAM HCL 10 MG/2ML IJ SOLN
INTRAMUSCULAR | Status: DC | PRN
  Administered 2012-09-10: 1 mg via INTRAVENOUS
  Administered 2012-09-10: 2 mg via INTRAVENOUS
  Administered 2012-09-10 (×2): 1 mg via INTRAVENOUS
  Administered 2012-09-10: 2 mg via INTRAVENOUS

## 2012-09-10 MED ORDER — CEFTRIAXONE SODIUM 1 G IJ SOLR
1.0000 g | INTRAMUSCULAR | Status: DC
  Administered 2012-09-11 – 2012-09-13 (×3): 1 g via INTRAVENOUS
  Filled 2012-09-10 (×5): qty 10

## 2012-09-10 MED ORDER — SODIUM CHLORIDE 0.9 % IV SOLN
80.0000 mg | Freq: Once | INTRAVENOUS | Status: AC
  Administered 2012-09-10: 80 mg via INTRAVENOUS
  Filled 2012-09-10: qty 80

## 2012-09-10 MED ORDER — SODIUM CHLORIDE 0.9 % IV SOLN
50.0000 ug/h | INTRAVENOUS | Status: DC
  Administered 2012-09-10 – 2012-09-14 (×9): 50 ug/h via INTRAVENOUS
  Filled 2012-09-10 (×16): qty 1

## 2012-09-10 MED ORDER — SODIUM CHLORIDE 0.9 % IV SOLN
8.0000 mg/h | INTRAVENOUS | Status: DC
  Filled 2012-09-10 (×2): qty 80

## 2012-09-10 NOTE — Consult Note (Signed)
Referring Provider: Dr. Ross Marcus [facility] EDP) Primary Care Physician:  Gabriel Cirri, DO Primary Gastroenterologist:  Dr. Bosie Clos  Reason for Consultation:  Hematemesis  HPI: [name] is a 42 y.o. female who was just discharged from the hospital 5 days ago for hepatic encephalopathy, is being admitted through the emergency room this evening because of several episodes of large-volume hematemesis over the past 24 hours.   The patient, who has cirrhosis on the basis of primary sclerosing cholangitis and alcohol, has been followed by my partner, Dr. Charlott Rakes, for several years. She has no prior history of GI bleeding.   At the time of her last endoscopy 6 years ago, in September 2008, no varices were noted.      The patient indicates her last alcohol intake was prior to her recent hospitalization.  She denies prodromal dyspeptic symptoms.  Yesterday evening, she felt the urge to have a bowel movement, which apparently was non-melenic in character. She then vomited up blood. She had further, larger volume hematemesis a couple of times today. On the more recent of those occasions, she was on the verge of passing out.  At this time, she has been without bleeding for the past several hours, and is comfortable, without chest pain.  The patient's hemoglobin is about 2 g below where she was when she left the hospital 5 days ago.     Past Medical History  Diagnosis Date  . Sclerosing cholangitis   . Panic attacks   . Anxiety   . Alcohol abuse   . Ascites   . Cirrhosis of liver   . EtOH dependence 08/31/2012  . Family history of anesthesia complication     " my son had problems "    Past Surgical History  Procedure Laterality Date  . None    . Ercp      Prior to Admission medications   Medication Sig Start Date End Date Taking? Authorizing Provider  FLUoxetine (PROZAC) 10 MG capsule Take 1 capsule (10 mg total) by mouth daily. 09/05/12  Yes Clanford Cyndie Mull, MD  folic  acid (FOLVITE) 1 MG tablet Take 1 tablet (1 mg total) by mouth daily. 09/05/12  Yes Clanford Cyndie Mull, MD  furosemide (LASIX) 20 MG tablet Take 2 tablets (40 mg total) by mouth daily. 09/07/12  Yes Jerald Kief, MD  ibuprofen (ADVIL,MOTRIN) 200 MG tablet Take 600 mg by mouth every 6 (six) hours as needed for pain.   Yes Historical Provider, MD  lactulose (CHRONULAC) 10 GM/15ML solution Take 45 mLs (30 g total) by mouth 3 (three) times daily. 09/05/12  Yes Clanford Cyndie Mull, MD  Multiple Vitamin (MULTIVITAMIN WITH MINERALS) TABS tablet Take 1 tablet by mouth daily. 09/05/12  Yes Clanford Cyndie Mull, MD  nadolol (CORGARD) 20 MG tablet Take 0.5 tablets (10 mg total) by mouth daily. 07/05/12  Yes Shanker Levora Dredge, MD  potassium chloride 20 MEQ/15ML (10%) solution Take 15 mLs (20 mEq total) by mouth 2 (two) times daily. 07/05/12  Yes Shanker Levora Dredge, MD  rifaximin (XIFAXAN) 550 MG TABS tablet Take 1 tablet (550 mg total) by mouth 2 (two) times daily. 09/05/12  Yes Clanford Cyndie Mull, MD  spironolactone (ALDACTONE) 100 MG tablet Take 1 tablet (100 mg total) by mouth daily. 07/06/12  Yes Shanker Levora Dredge, MD  thiamine 100 MG tablet Take 1 tablet (100 mg total) by mouth daily. 09/05/12  Yes Clanford Cyndie Mull, MD  traMADol (ULTRAM) 50 MG tablet Take 1 tablet (50  mg total) by mouth every 6 (six) hours as needed for pain. 09/07/12  Yes Jerald Kief, MD  ursodiol (ACTIGALL) 300 MG capsule Take 300 mg by mouth 2 (two) times daily.   Yes Historical Provider, MD  spironolactone (ALDACTONE) 100 MG tablet Take 2 tablets (200 mg total) by mouth daily. 09/08/12 09/09/12  Jerald Kief, MD    Current Facility-Administered Medications  Medication Dose Route Frequency Provider Last Rate Last Dose  . 0.9 %  sodium chloride infusion   Intravenous Continuous Lonia Farber, MD 100 mL/hr at 09/10/12 2209    . [START ON 09/11/2012] cefTRIAXone (ROCEPHIN) 1 g in dextrose 5 % 50 mL IVPB  1 g Intravenous Q24H Konstantin  Zubelevitskiy, MD      . folic acid injection 1 mg  1 mg Intravenous Daily Konstantin Zubelevitskiy, MD      . insulin aspart (novoLOG) injection 0-15 Units  0-15 Units Subcutaneous Q4H Konstantin Zubelevitskiy, MD      . octreotide (SANDOSTATIN) 2 mcg/mL in sodium chloride 0.9 % 250 mL infusion  50 mcg/hr Intravenous Continuous Lonia Farber, MD 25 mL/hr at 09/10/12 2209 50 mcg/hr at 09/10/12 2209  . pantoprazole (PROTONIX) 80 mg in sodium chloride 0.9 % 250 mL infusion  8 mg/hr Intravenous Continuous Konstantin Zubelevitskiy, MD      . thiamine (B-1) injection 100 mg  100 mg Intravenous Daily Lonia Farber, MD       Current Outpatient Prescriptions  Medication Sig Dispense Refill  . FLUoxetine (PROZAC) 10 MG capsule Take 1 capsule (10 mg total) by mouth daily.  30 capsule  0  . folic acid (FOLVITE) 1 MG tablet Take 1 tablet (1 mg total) by mouth daily.  30 tablet  0  . furosemide (LASIX) 20 MG tablet Take 2 tablets (40 mg total) by mouth daily.  30 tablet  0  . ibuprofen (ADVIL,MOTRIN) 200 MG tablet Take 600 mg by mouth every 6 (six) hours as needed for pain.      Marland Kitchen lactulose (CHRONULAC) 10 GM/15ML solution Take 45 mLs (30 g total) by mouth 3 (three) times daily.  240 mL  0  . Multiple Vitamin (MULTIVITAMIN WITH MINERALS) TABS tablet Take 1 tablet by mouth daily.  30 tablet  0  . nadolol (CORGARD) 20 MG tablet Take 0.5 tablets (10 mg total) by mouth daily.  30 tablet  0  . potassium chloride 20 MEQ/15ML (10%) solution Take 15 mLs (20 mEq total) by mouth 2 (two) times daily.  500 mL  0  . rifaximin (XIFAXAN) 550 MG TABS tablet Take 1 tablet (550 mg total) by mouth 2 (two) times daily.  60 tablet  0  . spironolactone (ALDACTONE) 100 MG tablet Take 1 tablet (100 mg total) by mouth daily.  30 tablet  0  . thiamine 100 MG tablet Take 1 tablet (100 mg total) by mouth daily.  30 tablet  0  . traMADol (ULTRAM) 50 MG tablet Take 1 tablet (50 mg total) by mouth every 6 (six) hours  as needed for pain.  15 tablet  0  . ursodiol (ACTIGALL) 300 MG capsule Take 300 mg by mouth 2 (two) times daily.      Marland Kitchen spironolactone (ALDACTONE) 100 MG tablet Take 2 tablets (200 mg total) by mouth daily.  2 tablet  0    Allergies as of 09/10/2012  . (No Known Allergies)    Family History  Problem Relation Age of Onset  . CAD Father  History   Social History  . Marital Status: Single    Spouse Name: N/A    Number of Children: N/A  . Years of Education: N/A   Occupational History  . Not on file.   Social History Main Topics  . Smoking status: Current Every Day Smoker -- 1.00 packs/day for 20 years    Types: Cigarettes  . Smokeless tobacco: Never Used  . Alcohol Use: Yes     Comment: Pt drinks vodka/liquor every weekend, denies daily drinking  . Drug Use: Yes    Special: Marijuana  . Sexual Activity: Yes    Birth Control/ Protection: IUD   Other Topics Concern  . Not on file   Social History Narrative  . No narrative on file    Review of Systems: see history of present illness. Negative for frank melena or prodromal dyspeptic symptomatology. Negative for current chest pain. Does admit to irregularity of bowel habit, using lactulose for treatment of encephalopathy.   Physical Exam: Vital signs in last 24 hours: Pulse Rate:  [106-124] 109 (09/08 2245) Resp:  [11-26] 26 (09/08 2245) BP: (75-107)/(33-73) 82/39 mmHg (09/08 2245) SpO2:  [96 %-100 %] 98 % (09/08 2245)   General:  The patient is fully alert, lying in bed, coherent and appropriate, no distress whatsoever. Vital signs as noted, pertinent for systolic pressure in the 80s, heart rate around 100  Head:  Normocephalic and atraumatic. Eyes:  Sclera clear,  prominent  icterus.   Conjunctiva pink. Mouth:   No ulcerations or lesions.  Oropharynx pink & moist. Lungs:  Clear throughout to auscultation.   No wheezes, crackles, or rhonchi. No evident respiratory distress. Heart:   Regular rate and rhythm; no  murmurs, clicks, rubs,  or gallops. Abdomen: Moderately distended, but flank tympany is present on the right side, raising the question of the absence of ascites. Liver edge readily palpable one or 2 fingerbreadths below the right costal margin. Spleen not palpable. Abdomen nontender.   Msk:   Symmetrical without gross deformities. Pulses:  Normal  Radial pulse noted. Extremities:   Without clubbing, cyanosis, or edema. Neurologic:  Alert and coherent;  grossly normal neurologically. no asterixis.  Skin: Spider angiomata present on upper trunk  Psych:   Alert and cooperative. Normal mood and affect.  Intake/Output from previous day:   Intake/Output this shift:    Lab Results:  Recent Labs  09/10/12 1948 09/10/12 2027  WBC 13.8*  --   HGB 6.3* 6.8*  HCT 18.0* 20.0*  PLT 259  --    BMET  Recent Labs  09/10/12 1948 09/10/12 2027  NA 124* 127*  K 3.9 3.5  CL 83* 83*  CO2 31  --   GLUCOSE 168* 170*  BUN 12 12  CREATININE 0.50 0.80  CALCIUM 8.2*  --    LFT  Recent Labs  09/10/12 1948  PROT 6.0  ALBUMIN 2.0*  AST 94*  ALT 29  ALKPHOS 177*  BILITOT 5.4*   PT/INR No results found for this basename: LABPROT, INR,  in the last 72 hours   Studies/Results: No results found.  Impression: 1. Recurrent hematemesis 2. Posthemorrhagic anemia 3. Chronic liver disease with cirrhosis, portal hypertension, and evidence of hepatic synthetic dysfunction (jaundice, encephalopathy) 4. History of ulcerative colitis and primary sclerosing cholangitis, currently clinically quiescent    Plan: Agree with empiric octreotide and antibiotics while awaiting endoscopic evaluation. We will proceed to endoscopy later this evening. Ashby Dawes, purpose, risks reviewed with the patient and she is  agreeable.    LOS: 0 days   Shaneese Tait V  09/10/2012, 10:59 PM

## 2012-09-10 NOTE — ED Notes (Signed)
Dr. Clent Ridges currently at bedside speaking with pt.

## 2012-09-10 NOTE — ED Notes (Signed)
Notified of blood in Blood bank.  Awaiting larger IV access by IV team.

## 2012-09-10 NOTE — ED Provider Notes (Signed)
CSN: [id number]     Arrival date & time 09/10/12  1925 History   First MD Initiated Contact with Patient 09/10/12 1930     Chief Complaint  Patient presents with  . Hematemesis   (Consider location/radiation/quality/duration/timing/severity/associated sxs/prior Treatment) HPI This is a 42 year old female with a history of cirrhosis who presents with hematemesis. The patient reports vomiting bright red blood since last night. EMS noted half liter of bright red emesis upon arrival. Patient states that she has had multiple episodes of emesis and headache she has vomited over a liter of blood. She's never had anything like this before. She denies any history of esophageal varices. Patient was given Zofran in route. Of note, she had a recent admission for altered mental status secondary to elevated ammonia. She's also been treated multiple times for abdominal distention. Patient denies any abdominal pain. Patient does endorse generalized weakness and dizziness. She denies any other symptoms including fever, chest pain, shortness of breath, abdominal pain, urinary symptoms, focal weakness or numbness. Past Medical History  Diagnosis Date  . Sclerosing cholangitis   . Panic attacks   . Anxiety   . Alcohol abuse   . Ascites   . Cirrhosis of liver   . EtOH dependence 08/31/2012  . Family history of anesthesia complication     " my son had problems "   Past Surgical History  Procedure Laterality Date  . None    . Ercp     Family History  Problem Relation Age of Onset  . CAD Father    History  Substance Use Topics  . Smoking status: Current Every Day Smoker -- 1.00 packs/day for 20 years    Types: Cigarettes  . Smokeless tobacco: Never Used  . Alcohol Use: Yes     Comment: Pt drinks vodka/liquor every weekend, denies daily drinking   OB History   Grav Para Term Preterm Abortions TAB SAB Ect Mult Living                 Review of Systems  Constitutional: Negative for fever and chills.   Respiratory: Negative for cough, chest tightness and shortness of breath.   Cardiovascular: Negative for chest pain.  Gastrointestinal: Negative for nausea, vomiting, abdominal pain, diarrhea, abdominal distention and rectal pain.       Denies melena, positive for hematemesis  Genitourinary: Negative for dysuria.  Musculoskeletal: Negative for back pain.  Skin: Negative for wound.       Jaundiced  Neurological: Negative for headaches.  Psychiatric/Behavioral: Negative for confusion.  All other systems reviewed and are negative.    Allergies  Review of patient's allergies indicates no known allergies.  Home Medications   Current Outpatient Rx  Name  Route  Sig  Dispense  Refill  . FLUoxetine (PROZAC) 10 MG capsule   Oral   Take 1 capsule (10 mg total) by mouth daily.   30 capsule   0   . folic acid (FOLVITE) 1 MG tablet   Oral   Take 1 tablet (1 mg total) by mouth daily.   30 tablet   0   . furosemide (LASIX) 20 MG tablet   Oral   Take 2 tablets (40 mg total) by mouth daily.   30 tablet   0   . ibuprofen (ADVIL,MOTRIN) 200 MG tablet   Oral   Take 600 mg by mouth every 6 (six) hours as needed for pain.         Marland Kitchen lactulose (CHRONULAC) 10 GM/15ML solution  Oral   Take 45 mLs (30 g total) by mouth 3 (three) times daily.   240 mL   0   . Multiple Vitamin (MULTIVITAMIN WITH MINERALS) TABS tablet   Oral   Take 1 tablet by mouth daily.   30 tablet   0   . nadolol (CORGARD) 20 MG tablet   Oral   Take 0.5 tablets (10 mg total) by mouth daily.   30 tablet   0   . potassium chloride 20 MEQ/15ML (10%) solution   Oral   Take 15 mLs (20 mEq total) by mouth 2 (two) times daily.   500 mL   0   . rifaximin (XIFAXAN) 550 MG TABS tablet   Oral   Take 1 tablet (550 mg total) by mouth 2 (two) times daily.   60 tablet   0   . spironolactone (ALDACTONE) 100 MG tablet   Oral   Take 1 tablet (100 mg total) by mouth daily.   30 tablet   0   . thiamine 100 MG  tablet   Oral   Take 1 tablet (100 mg total) by mouth daily.   30 tablet   0   . traMADol (ULTRAM) 50 MG tablet   Oral   Take 1 tablet (50 mg total) by mouth every 6 (six) hours as needed for pain.   15 tablet   0   . ursodiol (ACTIGALL) 300 MG capsule   Oral   Take 300 mg by mouth 2 (two) times daily.         Marland Kitchen EXPIRED: spironolactone (ALDACTONE) 100 MG tablet   Oral   Take 2 tablets (200 mg total) by mouth daily.   2 tablet   0    BP 86/41  Pulse 112  Resp 14  SpO2 100% Physical Exam  Nursing note and vitals reviewed. Constitutional: She is oriented to person, place, and time.  Chronically ill-appearing, jaundiced  HENT:  Head: Normocephalic and atraumatic.  Eyes: Pupils are equal, round, and reactive to light.  Scleral icterus noted  Neck: Neck supple.  Cardiovascular: Regular rhythm and normal heart sounds.   Tachycardia  Pulmonary/Chest: Effort normal and breath sounds normal. No respiratory distress. She has no wheezes.  Abdominal: Soft. Bowel sounds are normal. She exhibits distension. There is no tenderness. There is no rebound and no guarding.  Musculoskeletal: She exhibits edema.  Neurological: She is alert and oriented to person, place, and time.  Skin: Skin is warm and dry.   jaundice  Psychiatric: She has a normal mood and affect.    ED Course  Procedures (including critical care time) Labs Review Labs Reviewed  CBC WITH DIFFERENTIAL - Abnormal; Notable for the following:    WBC 13.8 (*)    RBC 1.92 (*)    Hemoglobin 6.3 (*)    HCT 18.0 (*)    RDW 24.3 (*)    Neutrophils Relative % 81 (*)    Lymphocytes Relative 7 (*)    Neutro Abs 11.2 (*)    Monocytes Absolute 1.5 (*)    All other components within normal limits  COMPREHENSIVE METABOLIC PANEL - Abnormal; Notable for the following:    Sodium 124 (*)    Chloride 83 (*)    Glucose, Bld 168 (*)    Calcium 8.2 (*)    Albumin 2.0 (*)    AST 94 (*)    Alkaline Phosphatase 177 (*)     Total Bilirubin 5.4 (*)    All other  components within normal limits  LACTIC ACID, PLASMA - Abnormal; Notable for the following:    Lactic Acid, Venous 6.1 (*)    All other components within normal limits  POCT I-STAT, CHEM 8 - Abnormal; Notable for the following:    Sodium 127 (*)    Chloride 83 (*)    Glucose, Bld 170 (*)    Calcium, Ion 1.03 (*)    Hemoglobin 6.8 (*)    HCT 20.0 (*)    All other components within normal limits  LACTIC ACID, PLASMA  LACTIC ACID, PLASMA  LACTIC ACID, PLASMA  MAGNESIUM  PHOSPHORUS  APTT  PROTIME-INR  CBC  CBC  CBC  TYPE AND SCREEN  PREPARE RBC (CROSSMATCH)   Imaging Review No results found.  CRITICAL CARE Performed by: Ross Marcus, F   Total critical care time: 30 min  Critical care time was exclusive of separately billable procedures and treating other patients.  Critical care was necessary to treat or prevent imminent or life-threatening deterioration.  Critical care was time spent personally by me on the following activities: development of treatment plan with patient and/or surrogate as well as nursing, discussions with consultants, evaluation of patient's response to treatment, examination of patient, obtaining history from patient or surrogate, ordering and performing treatments and interventions, ordering and review of laboratory studies, ordering and review of radiographic studies, pulse oximetry and re-evaluation of patient's condition.  MDM   1. Acute upper GI hemorrhage   2. Acute blood loss anemia   3. Hemorrhagic shock    This is a 42 year old female with a history of cirrhosis who presents with hematemesis. She is nontoxic-appearing on exam vital signs are notable for a blood pressure 86/41 and a heart rate of 112. Patient is mentating. Abdomen is distended but soft. Patient has not had any hematemesis in the ER. Patient lab work was obtained including a chem 8. Patient has had a drop in her hemoglobin of 2 points the  last several days. Patient was typed and screened. I discussed the patient with the gastroenterologist who is like the patient admitted to the ICU for endoscopy. Patient was given octreotide, Rocephin, and IV PPI. Patient was discussed with the critical care physician who will admit the patient to the ICU for further management.    Shon Baton, MD 09/10/12 2210

## 2012-09-10 NOTE — ED Notes (Signed)
Report given to Steve, RN

## 2012-09-10 NOTE — ED Notes (Signed)
Critical hemoglobin results reported to Dr.Horton

## 2012-09-10 NOTE — H&P (Signed)
PULMONARY  / CRITICAL CARE MEDICINE  Name: [name] MRN: [id number] DOB: 01/19/1970    ADMISSION DATE:  09/10/2012 CONSULTATION DATE:  09/10/2012  REFERRING MD :  EDP PRIMARY SERVICE:  PCCM  CHIEF COMPLAINT:  Upper GI hemorrhage  BRIEF PATIENT DESCRIPTION: 42 yo with past medical history of Sclerosing Cholangeitis,  ETOH abuse and chronic NSAID use brought to ED with several episodes of large volume hematemesis since 1 day ago. Denies melena, hematochezia or abdominal pain.  Reports weakness / dizziness. In ED somewhat hypotensive, with elevated lactate.  PCCM was consulted.  SIGNIFICANT EVENTS / STUDIES:   LINES / TUBES: NGT 9/8 >>>  CULTURES:  ANTIBIOTICS: Ceftriaxone 9/8 >>>  The patient is encephalopathic and unable to provide history, which was obtained for available medical records.  HISTORY OF PRESENT ILLNESS:  42 yo with past medical history of Sclerosing Cholangeitis,  ETOH abuse and chronic NSAID use brought to ED with several episodes of large volume hematemesis since 1 day ago. Denies melena, hematochezia or abdominal pain.  Reports weakness / dizziness. In ED somewhat hypotensive, with elevated lactate.  PCCM was consulted.  PAST MEDICAL HISTORY :  Past Medical History  Diagnosis Date  . Sclerosing cholangitis   . Panic attacks   . Anxiety   . Alcohol abuse   . Ascites   . Cirrhosis of liver   . EtOH dependence 08/31/2012  . Family history of anesthesia complication     " my son had problems "   Past Surgical History  Procedure Laterality Date  . None    . Ercp     Prior to Admission medications   Medication Sig Start Date End Date Taking? Authorizing Provider  FLUoxetine (PROZAC) 10 MG capsule Take 1 capsule (10 mg total) by mouth daily. 09/05/12  Yes Clanford Cyndie Mull, MD  folic acid (FOLVITE) 1 MG tablet Take 1 tablet (1 mg total) by mouth daily. 09/05/12  Yes Clanford Cyndie Mull, MD  furosemide (LASIX) 20 MG tablet Take 2 tablets (40 mg total) by mouth  daily. 09/07/12  Yes Jerald Kief, MD  ibuprofen (ADVIL,MOTRIN) 200 MG tablet Take 600 mg by mouth every 6 (six) hours as needed for pain.   Yes Historical Provider, MD  lactulose (CHRONULAC) 10 GM/15ML solution Take 45 mLs (30 g total) by mouth 3 (three) times daily. 09/05/12  Yes Clanford Cyndie Mull, MD  Multiple Vitamin (MULTIVITAMIN WITH MINERALS) TABS tablet Take 1 tablet by mouth daily. 09/05/12  Yes Clanford Cyndie Mull, MD  nadolol (CORGARD) 20 MG tablet Take 0.5 tablets (10 mg total) by mouth daily. 07/05/12  Yes Shanker Levora Dredge, MD  potassium chloride 20 MEQ/15ML (10%) solution Take 15 mLs (20 mEq total) by mouth 2 (two) times daily. 07/05/12  Yes Shanker Levora Dredge, MD  rifaximin (XIFAXAN) 550 MG TABS tablet Take 1 tablet (550 mg total) by mouth 2 (two) times daily. 09/05/12  Yes Clanford Cyndie Mull, MD  spironolactone (ALDACTONE) 100 MG tablet Take 1 tablet (100 mg total) by mouth daily. 07/06/12  Yes Shanker Levora Dredge, MD  thiamine 100 MG tablet Take 1 tablet (100 mg total) by mouth daily. 09/05/12  Yes Clanford Cyndie Mull, MD  traMADol (ULTRAM) 50 MG tablet Take 1 tablet (50 mg total) by mouth every 6 (six) hours as needed for pain. 09/07/12  Yes Jerald Kief, MD  ursodiol (ACTIGALL) 300 MG capsule Take 300 mg by mouth 2 (two) times daily.   Yes Historical Provider, MD  spironolactone (  ALDACTONE) 100 MG tablet Take 2 tablets (200 mg total) by mouth daily. 09/08/12 09/09/12  Jerald Kief, MD   No Known Allergies  FAMILY HISTORY:  Family History  Problem Relation Age of Onset  . CAD Father    SOCIAL HISTORY:  reports that she has been smoking Cigarettes.  She has a 20 pack-year smoking history. She has never used smokeless tobacco. She reports that  drinks alcohol. She reports that she uses illicit drugs (Marijuana).  REVIEW OF SYSTEMS:  As in HPI, otherwise negative.  INTERVAL HISTORY:  VITAL SIGNS: Pulse Rate:  [110-112] 112 (09/08 2100) Resp:  [14-16] 14 (09/08 2100) BP: (86-107)/(33-73)  86/41 mmHg (09/08 2100) SpO2:  [96 %-100 %] 100 % (09/08 2100)HEMODYNAMICS:   VENTILATOR SETTINGS:   INTAKE / OUTPUT: Intake/Output   None    PHYSICAL EXAMINATION: General:  Appears acutely ill Neuro:  Awake, alert, cooperative HEENT:  PERRL, dry membranes, scleral icterus Cardiovascular:  Tachycardic, regular Lungs:  Bilateral diminished air entry, no w/r/r Abdomen:  Soft, nontender, bowel sounds diminished Musculoskeletal:  Moves all extremities, no edema Skin:  Intact  LABS:  Recent Labs Lab 09/04/12 0455 09/05/12 0550 09/07/12 1246 09/07/12 1350 09/10/12 1948 09/10/12 1950 09/10/12 2027  HGB 8.6* 8.4*  --   --  6.3*  --  6.8*  WBC 7.9 8.9  --   --  13.8*  --   --   PLT 79* 88*  --   --  259  --   --   NA 129* 133* 129*  --  124*  --  127*  K 3.5 4.3 3.9  --  3.9  --  3.5  CL 95* 101 95*  --  83*  --  83*  CO2 27 24 25   --  31  --   --   GLUCOSE 124* 133* 139*  --  168*  --  170*  BUN 3* 4* 5*  --  12  --  12  CREATININE 0.38* 0.41* 0.39*  --  0.50  --  0.80  CALCIUM 8.5 8.4 8.8  --  8.2*  --   --   AST 77*  --  77*  --  94*  --   --   ALT 22  --  24  --  29  --   --   ALKPHOS 218*  --  223*  --  177*  --   --   BILITOT 6.5*  --  6.9*  --  5.4*  --   --   PROT 6.6  --  7.1  --  6.0  --   --   ALBUMIN 2.0*  --  2.3*  --  2.0*  --   --   LATICACIDVEN  --   --   --  1.5  --  6.1*  --    No results found for this basename: GLUCAP,  in the last 168 hours  CXR:    ASSESSMENT / PLAN:  PULMONARY A:  No active issues. Protects airway. P:   Gaol SpO2>92 Supplemental oxygen PRN  CARDIOVASCULAR A: Hemorrhagic shock. Lactic acidosis likely exacerbated by impaired liver function. P:  Goal MAP>60 May need CVL / vasopressors Two large bore IVs for now Trend lactate Hold Nadolol  RENAL A:  Hyponatremia.  Mild hypokalemia. Normal renal function. P:   Trend BMP Mg, Phos NS@75  Hold Lasix / Aldactone  GASTROINTESTINAL A:  Upper GI hemorrhage (variceal vs  ulcer). Sclerosing cholangeitis.  Liver cirrhosis.  P:   NPO as intubated Protonix bolus and gtt Octreotide bolus and gtt GI contacted by EDP, EGD in AM NGT Lactulose / Rifampin / Ursodiol when able  HEMATOLOGIC A:  Acute blood loss anemia in setting of acute upper GI hemorrhage. P:  Goal Hb > 10 in setting of active hemorrhage Trend CBC q6h SCDs for DVT Px APTT / INR  INFECTIOUS A:  No evidence of active infection. P:   Ceftriaxone for SBP Px as above  ENDOCRINE  A:  Hyperglycemia. P:   SSI  NEUROLOGIC A:  History of anxiety / depression. ETOH abuse. P:   Prozac when able CIWA / Thiamine / Folate  I have personally obtained a history, examined the patient, evaluated laboratory and imaging results, formulated the assessment and plan and placed orders.  CRITICAL CARE:  The patient is critically ill with multiple organ systems failure and requires high complexity decision making for assessment and support, frequent evaluation and titration of therapies, application of advanced monitoring technologies and extensive interpretation of multiple databases. Critical Care Time devoted to patient care services described in this note is 45 minutes.   Lonia Farber, MD Pulmonary and Critical Care Medicine [facility] Pager: [phone]  09/10/2012, 9:27 PM

## 2012-09-10 NOTE — ED Notes (Addendum)
IV team unable to obtain IV access with Ultrasound.  Dr. Herma Carson notified.

## 2012-09-10 NOTE — ED Notes (Signed)
Patient Began vomiting blood last PM. hematemesis began last PM. Patient thought it was a one time event.  EMS called today when hematemesis continued. 4 mg zofran SL with relief of nausea. Patient has been treated for acites here in the recent past.

## 2012-09-10 NOTE — ED Notes (Signed)
IV team paged GI request for larger access.

## 2012-09-11 ENCOUNTER — Encounter (HOSPITAL_COMMUNITY): Payer: Self-pay | Admitting: Gastroenterology

## 2012-09-11 DIAGNOSIS — K8309 Other cholangitis: Secondary | ICD-10-CM

## 2012-09-11 LAB — CBC
HCT: 20.7 % — ABNORMAL LOW (ref 36.0–46.0)
HCT: 25 % — ABNORMAL LOW (ref 36.0–46.0)
Hemoglobin: 5 g/dL — CL (ref 12.0–15.0)
Hemoglobin: 9 g/dL — ABNORMAL LOW (ref 12.0–15.0)
Hemoglobin: 8.7 g/dL — ABNORMAL LOW (ref 12.0–15.0)
MCH: 32.3 pg (ref 26.0–34.0)
MCH: 31.8 pg (ref 26.0–34.0)
MCHC: 34.8 g/dL (ref 30.0–36.0)
MCHC: 34.8 g/dL (ref 30.0–36.0)
MCV: 91.2 fL (ref 78.0–100.0)
MCV: 90.5 fL (ref 78.0–100.0)
MCV: 91.2 fL (ref 78.0–100.0)
Platelets: 168 10*3/uL (ref 150–400)
RBC: 1.55 MIL/uL — ABNORMAL LOW (ref 3.87–5.11)
RBC: 2.83 MIL/uL — ABNORMAL LOW (ref 3.87–5.11)
RDW: 20.5 % — ABNORMAL HIGH (ref 11.5–15.5)
RDW: 20.6 % — ABNORMAL HIGH (ref 11.5–15.5)
WBC: 7.5 10*3/uL (ref 4.0–10.5)
WBC: 9 10*3/uL (ref 4.0–10.5)
WBC: 9 10*3/uL (ref 4.0–10.5)

## 2012-09-11 LAB — GLUCOSE, CAPILLARY: Glucose-Capillary: 108 mg/dL — ABNORMAL HIGH (ref 70–99)

## 2012-09-11 LAB — PROTIME-INR
INR: 1.71 — ABNORMAL HIGH (ref 0.00–1.49)
Prothrombin Time: 19.6 seconds — ABNORMAL HIGH (ref 11.6–15.2)

## 2012-09-11 LAB — APTT: aPTT: 35 seconds (ref 24–37)

## 2012-09-11 LAB — MRSA PCR SCREENING: MRSA by PCR: NEGATIVE

## 2012-09-11 MED ORDER — PHENOL 1.4 % MT LIQD
1.0000 | OROMUCOSAL | Status: DC | PRN
  Administered 2012-09-11: 1 via OROMUCOSAL
  Filled 2012-09-11: qty 177

## 2012-09-11 MED ORDER — LACTULOSE 10 GM/15ML PO SOLN
20.0000 g | Freq: Every day | ORAL | Status: DC
  Administered 2012-09-11 – 2012-09-12 (×2): 20 g via ORAL
  Filled 2012-09-11 (×3): qty 30

## 2012-09-11 MED ORDER — PANTOPRAZOLE SODIUM 40 MG IV SOLR
40.0000 mg | Freq: Two times a day (BID) | INTRAVENOUS | Status: DC
  Administered 2012-09-12 (×2): 40 mg via INTRAVENOUS
  Filled 2012-09-11 (×4): qty 40

## 2012-09-11 MED ORDER — MORPHINE SULFATE 2 MG/ML IJ SOLN
1.0000 mg | INTRAMUSCULAR | Status: DC | PRN
  Administered 2012-09-11 – 2012-09-13 (×6): 2 mg via INTRAVENOUS
  Filled 2012-09-11 (×6): qty 1

## 2012-09-11 MED ORDER — VITAMIN K1 10 MG/ML IJ SOLN
10.0000 mg | Freq: Once | INTRAVENOUS | Status: AC
  Administered 2012-09-11: 10 mg via INTRAVENOUS
  Filled 2012-09-11: qty 1

## 2012-09-11 NOTE — Progress Notes (Signed)
Patient's endoscopy was well tolerated.  She has moderate esophageal varices, one of which had a red color sign, indicating a probable stigma of recent hemorrhage. Banding was performed.  Although the patient also had some superficial ulcerations in the antrum of the stomach (consistent with her recent exposure to nonsteroidal anti-inflammatory drugs), it is not felt that these are likely related to her recent bleeding.  Recommendations:  1. Continue supportive care 2. Continue Protonix for several weeks. Probably okay to transitioned to oral Protonix tomorrow if she remains stable. 3. Continue octreotide for at least another one to 3 days. 4. Consider repeat endoscopy in several weeks to perform further variceal banding, with the intent of obliteration of varices

## 2012-09-11 NOTE — Progress Notes (Signed)
Bladder scan done = , and 727 mL.  Pt up to bedside commode with assist and supervision because pt states she is unable to use bedpan.  Unable to void but has a liquid dark red stool approx .  Returned to bed.  [facility] called for orders.  L Yanni Quiroa RN

## 2012-09-11 NOTE — Significant Event (Signed)
Pt c/o sore throat and back pain.  Order chloraseptic spray and prn morphine IV.  Coralyn Helling, MD 09/11/2012, 8:37 PM

## 2012-09-11 NOTE — Progress Notes (Signed)
PULMONARY  / CRITICAL CARE MEDICINE  Name: [name] MRN: [id number] DOB: 01/07/1970    ADMISSION DATE:  09/10/2012 CONSULTATION DATE:  09/10/2012  REFERRING MD :  EDP PRIMARY SERVICE:  PCCM  CHIEF COMPLAINT:  Upper GI hemorrhage  BRIEF PATIENT DESCRIPTION: 42 yo with past medical history of Sclerosing Cholangeitis, cirrhosis,  ETOH abuse and chronic NSAID use brought to ED with several episodes of large volume hematemesis since 1 day ago. Denies melena, hematochezia or abdominal pain.  Reports weakness / dizziness. In ED somewhat hypotensive, with elevated lactate.  PCCM was consulted.  SIGNIFICANT EVENTS / STUDIES:   LINES / TUBES: NGT 9/8 >>>  CULTURES:  ANTIBIOTICS: Ceftriaxone 9/8 >>>   INTERVAL HISTORY: denies abdominal pain Afebrile Sleepy this am but responds appropriate to questions  VITAL SIGNS: Temp:  [98.2 F (36.8 C)-98.8 F (37.1 C)] 98.2 F (36.8 C) (09/09 0752) Pulse Rate:  [92-124] 92 (09/09 0715) Resp:  [11-30] 14 (09/09 0715) BP: (75-107)/(27-86) 87/37 mmHg (09/09 0715) SpO2:  [94 %-100 %] 97 % (09/09 0715) Weight:  [62.9 kg (138 lb 10.7 oz)] 62.9 kg (138 lb 10.7 oz) (09/09 0000)HEMODYNAMICS:   VENTILATOR SETTINGS:   INTAKE / OUTPUT: Intake/Output     09/08 0701 - 09/09 0700 09/09 0701 - 09/10 0700   I.V. (mL/kg) 1381.3 (22)    Blood 577.1    Total Intake(mL/kg) 1958.3 (31.1)    Urine (mL/kg/hr)  350 (4.8)   Total Output   350   Net +1958.3 -350         PHYSICAL EXAMINATION: General:  Appears acutely ill Neuro:  Awake, alert, cooperative HEENT:  PERRL, dry membranes, scleral icterus Cardiovascular:  Tachycardic, regular Lungs:  Bilateral diminished air entry, no w/r/r Abdomen:  Soft, nontender, bowel sounds diminished Musculoskeletal:  Moves all extremities, no edema Skin:  Intact  LABS:  Recent Labs Lab 09/05/12 0550 09/07/12 1246  09/10/12 1948 09/10/12 1950 09/10/12 2027 09/10/12 2228 09/11/12 0035 09/11/12 0544  HGB 8.4*   --   --  6.3*  --  6.8*  --  5.0* 7.2*  WBC 8.9  --   --  13.8*  --   --   --  7.5 8.0  PLT 88*  --   --  259  --   --   --  168 151  NA 133* 129*  --  124*  --  127*  --   --   --   K 4.3 3.9  --  3.9  --  3.5  --   --   --   CL 101 95*  --  83*  --  83*  --   --   --   CO2 24 25  --  31  --   --   --   --   --   GLUCOSE 133* 139*  --  168*  --  170*  --   --   --   BUN 4* 5*  --  12  --  12  --   --   --   CREATININE 0.41* 0.39*  --  0.50  --  0.80  --   --   --   CALCIUM 8.4 8.8  --  8.2*  --   --   --   --   --   MG  --   --   --   --   --   --   --  1.5  --  PHOS  --   --   --   --   --   --   --  3.0  --   AST  --  77*  --  94*  --   --   --   --   --   ALT  --  24  --  29  --   --   --   --   --   ALKPHOS  --  223*  --  177*  --   --   --   --   --   BILITOT  --  6.9*  --  5.4*  --   --   --   --   --   PROT  --  7.1  --  6.0  --   --   --   --   --   ALBUMIN  --  2.3*  --  2.0*  --   --   --   --   --   APTT  --   --   --   --   --   --   --  35  --   INR  --   --   --   --   --   --   --  1.71*  --   LATICACIDVEN  --   --   < >  --  6.1*  --  5.0*  --  1.2  < > = values in this interval not displayed.  Recent Labs Lab 09/10/12 2320 09/11/12 0529 09/11/12 0750  GLUCAP 131* 106* 112*     ASSESSMENT / PLAN:  PULMONARY A:  No active issues. Protects airway. P:   Gaol SpO2>92 Supplemental oxygen PRN  CARDIOVASCULAR A: Hemorrhagic shock. Lactic acidosis likely exacerbated by impaired liver function. P:  Goal SBP>90, lactate resolution good sign Hold Nadolol  RENAL A:  Hyponatremia.  Mild hypokalemia. Normal renal function. P:   NS@75  Hold Lasix / Aldactone  GASTROINTESTINAL A:  Upper GI hemorrhage EGD  - moderate esophageal varices, with stigma of recent hemorrhage s/p banding  Sclerosing cholangeitis.  Liver cirrhosis.  P:   NPO  Protonix bolus and gtt Octreotide bolus and gtt Lactulose / Rifampin / Ursodiol when able Rpt banding in a few  weeks  HEMATOLOGIC A:  Acute blood loss anemia in setting of acute upper GI hemorrhage. P:  Goal Hb > 8 in setting of active hemorrhage Trend CBC q6h SCDs for DVT Px Vit K x1 - rpt INR in am  INFECTIOUS A:  No evidence of active infection. P:   Ceftriaxone for SBP Px as above  ENDOCRINE  A:  Hyperglycemia. P:   SSI  NEUROLOGIC A:  History of anxiety / depression. ETOH abuse. P:   Prozac when able CIWA / Thiamine / Folate  I have personally obtained a history, examined the patient, evaluated laboratory and imaging results, formulated the assessment and plan and placed orders.  CRITICAL CARE:  The patient is critically ill with multiple organ systems failure and requires high complexity decision making for assessment and support, frequent evaluation and titration of therapies, application of advanced monitoring technologies and extensive interpretation of multiple databases. Critical Care Time devoted to patient care services described in this note is 35 minutes.   Oretha Milch., MD Pulmonary and Critical Care Medicine [facility] Pager: [phone]  09/11/2012, 8:10 AM

## 2012-09-11 NOTE — Op Note (Signed)
[facility] [address] [place], [address]   ENDOSCOPY PROCEDURE REPORT  PATIENT: [name], King  MR#: [id number] BIRTHDATE: [birth year] , 41  yrs. old GENDER: Female ENDOSCOPIST:Shana Zavaleta, MD REFERRED BY:  [facility] PROCEDURE DATE:  09/10/2012 PROCEDURE:  upper endoscopy with variceal banding ASA CLASS: INDICATIONS:   hematemesis in a patient with cirrhosis and chronic liver disease and a one-day history of recurrent hematemesis. No prior history of GI bleeding. MEDICATION:    Versed 7 mg, Benadryl 25 mg IV TOPICAL ANESTHETIC:  DESCRIPTION OF PROCEDURE:   the procedure was performed at the bedside in the ICU. The patient had been brought from the emergency room to the ICU just prior to this procedure. The procedure had been explained to the patient, including risks, and she provided written consent. Time it was performed. The above sedation was administered prior to and during the course of the procedure, without clinical instability.  The Pentax adult video endoscope was passed under direct vision. The vocal cords were not well seen. The esophagus was entered without significant difficulty.  The esophagus had small to moderate-sized varices in the distal half of the esophagus. One of these had a prominent red color sign protruding from it, but there was no active bleeding from the esophagus. No reflux esophagitis, masses, infection, or strictures were noted. There was no significant hiatal hernia.  The stomach was entered. There was a moderate amount of fresh red blood occupying much of the fundus of the stomach. In the antrum of the stomach were several superficial ulcerations, one of which had a slightly dirty base. None of these had a protuberant vessel or adherent clot, and it did not appear that any of them would be of sufficient magnitude to have led to the amount of blood loss this patient has experienced. No polyps or masses were  observed in the stomach.  A retroflexed view of the cardia showed no evidence of gastric varices. A moderate amount of the fundus remained obscured by the presence of overlying blood and clot.  The pylorus, duodenal bulb, and second duodenum looked normal.  I elected to band the patient's varices. 4 or 5 bands were successfully deployed. One of these was directly circumferentially over the red color sign with protuberant vessel. Some active bleeding was incited by this procedure, from a varix adjacent to the one with the red color sign. This bleeding seemed to stop immediately upon application of another band.there was no evident bleeding at the conclusion of the procedure. The patient tolerated the procedure, overall, quite well.     COMPLICATIONS: None  ENDOSCOPIC IMPRESSION:  1. No active bleeding at beginning of procedure. 2. Moderately large amount of fresh blood in the proximal stomach, consistent with recent bleeding. 3. Moderate esophageal varices, one of which had a stigmata of hemorrhage. Banding performed x 4. 4. Antral superficial ulcerations, probably not related to the patient's bleeding. She has been on nonsteroidal anti-inflammatory drugs and small amounts, recently.  RECOMMENDATIONS:  1. Continue octreotide for another one to 3 days 2. Continue IV Protonix for now, then switch to oral 3. Probably okay to advance diet tomorrow, at least to clear liquids    _______________________________ eSigned:  Bernette Redbird, MD 09/11/2012 12:29 AM    PATIENT NAME:  [name], King MR#: [id number]

## 2012-09-11 NOTE — Progress Notes (Signed)
[facility] PROGRESS NOTE Subjective Pt s/p banding of esophageal varices last night by Dr Matthias Hughs, Hg stable after transfusion no gross bleeding  Objective: Vital signs in last 24 hours: Temp:  [98.2 F (36.8 C)-98.9 F (37.2 C)] 98.9 F (37.2 C) (09/09 0900) Pulse Rate:  [89-124] 90 (09/09 1030) Resp:  [11-30] 13 (09/09 1030) BP: (75-107)/(27-86) 90/44 mmHg (09/09 1030) SpO2:  [94 %-100 %] 97 % (09/09 1030) Weight:  [62.9 kg (138 lb 10.7 oz)] 62.9 kg (138 lb 10.7 oz) (09/09 0000)    Intake/Output from previous day: 09/08 0701 - 09/09 0700 In: 1958.3 [I.V.:1381.3; Blood:577.1] Out: -  Intake/Output this shift: Total I/O In: 325 [I.V.:275; IV Piggyback:50] Out: 410 [Urine:410]  PE: General--somnulent but arousable and oriented Heart-- Lungs--clear Abdomen--soft nontender  Lab Results:  Recent Labs  09/10/12 1948 09/10/12 2027 09/11/12 0035 09/11/12 0544  WBC 13.8*  --  7.5 8.0  HGB 6.3* 6.8* 5.0* 7.2*  HCT 18.0* 20.0* 14.6* 20.7*  PLT 259  --  168 151   BMET  Recent Labs  09/10/12 1948 09/10/12 2027  NA 124* 127*  K 3.9 3.5  CL 83* 83*  CO2 31  --   CREATININE 0.50 0.80   LFT  Recent Labs  09/10/12 1948  PROT 6.0  AST 94*  ALT 29  ALKPHOS 177*  BILITOT 5.4*   PT/INR  Recent Labs  09/11/12 0035  LABPROT 19.6*  INR 1.71*   PANCREAS No results found for this basename: LIPASE,  in the last 72 hours       Studies/Results: No results found.  Medications: I have reviewed the patient's current medications.  Assessment/Plan: 1. Variceal Bleed. 2. 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 Scheduled to be seen at [facility] for listing for transplant  Plan: Continue the octreotide and will allow ice chips. Continue the AB and PPI, lactulose [name],[name] 09/11/2012, 11:17 AM

## 2012-09-11 NOTE — Progress Notes (Signed)
Transfusion of PRBC x 2 units complete at 0500 am.  BP remains low 77/41.  Pt arousable and oriented.  Pt has made no urine output and will perform bladder scan.  Pt has had no emesis or stool but concerned for bleeding.  Will change timed CBC for STAT and have drawn now.  Lab notified, venipuncture will come ASAP continue to monitor.   L.Chellie Vanlue RN

## 2012-09-11 NOTE — Progress Notes (Signed)
eLink Physician-Brief Progress Note Patient Name: [name] DOB: June 18, 1970 MRN: [id number]  Date of Service  09/11/2012   HPI/Events of Note  Report from nurse re patient's inability to void with greater than 700cc urine in bladder via bladder scan.   eICU Interventions  Plan: Insert foley catheter   Intervention Category Intermediate Interventions: Oliguria - evaluation and management  DETERDING,ELIZABETH 09/11/2012, 6:46 AM

## 2012-09-12 LAB — TYPE AND SCREEN
ABO/RH(D): O NEG
Unit division: 0
Unit division: 0

## 2012-09-12 LAB — CBC
HCT: 25.3 % — ABNORMAL LOW (ref 36.0–46.0)
Hemoglobin: 8.8 g/dL — ABNORMAL LOW (ref 12.0–15.0)
MCH: 32.1 pg (ref 26.0–34.0)
MCHC: 34.8 g/dL (ref 30.0–36.0)
MCV: 92.3 fL (ref 78.0–100.0)

## 2012-09-12 LAB — GLUCOSE, CAPILLARY
Glucose-Capillary: 83 mg/dL (ref 70–99)
Glucose-Capillary: 81 mg/dL (ref 70–99)

## 2012-09-12 LAB — BASIC METABOLIC PANEL
BUN: 7 mg/dL (ref 6–23)
Calcium: 7.8 mg/dL — ABNORMAL LOW (ref 8.4–10.5)
Creatinine, Ser: 0.4 mg/dL — ABNORMAL LOW (ref 0.50–1.10)
GFR calc non Af Amer: >90 mL/min (ref >90)
Glucose, Bld: 78 mg/dL (ref 70–99)
Sodium: 127 mEq/L — ABNORMAL LOW (ref 135–145)

## 2012-09-12 LAB — PROTIME-INR: Prothrombin Time: 16.7 seconds — ABNORMAL HIGH (ref 11.6–15.2)

## 2012-09-12 LAB — HEPATIC FUNCTION PANEL
Alkaline Phosphatase: 151 U/L — ABNORMAL HIGH (ref 39–117)
Indirect Bilirubin: 2.1 mg/dL — ABNORMAL HIGH (ref 0.3–0.9)
Total Bilirubin: 5.9 mg/dL — ABNORMAL HIGH (ref 0.3–1.2)

## 2012-09-12 MED ORDER — VITAMIN B-1 100 MG PO TABS
100.0000 mg | ORAL_TABLET | Freq: Every day | ORAL | Status: DC
  Administered 2012-09-12 – 2012-09-14 (×3): 100 mg via ORAL
  Filled 2012-09-12 (×3): qty 1

## 2012-09-12 MED ORDER — RIFAXIMIN 550 MG PO TABS
550.0000 mg | ORAL_TABLET | Freq: Two times a day (BID) | ORAL | Status: DC
  Administered 2012-09-12 – 2012-09-14 (×5): 550 mg via ORAL
  Filled 2012-09-12 (×6): qty 1

## 2012-09-12 MED ORDER — URSODIOL 300 MG PO CAPS
300.0000 mg | ORAL_CAPSULE | Freq: Two times a day (BID) | ORAL | Status: DC
  Administered 2012-09-12 – 2012-09-14 (×5): 300 mg via ORAL
  Filled 2012-09-12 (×6): qty 1

## 2012-09-12 MED ORDER — FLUOXETINE HCL 10 MG PO CAPS
10.0000 mg | ORAL_CAPSULE | Freq: Every day | ORAL | Status: DC
  Administered 2012-09-12 – 2012-09-14 (×3): 10 mg via ORAL
  Filled 2012-09-12 (×3): qty 1

## 2012-09-12 MED ORDER — FOLIC ACID 1 MG PO TABS
1.0000 mg | ORAL_TABLET | Freq: Every day | ORAL | Status: DC
  Administered 2012-09-12 – 2012-09-14 (×3): 1 mg via ORAL
  Filled 2012-09-12 (×3): qty 1

## 2012-09-12 MED ORDER — ENSURE PUDDING PO PUDG
1.0000 | Freq: Three times a day (TID) | ORAL | Status: DC
  Administered 2012-09-12 – 2012-09-13 (×4): 1 via ORAL

## 2012-09-12 NOTE — Progress Notes (Signed)
Pt transfer from 2S10 oriented to room and call bell, able to make needs known. No c/o of pain or SOB. Will monitor pt through shift.

## 2012-09-12 NOTE — Progress Notes (Signed)
PULMONARY  / CRITICAL CARE MEDICINE  Name: [name] MRN: [id number] DOB: 1970/08/11    ADMISSION DATE:  09/10/2012 CONSULTATION DATE:  09/10/2012  REFERRING MD :  EDP PRIMARY SERVICE:  PCCM  CHIEF COMPLAINT:  Upper GI hemorrhage  BRIEF PATIENT DESCRIPTION: 42 yo with past medical history of Sclerosing Cholangeitis, cirrhosis,  ETOH abuse and chronic NSAID use brought to ED with several episodes of large volume hematemesis since 1 day ago. Denies melena, hematochezia or abdominal pain.  Reports weakness / dizziness. In ED somewhat hypotensive, with elevated lactate.  PCCM was consulted.  SIGNIFICANT EVENTS / STUDIES:   LINES / TUBES: NGT 9/8 >>>9/8  CULTURES:  ANTIBIOTICS: Ceftriaxone 9/8 >>>   INTERVAL HISTORY: denies abdominal pain Afebrile Thirsty   VITAL SIGNS: Temp:  [98.2 F (36.8 C)-98.9 F (37.2 C)] 98.2 F (36.8 C) (09/10 0442) Pulse Rate:  [78-95] 81 (09/10 0700) Resp:  [8-22] 19 (09/10 0700) BP: (81-101)/(34-56) 82/44 mmHg (09/10 0700) SpO2:  [95 %-100 %] 95 % (09/10 0700) Weight:  [64.8 kg (142 lb 13.7 oz)] 64.8 kg (142 lb 13.7 oz) (09/10 0600)HEMODYNAMICS:   VENTILATOR SETTINGS:   INTAKE / OUTPUT: Intake/Output     09/09 0701 - 09/10 0700 09/10 0701 - 09/11 0700   I.V. (mL/kg) 2375 (36.7)    Blood 350    IV Piggyback 100    Total Intake(mL/kg) 2825 (43.6)    Urine (mL/kg/hr) 1580 (1)    Total Output 1580     Net +1245           PHYSICAL EXAMINATION: General:  Appears acutely ill Neuro:  Awake, alert, cooperative, non focal, no asterexis HEENT:  PERRL, dry membranes, scleral icterus Cardiovascular:  Tachycardic, regular Lungs:  Bilateral diminished air entry, no w/r/r Abdomen:  Soft, nontender, bowel sounds diminished Musculoskeletal:  Moves all extremities, no edema Skin:  Intact  LABS:  Recent Labs Lab 09/07/12 1246  09/10/12 1948 09/10/12 1950 09/10/12 2027 09/10/12 2228 09/11/12 0035 09/11/12 0544 09/11/12 1310 09/11/12 2054  09/12/12 0540  HGB  --   < > 6.3*  --  6.8*  --  5.0* 7.2* 9.0* 8.7* 8.8*  WBC  --   < > 13.8*  --   --   --  7.5 8.0 9.0 9.0 8.1  PLT  --   < > 259  --   --   --  168 151 176 177 166  NA 129*  --  124*  --  127*  --   --   --   --   --  127*  K 3.9  --  3.9  --  3.5  --   --   --   --   --  3.4*  CL 95*  --  83*  --  83*  --   --   --   --   --  94*  CO2 25  --  31  --   --   --   --   --   --   --  26  GLUCOSE 139*  --  168*  --  170*  --   --   --   --   --  78  BUN 5*  --  12  --  12  --   --   --   --   --  7  CREATININE 0.39*  --  0.50  --  0.80  --   --   --   --   --  0.40*  CALCIUM 8.8  --  8.2*  --   --   --   --   --   --   --  7.8*  MG  --   --   --   --   --   --  1.5  --   --   --   --   PHOS  --   --   --   --   --   --  3.0  --   --   --   --   AST 77*  --  94*  --   --   --   --   --   --   --  163*  ALT 24  --  29  --   --   --   --   --   --   --  32  ALKPHOS 223*  --  177*  --   --   --   --   --   --   --  151*  BILITOT 6.9*  --  5.4*  --   --   --   --   --   --   --  5.9*  PROT 7.1  --  6.0  --   --   --   --   --   --   --  5.3*  ALBUMIN 2.3*  --  2.0*  --   --   --   --   --   --   --  1.7*  APTT  --   --   --   --   --   --  35  --   --   --   --   INR  --   --   --   --   --   --  1.71*  --   --   --  1.39  LATICACIDVEN  --   < >  --  6.1*  --  5.0*  --  1.2  --   --   --   < > = values in this interval not displayed.  Recent Labs Lab 09/11/12 1205 09/11/12 1539 09/11/12 2002 09/11/12 2322 09/12/12 0432  GLUCAP 109* 108* 89 84 83     ASSESSMENT / PLAN:  PULMONARY A:  No active issues.  P:   Gaol SpO2>92 Supplemental oxygen PRN  CARDIOVASCULAR A: Hemorrhagic shock. Lactic acidosis likely exacerbated by impaired liver function. P:  Goal SBP>90, lactate resolution good sign Hold Nadolol  RENAL A:  Hyponatremia.  Mild hypokalemia. Normal renal function. P:   NS@75  Hold Lasix / Aldactone Replete K  GASTROINTESTINAL A:  Upper GI  hemorrhage EGD  - moderate esophageal varices, with stigma of recent hemorrhage s/p banding  Sclerosing cholangeitis.  Liver cirrhosis.  P:   Clear liquids -advance Protonix bid Octreotide gtt -stop at 48h Lactulose / Rifaximin / Ursodiol  Rpt banding in a few weeks, has been referred to [facility] for transplant listing  HEMATOLOGIC A:  Acute blood loss anemia in setting of acute upper GI hemorrhage. P:  Goal Hb > 8 in setting of active hemorrhage SCDs for DVT Px Vit K x1 - rpt INR ok  INFECTIOUS A:  No evidence of active infection. P:   Ceftriaxone for SBP Px as above  ENDOCRINE  A:  Hyperglycemia -resolved P:   Dc SSI  NEUROLOGIC A:  History of anxiety / depression. ETOH abuse.  P:   Prozac  CIWA / Thiamine / Folate  I have personally obtained a history, examined the patient, evaluated laboratory and imaging results, formulated the assessment and plan and placed orders.  OK to transfer to med-surg floor & [facility] in am - advance PO  Oretha Milch., MD Pulmonary and Critical Care Medicine [facility] Pager: [phone]  09/12/2012, 7:59 AM

## 2012-09-12 NOTE — Care Management Note (Signed)
    Page 1 of 1   09/14/2012     11:18:04 AM   CARE MANAGEMENT NOTE 09/14/2012  Patient:  [name], [name]   Account Number:  [id number]  Date Initiated:  09/12/2012  Documentation initiated by:  Avie Arenas  Subjective/Objective Assessment:   Admitted with GIB     Action/Plan:   Anticipated DC Date:  09/14/2012   Anticipated DC Plan:  HOME/SELF CARE      DC Planning Services  CM consult      Choice offered to / List presented to:             Status of service:  Completed, signed off Medicare Important Message given?   (If response is "NO", the following Medicare IM given date fields will be blank) Date Medicare IM given:   Date Additional Medicare IM given:    Discharge Disposition:  HOME/SELF CARE  Per UR Regulation:  Reviewed for med. necessity/level of care/duration of stay  If discussed at Long Length of Stay Meetings, dates discussed:    Comments:  Contact:  Lewis,Paul Significant other     [phone]                 Graciella Belton Mother [phone]  09/14/12 11:08 Letha Cape RN, BSN [phone] patient is for possible dc today, xifaxin has been pre authorized- [phone] P).  Informed MD.  Patient has transportation at dc.  Per MD he is putting her on a preferred drug vs nadalol, which should be covered by Medicaid.  Patient is also indep.  She will have paracentesis today and then dc to home.  09-12-12 12noon Avie Arenas, RNBSN [phone] Talked with patient and mother.  Notified me that xifaxin that she was discharge on on last admission was not on Medicaid preferred list and she was not able to get. Placed form for approval of this medication on chart that will need to be filled out by physician prior to dc if they still want this med to continue. Form is in front of hard chart.  Patient was also out of Nadolol and again has not been taking.  Will need a prescription for this also.  No other needs voiced at this time.

## 2012-09-12 NOTE — Progress Notes (Signed)
Lab called twice to collect labs. No one has yet to arrive to draw AM labs and serial CBC. Lab was notified at 2030 on 09/11/12 of serial CBC draws and was notified again at 0330 09/12/12 of serial CBC and AM lab draws. Lab was called at 0500 am to find out why no one has been able to draw labs on my pt. Lab stated they would find the phlebotomist and send someone over to draw them. Will continue to monitor.

## 2012-09-12 NOTE — Progress Notes (Signed)
[facility] PROGRESS NOTE Subjective Pt w/o further bleeding. VS stable more alert  Objective: Vital signs in last 24 hours: Temp:  [97.9 F (36.6 C)-98.9 F (37.2 C)] 97.9 F (36.6 C) (09/10 0802) Pulse Rate:  [75-93] 75 (09/10 1000) Resp:  [8-22] 11 (09/10 1000) BP: (81-101)/(34-56) 92/52 mmHg (09/10 1000) SpO2:  [95 %-100 %] 98 % (09/10 1000) Weight:  [64.8 kg (142 lb 13.7 oz)] 64.8 kg (142 lb 13.7 oz) (09/10 0600) Last BM Date: 09/11/12  Intake/Output from previous day: 09/09 0701 - 09/10 0700 In: 2825 [I.V.:2375; Blood:350; IV Piggyback:100] Out: 1580 [Urine:1580] Intake/Output this shift: Total I/O In: 200 [I.V.:200] Out: -   PE: General--icteric Heart-- Lungs--poor BSs Abdomen--ascites, nontender  Lab Results:  Recent Labs  09/11/12 0035 09/11/12 0544 09/11/12 1310 09/11/12 2054 09/12/12 0540  WBC 7.5 8.0 9.0 9.0 8.1  HGB 5.0* 7.2* 9.0* 8.7* 8.8*  HCT 14.6* 20.7* 25.6* 25.0* 25.3*  PLT 168 151 176 177 166   BMET  Recent Labs  09/10/12 1948 09/10/12 2027 09/12/12 0540  NA 124* 127* 127*  K 3.9 3.5 3.4*  CL 83* 83* 94*  CO2 31  --  26  CREATININE 0.50 0.80 0.40*   LFT  Recent Labs  09/10/12 1948 09/12/12 0540  PROT 6.0 5.3*  AST 94* 163*  ALT 29 32  ALKPHOS 177* 151*  BILITOT 5.4* 5.9*  BILIDIR  --  3.8*  IBILI  --  2.1*   PT/INR  Recent Labs  09/11/12 0035 09/12/12 0540  LABPROT 19.6* 16.7*  INR 1.71* 1.39   PANCREAS No results found for this basename: LIPASE,  in the last 72 hours       Studies/Results: No results found.  Medications: I have reviewed the patient's current medications.  Assessment/Plan: 1. Cirrhosis with variceal bleed. Stable on octreotide s/p banding. Will advance to [facility] diet will need octreotide 3-4 more days if stable. In the process of referral to [facility] for transplant listing after completing substance abuse program.   Zanyia Silbaugh JR,Cyler Kappes L 09/12/2012, 10:45 AM

## 2012-09-13 DIAGNOSIS — E7220 Disorder of urea cycle metabolism, unspecified: Secondary | ICD-10-CM

## 2012-09-13 DIAGNOSIS — E876 Hypokalemia: Secondary | ICD-10-CM

## 2012-09-13 LAB — CBC
MCHC: 34.3 g/dL (ref 30.0–36.0)
MCV: 94.7 fL (ref 78.0–100.0)
Platelets: 178 10*3/uL (ref 150–400)
RDW: 21 % — ABNORMAL HIGH (ref 11.5–15.5)
WBC: 8.1 10*3/uL (ref 4.0–10.5)

## 2012-09-13 LAB — BASIC METABOLIC PANEL
BUN: 4 mg/dL — ABNORMAL LOW (ref 6–23)
CO2: 26 mEq/L (ref 19–32)
Calcium: 7.8 mg/dL — ABNORMAL LOW (ref 8.4–10.5)
Creatinine, Ser: 0.38 mg/dL — ABNORMAL LOW (ref 0.50–1.10)
GFR calc Af Amer: >90 mL/min (ref >90)

## 2012-09-13 MED ORDER — POTASSIUM CHLORIDE 20 MEQ/15ML (10%) PO LIQD
40.0000 meq | Freq: Every day | ORAL | Status: DC
  Administered 2012-09-13 – 2012-09-14 (×2): 40 meq via ORAL
  Filled 2012-09-13 (×3): qty 30

## 2012-09-13 MED ORDER — PANTOPRAZOLE SODIUM 40 MG PO TBEC
40.0000 mg | DELAYED_RELEASE_TABLET | Freq: Two times a day (BID) | ORAL | Status: DC
  Administered 2012-09-13 – 2012-09-14 (×3): 40 mg via ORAL
  Filled 2012-09-13 (×3): qty 1

## 2012-09-13 MED ORDER — POTASSIUM CHLORIDE CRYS ER 20 MEQ PO TBCR
40.0000 meq | EXTENDED_RELEASE_TABLET | Freq: Every day | ORAL | Status: DC
  Filled 2012-09-13: qty 2

## 2012-09-13 MED ORDER — MORPHINE SULFATE 2 MG/ML IJ SOLN
1.0000 mg | INTRAMUSCULAR | Status: DC | PRN
  Administered 2012-09-13 – 2012-09-14 (×4): 1 mg via INTRAVENOUS
  Filled 2012-09-13 (×4): qty 1

## 2012-09-13 MED ORDER — FUROSEMIDE 20 MG PO TABS
20.0000 mg | ORAL_TABLET | Freq: Every day | ORAL | Status: DC
  Administered 2012-09-13 – 2012-09-14 (×2): 20 mg via ORAL
  Filled 2012-09-13 (×2): qty 1

## 2012-09-13 MED ORDER — SPIRONOLACTONE 100 MG PO TABS
100.0000 mg | ORAL_TABLET | Freq: Every day | ORAL | Status: DC
  Administered 2012-09-13 – 2012-09-14 (×2): 100 mg via ORAL
  Filled 2012-09-13 (×2): qty 1

## 2012-09-13 MED ORDER — LACTULOSE 10 GM/15ML PO SOLN
20.0000 g | Freq: Two times a day (BID) | ORAL | Status: DC
  Administered 2012-09-13 – 2012-09-14 (×3): 20 g via ORAL
  Filled 2012-09-13 (×4): qty 30

## 2012-09-13 MED ORDER — PROPRANOLOL HCL 20 MG PO TABS
20.0000 mg | ORAL_TABLET | Freq: Two times a day (BID) | ORAL | Status: DC
  Administered 2012-09-14: 20 mg via ORAL
  Filled 2012-09-13 (×3): qty 1

## 2012-09-13 NOTE — Progress Notes (Signed)
[facility] PROGRESS NOTE Subjective No further bleeding feels good, has tolerated FLs  Objective: Vital signs in last 24 hours: Temp:  [98.4 F (36.9 C)-98.7 F (37.1 C)] 98.4 F (36.9 C) (09/11 1050) Pulse Rate:  [72-84] 73 (09/11 1050) Resp:  [16-20] 18 (09/11 1050) BP: (93-102)/(52-61) 98/54 mmHg (09/11 1050) SpO2:  [98 %-99 %] 99 % (09/11 1050) Weight:  [68.1 kg (150 lb 2.1 oz)-68.221 kg (150 lb 6.4 oz)] 68.221 kg (150 lb 6.4 oz) (09/11 0558) Last BM Date: 09/09/12  Intake/Output from previous day: 09/10 0701 - 09/11 0700 In: 1730 [P.O.:930; I.V.:800] Out: 1200 [Urine:1200] Intake/Output this shift: Total I/O In: 600 [P.O.:600] Out: 100 [Urine:100]  PE: General--alert oriented, icteric Heart-- Lungs--clear Abdomen--ascites, nontender   Lab Results:  Recent Labs  09/11/12 0544 09/11/12 1310 09/11/12 2054 09/12/12 0540 09/13/12 0715  WBC 8.0 9.0 9.0 8.1 8.1  HGB 7.2* 9.0* 8.7* 8.8* 8.5*  HCT 20.7* 25.6* 25.0* 25.3* 24.8*  PLT 151 176 177 166 178   BMET  Recent Labs  09/10/12 1948 09/10/12 2027 09/12/12 0540 09/13/12 0715  NA 124* 127* 127* 129*  K 3.9 3.5 3.4* 3.6  CL 83* 83* 94* 98  CO2 31  --  26 26  CREATININE 0.50 0.80 0.40* 0.38*   LFT  Recent Labs  09/10/12 1948 09/12/12 0540  PROT 6.0 5.3*  AST 94* 163*  ALT 29 32  ALKPHOS 177* 151*  BILITOT 5.4* 5.9*  BILIDIR  --  3.8*  IBILI  --  2.1*   PT/INR  Recent Labs  09/11/12 0035 09/12/12 0540  LABPROT 19.6* 16.7*  INR 1.71* 1.39   PANCREAS No results found for this basename: LIPASE,  in the last 72 hours       Studies/Results: No results found.  Medications: I have reviewed the patient's current medications.  Assessment/Plan: 1. Variceal Bleed. Appears stable continue octreotide 1 more day  Will begin propranolol to reduce the portal pressure at 20 mg BID 2. Ascites. Was on lasix and spironolactone as OP and will resume   [name],[name] 09/13/2012,  2:04 PM

## 2012-09-13 NOTE — Progress Notes (Signed)
[facility] PROGRESS NOTE  [name] ZOX:[id number] DOB: 1970-05-10 DOA: 09/10/2012 PCP: Gabriel Cirri, DO  Assessment/Plan: 1-Acute upper GI hemorrhage: secondary to esophageal varices. -s/p EGD with varices banding -will continue octreotide until 9/13 following GI recommendations -will advance diet to low sodium -follow Hgb trend -continue PPI  2-Hemorrhagic shock: due to #1 and continue use of diuretics and nadolol (treatment for her liver cirrhosis). -BP now stable and patient w/o further episodes of bleeding -will slowly restart her diuretics again and follow VS  3-Acute blood loss anemia: due to #1. Patient Hgb stable after transfusion. -Today Hgb 8.5 -will continue PPI -will continue octreotide -goal is Hgb > 8.0  4-GERD: continue PPI  5-Liver cirrhosis: chronic. Due to alcohol and congenital sclerosing cholangitis. -patient counseled about alcohol cessation -plan is for referral to [facility] for enrolment in liver transplant list -will follow with substance cessation program as an outpatient  6-hyperammoniemia: due to liver cirrhosis and the fact that while she was NPO no lactulose or rifaximin were given. -patient is AAOX3 -will resume lactulose and rifaximin  7-Hypokalemia: will replete and check Mg level  8-Hx of alcohol abuse:continue folic acid and thiamine. Cessation counseling provided. -plan is for substance cessation program as an outpatient.  DVT: SCD's  Code Status: Full Family Communication: no family at bedside Disposition Plan: home when medically stable  Consultants:  GI (Dr. Randa Evens)   Procedures:  EGD (with esophageal varices banding; done on 09/10/12)  Antibiotics:  Rocephin   HPI/Subjective: Afebrile, hungry and feeling better. Patient w/o further episodes of hematemesis. Denies N/V or abd pain.  Objective: Filed Vitals:   09/13/12 0558  BP: 96/61  Pulse: 76  Temp: 98.5 F (36.9 C)  Resp: 20    Intake/Output Summary  (Last 24 hours) at 09/13/12 0807 Last data filed at 09/13/12 8119  Gross per 24 hour  Intake   1030 ml  Output   1160 ml  Net   -130 ml   Filed Weights   09/12/12 0600 09/12/12 1525 09/13/12 0558  Weight: 64.8 kg (142 lb 13.7 oz) 68.1 kg (150 lb 2.1 oz) 68.221 kg (150 lb 6.4 oz)    Exam:   General:  Afebrile, NAD, feeling better and asking for diet to be advance  Cardiovascular: S1 and S2, no rubs or gallops; positive SEM  Respiratory: CTA bilaterally  Abdomen: soft, slight distension, positive BS, no tenderness  Musculoskeletal: trace edema bilaterally, no cyanosis  Neuro: AAOX3; no CN deficit; MS 4/5 bilaterally; due to poor effort.  Data Reviewed: Basic Metabolic Panel:  Recent Labs Lab 09/07/12 1246 09/10/12 1948 09/10/12 2027 09/11/12 0035 09/12/12 0540 09/13/12 0715  NA 129* 124* 127*  --  127* 129*  K 3.9 3.9 3.5  --  3.4* 3.6  CL 95* 83* 83*  --  94* 98  CO2 25 31  --   --  26 26  GLUCOSE 139* 168* 170*  --  78 105*  BUN 5* 12 12  --  7 4*  CREATININE 0.39* 0.50 0.80  --  0.40* 0.38*  CALCIUM 8.8 8.2*  --   --  7.8* 7.8*  MG  --   --   --  1.5  --   --   PHOS  --   --   --  3.0  --   --    Liver Function Tests:  Recent Labs Lab 09/07/12 1246 09/10/12 1948 09/12/12 0540  AST 77* 94* 163*  ALT 24 29 32  ALKPHOS 223*  177* 151*  BILITOT 6.9* 5.4* 5.9*  PROT 7.1 6.0 5.3*  ALBUMIN 2.3* 2.0* 1.7*    Recent Labs Lab 09/07/12 1246 09/12/12 0540  AMMONIA 28 99*   CBC:  Recent Labs Lab 09/10/12 1948  09/11/12 0544 09/11/12 1310 09/11/12 2054 09/12/12 0540 09/13/12 0715  WBC 13.8*  < > 8.0 9.0 9.0 8.1 8.1  NEUTROABS 11.2*  --   --   --   --   --   --   HGB 6.3*  < > 7.2* 9.0* 8.7* 8.8* 8.5*  HCT 18.0*  < > 20.7* 25.6* 25.0* 25.3* 24.8*  MCV 93.8  < > 91.2 90.5 91.2 92.3 94.7  PLT 259  < > 151 176 177 166 178  < > = values in this interval not displayed.    CBG:  Recent Labs Lab 09/11/12 1539 09/11/12 2002 09/11/12 2322  09/12/12 0432 09/12/12 0805  GLUCAP 108* 89 84 83 81    Recent Results (from the past 240 hour(s))  URINE CULTURE     Status: None   Collection Time    09/07/12  1:15 PM      Result Value Range Status   Specimen Description URINE, CLEAN CATCH   Final   Special Requests NONE   Final   Culture  Setup Time     Final   Value: 09/07/2012 14:22     Performed at [facility] Count     Final   Value: NO GROWTH     Performed at [facility]   Culture     Final   Value: NO GROWTH     Performed at [facility]   Report Status 09/08/2012 FINAL   Final  MRSA PCR SCREENING     Status: None   Collection Time    09/10/12 11:13 PM      Result Value Range Status   MRSA by PCR NEGATIVE  NEGATIVE Final   Comment:            The GeneXpert MRSA Assay (FDA     approved for NASAL specimens     only), is one component of a     comprehensive MRSA colonization     surveillance program. It is not     intended to diagnose MRSA     infection nor to guide or     monitor treatment for     MRSA infections.     Studies: No results found.  Scheduled Meds: . cefTRIAXone (ROCEPHIN)  IV  1 g Intravenous Q24H  . feeding supplement  1 Container Oral TID BM  . FLUoxetine  10 mg Oral Daily  . folic acid  1 mg Oral Daily  . lactulose  20 g Oral BID  . pantoprazole (PROTONIX) IV  40 mg Intravenous Q12H  . rifaximin  550 mg Oral BID  . thiamine  100 mg Oral Daily  . ursodiol  300 mg Oral BID   Continuous Infusions: . sodium chloride 75 mL/hr at 09/13/12 0247  . octreotide (SANDOSTATIN) infusion 50 mcg/hr (09/13/12 0123)     Time spent: >30 minutes   [name]  [facility] Pager [phone]. If 7PM-7AM, please contact night-coverage at [email or web address], password [facility] 09/13/2012, 8:07 AM  LOS: 3 days

## 2012-09-14 ENCOUNTER — Inpatient Hospital Stay (HOSPITAL_COMMUNITY): Payer: Medicaid Other

## 2012-09-14 DIAGNOSIS — R188 Other ascites: Secondary | ICD-10-CM

## 2012-09-14 DIAGNOSIS — F329 Major depressive disorder, single episode, unspecified: Secondary | ICD-10-CM

## 2012-09-14 LAB — CBC
HCT: 25.6 % — ABNORMAL LOW (ref 36.0–46.0)
Hemoglobin: 8.6 g/dL — ABNORMAL LOW (ref 12.0–15.0)
MCV: 95.5 fL (ref 78.0–100.0)
RDW: 21.7 % — ABNORMAL HIGH (ref 11.5–15.5)
WBC: 8.8 10*3/uL (ref 4.0–10.5)

## 2012-09-14 LAB — BASIC METABOLIC PANEL
BUN: 4 mg/dL — ABNORMAL LOW (ref 6–23)
CO2: 23 mEq/L (ref 19–32)
Chloride: 100 mEq/L (ref 96–112)
Creatinine, Ser: 0.39 mg/dL — ABNORMAL LOW (ref 0.50–1.10)

## 2012-09-14 MED ORDER — FUROSEMIDE 20 MG PO TABS: 20.0000 mg | ORAL_TABLET | Freq: Two times a day (BID) | ORAL | Status: DC

## 2012-09-14 MED ORDER — PROPRANOLOL HCL 20 MG PO TABS: 20.0000 mg | ORAL_TABLET | Freq: Every day | ORAL | Status: DC

## 2012-09-14 MED ORDER — ALBUMIN HUMAN 25 % IV SOLN
50.0000 g | Freq: Once | INTRAVENOUS | Status: AC
  Administered 2012-09-14: 50 g via INTRAVENOUS
  Filled 2012-09-14: qty 200

## 2012-09-14 MED ORDER — LEVOFLOXACIN 500 MG PO TABS: 500.0000 mg | ORAL_TABLET | Freq: Every day | ORAL | Status: DC

## 2012-09-14 MED ORDER — RIFAXIMIN 550 MG PO TABS: 550.0000 mg | ORAL_TABLET | Freq: Two times a day (BID) | ORAL | Status: DC

## 2012-09-14 MED ORDER — LACTULOSE 10 GM/15ML PO SOLN: 20.0000 g | Freq: Two times a day (BID) | ORAL | Status: DC

## 2012-09-14 MED ORDER — MORPHINE SULFATE 2 MG/ML IJ SOLN
2.0000 mg | Freq: Once | INTRAMUSCULAR | Status: AC
  Administered 2012-09-14: 2 mg via INTRAVENOUS
  Filled 2012-09-14: qty 1

## 2012-09-14 MED ORDER — ENSURE PUDDING PO PUDG: 1.0000 | Freq: Three times a day (TID) | ORAL | Status: DC

## 2012-09-14 MED ORDER — FUROSEMIDE 20 MG PO TABS
20.0000 mg | ORAL_TABLET | Freq: Two times a day (BID) | ORAL | Status: DC
  Filled 2012-09-14 (×2): qty 1

## 2012-09-14 MED ORDER — SPIRONOLACTONE 100 MG PO TABS: 100.0000 mg | ORAL_TABLET | Freq: Two times a day (BID) | ORAL | Status: DC

## 2012-09-14 MED ORDER — PANTOPRAZOLE SODIUM 40 MG PO TBEC: 40.0000 mg | DELAYED_RELEASE_TABLET | Freq: Two times a day (BID) | ORAL | Status: DC

## 2012-09-14 MED ORDER — LEVOFLOXACIN 500 MG PO TABS
500.0000 mg | ORAL_TABLET | Freq: Every day | ORAL | Status: DC
  Filled 2012-09-14: qty 1

## 2012-09-14 NOTE — Progress Notes (Signed)
Pt is complaining of worsening abdominal distention and pain.Pt was given 1 mg of morphine.  Patient also stating that she hasn't been voiding much since her foley was discontinued. Pt has voided 400 cc of urine so far this shift. Paged MD on call Schorr. Awaiting further orders. Will continue to monitor pt.

## 2012-09-14 NOTE — Progress Notes (Signed)
Attempted to do in and out cath x 2 with no success. Will attempt to get a coworker to help

## 2012-09-14 NOTE — Procedures (Signed)
Successful US guided paracentesis from LLQ.  Yielded 2.5 liters of yellow fluid.  No immediate complications.  Pt tolerated well.   Specimen was not sent for labs.  Pattricia Boss D PA-C 09/14/2012 12:06 PM

## 2012-09-14 NOTE — Progress Notes (Signed)
Pt is to receive 2 mg of morphine and an in and out catheter for her urinary retention. Will continue to monitor pt

## 2012-09-14 NOTE — Progress Notes (Signed)
[facility] PROGRESS NOTE Subjective No gross bleeding, Still little urine   Objective: Vital signs in last 24 hours: Temp:  [98 F (36.7 C)-99.5 F (37.5 C)] 98.3 F (36.8 C) (09/12 0500) Pulse Rate:  [67-78] 70 (09/12 0939) Resp:  [18] 18 (09/11 2106) BP: (91-97)/(54-62) 97/62 mmHg (09/12 0939) SpO2:  [98 %-99 %] 98 % (09/12 0500) Weight:  [69.5 kg (153 lb 3.5 oz)] 69.5 kg (153 lb 3.5 oz) (09/12 0500) Last BM Date: 09/13/12  Intake/Output from previous day: 09/11 0701 - 09/12 0700 In: 1544.4 [P.O.:820; I.V.:724.4] Out: 925 [Urine:925] Intake/Output this shift: Total I/O In: 360 [P.O.:360] Out: -   PE: General--alert oriented icteric  Abdomen--ascites, no real change  Lab Results:  Recent Labs  09/11/12 1310 09/11/12 2054 09/12/12 0540 09/13/12 0715 09/14/12 0620  WBC 9.0 9.0 8.1 8.1 8.8  HGB 9.0* 8.7* 8.8* 8.5* 8.6*  HCT 25.6* 25.0* 25.3* 24.8* 25.6*  PLT 176 177 166 178 170   BMET  Recent Labs  09/12/12 0540 09/13/12 0715 09/14/12 0620  NA 127* 129* 130*  K 3.4* 3.6 3.8  CL 94* 98 100  CO2 26 26 23   CREATININE 0.40* 0.38* 0.39*   LFT  Recent Labs  09/12/12 0540  PROT 5.3*  AST 163*  ALT 32  ALKPHOS 151*  BILITOT 5.9*  BILIDIR 3.8*  IBILI 2.1*   PT/INR  Recent Labs  09/12/12 0540  LABPROT 16.7*  INR 1.39   PANCREAS No results found for this basename: LIPASE,  in the last 72 hours       Studies/Results: No results found.  Medications: I have reviewed the patient's current medications.  Assessment/Plan: 1. Variceal Bleed. Stable after banding and octreotide Starting on propranolol for prophylaxis 2. Ascites. No real improvement  Plan: 1. Will increase the spironolactone 2. Would stop octreotide after today home if stable 3. See Dr Bosie Clos in office within next week to adjust the propranolol and diuretics.    Erion Weightman JR,Morey Andonian L 09/14/2012, 11:31 AM

## 2012-09-14 NOTE — Discharge Summary (Signed)
Physician Discharge Summary  [name] FAO:[id number] DOB: September 30, 1970 DOA: 09/10/2012  PCP: Gabriel Cirri, DO  Admit date: 09/10/2012 Discharge date: 09/14/2012  Time spent: >30 minutes  Recommendations for Outpatient Follow-up:  1. BMET to follow electrolytes and kidney function 2. CBC to follow Hgb trend  Discharge Diagnoses:  Active Problems:   Acute upper GI hemorrhage   Hemorrhagic shock   Acute blood loss anemia liver cirrhosis Alcohol abuse Lactic acidosis GERD Hyperammonemia (no encephalopathy)  Discharge Condition: stable and improved. Will follow with GI in 1 week and with PCP in 2 weeks. Advised to stop alcohol and to take medications as prescribed  Diet recommendation: Low sodium diet  Filed Weights   09/12/12 1525 09/13/12 0558 09/14/12 0500  Weight: 68.1 kg (150 lb 2.1 oz) 68.221 kg (150 lb 6.4 oz) 69.5 kg (153 lb 3.5 oz)    History of present illness:  42 yo with past medical history of Sclerosing Cholangeitis, ETOH abuse and chronic NSAID use brought to ED with several episodes of large volume hematemesis since 1 day ago. Denies melena, hematochezia or abdominal pain. Reports weakness / dizziness. In ED somewhat hypotensive, with elevated lactate.   Hospital Course:  1-Acute upper GI hemorrhage: secondary to esophageal varices.  -s/p EGD with varices banding on 9/8 -diet tolerated and no further episodes of bleeding -follow Hgb trend as an outpatient -continue PPI and b-blocker  2-Hemorrhagic shock: due to #1 and continue use of diuretics and propanolol (treatment for her liver cirrhosis).  -BP now stable and patient w/o further episodes of bleeding  -will slowly restart her diuretics and b-blocker; follow VS as an outpatient and adjust meds as needed   3-Acute blood loss anemia: due to #1. Patient Hgb stable after transfusion.  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  -patient counseled about alcohol cessation  -plan is for referral to [facility] for enrolment in liver transplant list  -will follow with substance cessation program as an outpatient  -paracentesis done prior to discharge (therapeutic) approx 3L removed. -will resume spironolactone and lasix -patient will also continue lactulose and spironolactone -GI will follow her in 1 week as an outpatient  6-hyperammoniemia: due to liver cirrhosis and the fact that while she was NPO no lactulose or rifaximin were given.  -patient is AAOX3  -will resume lactulose and rifaximin   7-Hypokalemia: repleted. Mg WNL. -patient will continue supplementation   8-Hx of alcohol abuse:continue folic acid and thiamine. Cessation counseling provided.  -plan is for substance cessation program as an outpatient. -continue folic acid and thiamine.  9-Lactic acidosis: due to dehydration and hemorrhagic shock most likely.  *rest of medical problems stable and plan is to continue    Procedures:  EGD on 09/10/12 (with esophageal varices banding)  Consultations:  GI  CCM  Discharge Exam: Filed Vitals:   09/14/12 1600  BP: 90/58  Pulse: 64  Temp: 97.8 F (36.6 C)  Resp: 18   General: Afebrile, NAD, feeling better and asking for diet to be advance  Cardiovascular: S1 and S2, no rubs or gallops; positive SEM  Respiratory: CTA bilaterally  Abdomen: soft, mild distension and ascites (after paracentesis), positive BS, no tenderness  Musculoskeletal: trace edema bilaterally, no cyanosis  Neuro: AAOX3; no CN deficit; MS 4/5 bilaterally; due to poor effort.  Discharge Instructions  Discharge Orders   Future Appointments Provider Department Dept Phone   09/21/2012 11:00 AM [facility] Covering Provider [facility] [place] [phone]   Future Orders Complete By Expires   Diet - low sodium heart healthy  As  directed    Discharge instructions  As directed    Comments:     Take medications as prescribed Follow low sodium diet Arrange follow up with PCP in 10 days Please make sure to follow with the abstinence program and at [facility] to be enrol in transplant program list       Medication List    STOP taking these medications       ibuprofen 200 MG tablet  Commonly known as:  ADVIL,MOTRIN     nadolol 20 MG tablet  Commonly known as:  CORGARD      TAKE these medications       feeding supplement Pudg  Take 1 Container by mouth 3 (three) times daily between meals.     FLUoxetine 10 MG capsule  Commonly known as:  PROZAC  Take 1 capsule (10 mg total) by mouth daily.     folic acid 1 MG tablet  Commonly known as:  FOLVITE  Take 1 tablet (1 mg total) by mouth daily.     furosemide 20 MG tablet  Commonly known as:  LASIX  Take 1 tablet (20 mg total) by mouth 2 (two) times daily.     lactulose 10 GM/15ML solution  Commonly known as:  CHRONULAC  Take 30 mLs (20 g total) by mouth 2 (two) times daily.     levofloxacin 500 MG tablet  Commonly known as:  LEVAQUIN  Take 1 tablet (500 mg total) by mouth daily.     multivitamin with minerals Tabs tablet  Take 1 tablet by mouth daily.     pantoprazole 40 MG tablet  Commonly known as:  PROTONIX  Take 1 tablet (40 mg total) by mouth 2 (two) times daily.     potassium chloride 20 MEQ/15ML (10%) solution  Take 15 mLs (20 mEq total) by mouth 2 (two) times daily.     propranolol 20 MG tablet  Commonly known as:  INDERAL  Take 1 tablet (20 mg total) by mouth daily.  Start taking on:  09/15/2012     rifaximin 550 MG Tabs tablet  Commonly known as:  XIFAXAN  Take 1 tablet (550 mg total) by mouth 2 (two) times daily.     spironolactone 100 MG tablet  Commonly known as:  ALDACTONE  Take 2 tablets (200 mg total) by mouth daily.     spironolactone 100 MG tablet  Commonly known as:  ALDACTONE  Take 1 tablet (100 mg total) by mouth 2  (two) times daily.     thiamine 100 MG tablet  Take 1 tablet (100 mg total) by mouth daily.     traMADol 50 MG tablet  Commonly known as:  ULTRAM  Take 1 tablet (50 mg total) by mouth every 6 (six) hours as needed for pain.     ursodiol 300 MG capsule  Commonly known as:  ACTIGALL  Take 300 mg by mouth 2 (two) times daily.       No Known Allergies     Follow-up Information   Follow up with [facility] On 09/21/2012. (11 am for hospital folow up.)    Contact information:   [address] [place] [address] [phone]      The  results of significant diagnostics from this hospitalization (including imaging, microbiology, ancillary and laboratory) are listed below for reference.    Significant Diagnostic Studies: US Abdomen Limited  09/07/2012   *RADIOLOGY REPORT*  Clinical Data: Patient with abdominal swelling and distention as well as soft tissue edema.  Previous history of ascites.  Request for therapeutic paracentesis.  ULTRASOUND ABDOMEN LIMITED  Comparison:  Previous paracentesis  An ultrasound guided paracentesis was thoroughly discussed with the patient and questions answered.  The benefits, risks, alternatives and complications were also discussed.  The patient understands and wishes to proceed with the procedure.  Written consent was obtained.  Findings:  Ultrasound of the abdomen demonstrates a small amount of perihepatic ascites, as well as a small pocket of ascites in the left lower quadrant.  However, due to the small window for access and close proximity to bowel, it was not felt safe for paracentesis.  IMPRESSION: Small volume of ascites not amenable for paracentesis.  Read by: Brayton El, P.A,-C   Original Report Authenticated By: Richarda Overlie, M.D.   US Paracentesis  09/14/2012   *RADIOLOGY REPORT*  Clinical Data: Cirrhosis, recurrent ascites  ULTRASOUND GUIDED PARACENTESIS  An ultrasound guided paracentesis was thoroughly  discussed with the patient and questions answered.  The benefits, risks, alternatives and complications were also discussed.  The patient understands and wishes to proceed with the procedure.  Written consent was obtained.  Ultrasound was performed to localize and mark an adequate pocket of fluid in the left lower quadrant of the abdomen.  The area was then prepped and draped in the normal sterile fashion.  1% Lidocaine was used for local anesthesia.  Under ultrasound guidance a 19 gauge Yueh catheter was introduced.  Paracentesis was performed.  The catheter was removed and a dressing applied.  Complications:  none  Findings:  A total of approximately 2.5 liters of yellow colored fluid was removed.  A fluid sample was not sent for laboratory analysis.  IMPRESSION: Successful ultrasound guided paracentesis yielding 2.5 liters of ascites.  Read By: Pattricia Boss PA-C   Original Report Authenticated By: Malachy Moan, M.D.   US Paracentesis  08/31/2012   *RADIOLOGY REPORT*  Clinical Data: ascites, abdominal pain r/o SBP  ULTRASOUND GUIDED PARACENTESIS  An ultrasound guided paracentesis was thoroughly discussed with the patient and questions answered.  The benefits, risks, alternatives and complications were also discussed.  The patient understands and wishes to proceed with the procedure.  Written consent was obtained.  Ultrasound was performed to localize and mark an adequate pocket of fluid in the right upper quadrant of the abdomen.  The area was then prepped and draped in the normal sterile fashion.  1% Lidocaine was used for local anesthesia.  Under ultrasound guidance a 19 gauge Yueh catheter was introduced.  Paracentesis was performed.  The catheter was removed and a dressing applied.  Complications:  none  Findings:  A total of approximately 170cc of yellow fluid was removed.  A fluid sample was sent for laboratory analysis.  IMPRESSION: Successful ultrasound guided paracentesis yielding 170cc of  ascites.  Read By: Pattricia Boss PA-C   Original Report Authenticated By: Judie Petit. Miles Costain, M.D.    Microbiology: Recent Results (from the past 240 hour(s))  URINE CULTURE     Status: None   Collection Time    09/07/12  1:15 PM      Result Value Range Status   Specimen Description URINE, CLEAN CATCH   Final   Special Requests NONE  Final   Culture  Setup Time     Final   Value: 09/07/2012 14:22     Performed at [facility] Count     Final   Value: NO GROWTH     Performed at [facility]   Culture     Final   Value: NO GROWTH     Performed at [facility]   Report Status 09/08/2012 FINAL   Final  MRSA PCR SCREENING     Status: None   Collection Time    09/10/12 11:13 PM      Result Value Range Status   MRSA by PCR NEGATIVE  NEGATIVE Final   Comment:            The GeneXpert MRSA Assay (FDA     approved for NASAL specimens     only), is one component of a     comprehensive MRSA colonization     surveillance program. It is not     intended to diagnose MRSA     infection nor to guide or     monitor treatment for     MRSA infections.     Labs: Basic Metabolic Panel:  Recent Labs Lab 09/10/12 1948 09/10/12 2027 09/11/12 0035 09/12/12 0540 09/13/12 0715 09/14/12 0620  NA 124* 127*  --  127* 129* 130*  K 3.9 3.5  --  3.4* 3.6 3.8  CL 83* 83*  --  94* 98 100  CO2 31  --   --  26 26 23   GLUCOSE 168* 170*  --  78 105* 119*  BUN 12 12  --  7 4* 4*  CREATININE 0.50 0.80  --  0.40* 0.38* 0.39*  CALCIUM 8.2*  --   --  7.8* 7.8* 8.0*  MG  --   --  1.5  --   --  1.6  PHOS  --   --  3.0  --   --   --    Liver Function Tests:  Recent Labs Lab 09/10/12 1948 09/12/12 0540  AST 94* 163*  ALT 29 32  ALKPHOS 177* 151*  BILITOT 5.4* 5.9*  PROT 6.0 5.3*  ALBUMIN 2.0* 1.7*    Recent Labs Lab 09/12/12 0540  AMMONIA 99*   CBC:  Recent Labs Lab 09/10/12 1948  09/11/12 1310 09/11/12 2054 09/12/12 0540 09/13/12 0715 09/14/12 0620   WBC 13.8*  < > 9.0 9.0 8.1 8.1 8.8  NEUTROABS 11.2*  --   --   --   --   --   --   HGB 6.3*  < > 9.0* 8.7* 8.8* 8.5* 8.6*  HCT 18.0*  < > 25.6* 25.0* 25.3* 24.8* 25.6*  MCV 93.8  < > 90.5 91.2 92.3 94.7 95.5  PLT 259  < > 176 177 166 178 170  < > = values in this interval not displayed.  CBG:  Recent Labs Lab 09/11/12 1539 09/11/12 2002 09/11/12 2322 09/12/12 0432 09/12/12 0805  GLUCAP 108* 89 84 83 81    Signed:  Omelia Marquart  [facility] 09/14/2012, 5:48 PM

## 2012-09-14 NOTE — Progress Notes (Signed)
In and Out Cath completed. Urine returned only about 200cc.

## 2012-09-21 ENCOUNTER — Ambulatory Visit: Payer: Self-pay

## 2012-11-04 ENCOUNTER — Inpatient Hospital Stay (HOSPITAL_COMMUNITY)
Admission: EM | Admit: 2012-11-04 | Discharge: 2012-11-07 | DRG: 368 | Disposition: A | Discharge status: Home / Self Care | Payer: Medicaid Other | Attending: Internal Medicine | Admitting: Internal Medicine

## 2012-11-04 ENCOUNTER — Encounter (HOSPITAL_COMMUNITY)
Admission: EM | Disposition: A | Discharge status: Home / Self Care | Payer: Self-pay | Source: Home / Self Care | Attending: Internal Medicine

## 2012-11-04 ENCOUNTER — Encounter (HOSPITAL_COMMUNITY): Payer: Self-pay | Admitting: Emergency Medicine

## 2012-11-04 DIAGNOSIS — K7290 Hepatic failure, unspecified without coma: Secondary | ICD-10-CM

## 2012-11-04 DIAGNOSIS — K8309 Other cholangitis: Secondary | ICD-10-CM | POA: Diagnosis present

## 2012-11-04 DIAGNOSIS — D649 Anemia, unspecified: Secondary | ICD-10-CM

## 2012-11-04 DIAGNOSIS — K922 Gastrointestinal hemorrhage, unspecified: Secondary | ICD-10-CM

## 2012-11-04 DIAGNOSIS — K7682 Hepatic encephalopathy: Secondary | ICD-10-CM | POA: Diagnosis present

## 2012-11-04 DIAGNOSIS — E7220 Disorder of urea cycle metabolism, unspecified: Secondary | ICD-10-CM

## 2012-11-04 DIAGNOSIS — K7030 Alcoholic cirrhosis of liver without ascites: Secondary | ICD-10-CM | POA: Diagnosis present

## 2012-11-04 DIAGNOSIS — D696 Thrombocytopenia, unspecified: Secondary | ICD-10-CM

## 2012-11-04 DIAGNOSIS — F1020 Alcohol dependence, uncomplicated: Secondary | ICD-10-CM | POA: Diagnosis present

## 2012-11-04 DIAGNOSIS — F17200 Nicotine dependence, unspecified, uncomplicated: Secondary | ICD-10-CM | POA: Diagnosis present

## 2012-11-04 DIAGNOSIS — R109 Unspecified abdominal pain: Secondary | ICD-10-CM

## 2012-11-04 DIAGNOSIS — K766 Portal hypertension: Secondary | ICD-10-CM | POA: Diagnosis present

## 2012-11-04 DIAGNOSIS — R188 Other ascites: Secondary | ICD-10-CM | POA: Diagnosis present

## 2012-11-04 DIAGNOSIS — K8301 Primary sclerosing cholangitis: Secondary | ICD-10-CM

## 2012-11-04 DIAGNOSIS — I8510 Secondary esophageal varices without bleeding: Secondary | ICD-10-CM | POA: Diagnosis present

## 2012-11-04 DIAGNOSIS — G9340 Encephalopathy, unspecified: Secondary | ICD-10-CM

## 2012-11-04 DIAGNOSIS — D62 Acute posthemorrhagic anemia: Secondary | ICD-10-CM | POA: Diagnosis present

## 2012-11-04 DIAGNOSIS — I8501 Esophageal varices with bleeding: Principal | ICD-10-CM | POA: Diagnosis present

## 2012-11-04 DIAGNOSIS — F411 Generalized anxiety disorder: Secondary | ICD-10-CM | POA: Diagnosis present

## 2012-11-04 DIAGNOSIS — D500 Iron deficiency anemia secondary to blood loss (chronic): Secondary | ICD-10-CM | POA: Diagnosis present

## 2012-11-04 DIAGNOSIS — I8511 Secondary esophageal varices with bleeding: Secondary | ICD-10-CM

## 2012-11-04 DIAGNOSIS — D689 Coagulation defect, unspecified: Secondary | ICD-10-CM

## 2012-11-04 HISTORY — PX: ESOPHAGOGASTRODUODENOSCOPY: SHX5428

## 2012-11-04 LAB — COMPREHENSIVE METABOLIC PANEL WITH GFR
ALT: 20 U/L (ref 0–35)
AST: 72 U/L — ABNORMAL HIGH (ref 0–37)
Albumin: 2.1 g/dL — ABNORMAL LOW (ref 3.5–5.2)
Alkaline Phosphatase: 247 U/L — ABNORMAL HIGH (ref 39–117)
BUN: 15 mg/dL (ref 6–23)
CO2: 24 meq/L (ref 19–32)
Calcium: 8.2 mg/dL — ABNORMAL LOW (ref 8.4–10.5)
Chloride: 99 meq/L (ref 96–112)
Creatinine, Ser: 0.57 mg/dL (ref 0.50–1.10)
GFR calc Af Amer: >90 mL/min
GFR calc non Af Amer: >90 mL/min
Glucose, Bld: 100 mg/dL — ABNORMAL HIGH (ref 70–99)
Potassium: 4.2 meq/L (ref 3.5–5.1)
Sodium: 133 meq/L — ABNORMAL LOW (ref 135–145)
Total Bilirubin: 5.3 mg/dL — ABNORMAL HIGH (ref 0.3–1.2)
Total Protein: 6.6 g/dL (ref 6.0–8.3)

## 2012-11-04 LAB — CBC
HCT: 22.6 % — ABNORMAL LOW (ref 36.0–46.0)
MCHC: 36.4 g/dL — ABNORMAL HIGH (ref 30.0–36.0)
MCV: 91.9 fL (ref 78.0–100.0)
Platelets: 169 10*3/uL (ref 150–400)
RBC: 2.46 MIL/uL — ABNORMAL LOW (ref 3.87–5.11)
RDW: 22.1 % — ABNORMAL HIGH (ref 11.5–15.5)
WBC: 9.5 10*3/uL (ref 4.0–10.5)

## 2012-11-04 LAB — AMMONIA: Ammonia: 176 umol/L — ABNORMAL HIGH (ref 11–60)

## 2012-11-04 LAB — PROTIME-INR
INR: 1.64 — ABNORMAL HIGH (ref 0.00–1.49)
Prothrombin Time: 19 seconds — ABNORMAL HIGH (ref 11.6–15.2)

## 2012-11-04 LAB — MAGNESIUM: Magnesium: 1.5 mg/dL (ref 1.5–2.5)

## 2012-11-04 LAB — ETHANOL: Alcohol, Ethyl (B): 213 mg/dL — ABNORMAL HIGH (ref 0–11)

## 2012-11-04 LAB — OCCULT BLOOD, POC DEVICE: Fecal Occult Bld: POSITIVE — AB

## 2012-11-04 SURGERY — EGD (ESOPHAGOGASTRODUODENOSCOPY)
Anesthesia: Moderate Sedation

## 2012-11-04 MED ORDER — SODIUM CHLORIDE 0.9 % IV SOLN
INTRAVENOUS | Status: DC
  Administered 2012-11-04: 17:00:00 via INTRAVENOUS

## 2012-11-04 MED ORDER — WOMENS ONE DAILY PO TABS: 1.0000 | ORAL_TABLET | Freq: Every day | ORAL | Status: DC

## 2012-11-04 MED ORDER — LORAZEPAM 1 MG PO TABS
1.0000 mg | ORAL_TABLET | Freq: Four times a day (QID) | ORAL | Status: DC | PRN
  Administered 2012-11-06 (×2): 1 mg via ORAL
  Filled 2012-11-04 (×2): qty 1

## 2012-11-04 MED ORDER — SODIUM CHLORIDE 0.9 % IV SOLN
80.0000 mg | Freq: Once | INTRAVENOUS | Status: AC
  Administered 2012-11-04: 11:00:00 80 mg via INTRAVENOUS
  Filled 2012-11-04: qty 80

## 2012-11-04 MED ORDER — TRAMADOL HCL 50 MG PO TABS: 50.0000 mg | ORAL_TABLET | Freq: Four times a day (QID) | ORAL | Status: DC

## 2012-11-04 MED ORDER — LORAZEPAM 2 MG/ML IJ SOLN: 1.0000 mg | Freq: Four times a day (QID) | INTRAMUSCULAR | Status: DC | PRN

## 2012-11-04 MED ORDER — ADULT MULTIVITAMIN W/MINERALS CH
1.0000 | ORAL_TABLET | Freq: Every day | ORAL | Status: DC
  Administered 2012-11-05 – 2012-11-07 (×3): 1 via ORAL
  Filled 2012-11-04 (×3): qty 1

## 2012-11-04 MED ORDER — FOLIC ACID 1 MG PO TABS: 1.0000 mg | ORAL_TABLET | Freq: Every day | ORAL | Status: DC

## 2012-11-04 MED ORDER — SODIUM CHLORIDE 0.9 % IV BOLUS (SEPSIS)
500.0000 mL | Freq: Once | INTRAVENOUS | Status: AC
  Administered 2012-11-04: 500 mL via INTRAVENOUS

## 2012-11-04 MED ORDER — THIAMINE HCL 100 MG/ML IJ SOLN
Freq: Once | INTRAVENOUS | Status: AC
  Administered 2012-11-04: 20:00:00 via INTRAVENOUS
  Filled 2012-11-04: qty 1000

## 2012-11-04 MED ORDER — PANTOPRAZOLE SODIUM 40 MG IV SOLR: 40.0000 mg | Freq: Two times a day (BID) | INTRAVENOUS | Status: DC

## 2012-11-04 MED ORDER — ONDANSETRON HCL 4 MG PO TABS: 4.0000 mg | ORAL_TABLET | Freq: Four times a day (QID) | ORAL | Status: DC | PRN

## 2012-11-04 MED ORDER — FOLIC ACID 5 MG/ML IJ SOLN
1.0000 mg | Freq: Every day | INTRAMUSCULAR | Status: DC
  Filled 2012-11-04: qty 0.2

## 2012-11-04 MED ORDER — SODIUM CHLORIDE 0.9 % IV SOLN
50.0000 ug/h | INTRAVENOUS | Status: DC
  Administered 2012-11-04: 50 ug/h via INTRAVENOUS
  Administered 2012-11-04: 25 ug/h via INTRAVENOUS
  Filled 2012-11-04 (×5): qty 1

## 2012-11-04 MED ORDER — VITAMIN B-1 100 MG PO TABS: 100.0000 mg | ORAL_TABLET | Freq: Every day | ORAL | Status: DC

## 2012-11-04 MED ORDER — THIAMINE HCL 100 MG/ML IJ SOLN: 100.0000 mg | Freq: Every day | INTRAMUSCULAR | Status: DC

## 2012-11-04 MED ORDER — THIAMINE HCL 100 MG/ML IJ SOLN
100.0000 mg | Freq: Every day | INTRAMUSCULAR | Status: DC
  Filled 2012-11-04: qty 1

## 2012-11-04 MED ORDER — ADULT MULTIVITAMIN W/MINERALS CH
1.0000 | ORAL_TABLET | Freq: Every day | ORAL | Status: DC
  Filled 2012-11-04: qty 1

## 2012-11-04 MED ORDER — ACETAMINOPHEN 325 MG PO TABS: 650.0000 mg | ORAL_TABLET | Freq: Four times a day (QID) | ORAL | Status: DC | PRN

## 2012-11-04 MED ORDER — FENTANYL CITRATE 0.05 MG/ML IJ SOLN
INTRAMUSCULAR | Status: AC
  Filled 2012-11-04: qty 4

## 2012-11-04 MED ORDER — FENTANYL CITRATE 0.05 MG/ML IJ SOLN
INTRAMUSCULAR | Status: DC | PRN
  Administered 2012-11-04 (×2): 25 ug via INTRAVENOUS

## 2012-11-04 MED ORDER — ONDANSETRON HCL 4 MG/2ML IJ SOLN
4.0000 mg | Freq: Four times a day (QID) | INTRAMUSCULAR | Status: DC | PRN
  Administered 2012-11-04: 4 mg via INTRAVENOUS
  Filled 2012-11-04: qty 2

## 2012-11-04 MED ORDER — FOLIC ACID 1 MG PO TABS
1.0000 mg | ORAL_TABLET | Freq: Every day | ORAL | Status: DC
  Administered 2012-11-05 – 2012-11-07 (×3): 1 mg via ORAL
  Filled 2012-11-04 (×4): qty 1

## 2012-11-04 MED ORDER — ACETAMINOPHEN 650 MG RE SUPP: 650.0000 mg | Freq: Four times a day (QID) | RECTAL | Status: DC | PRN

## 2012-11-04 MED ORDER — MIDAZOLAM HCL 10 MG/2ML IJ SOLN
INTRAMUSCULAR | Status: DC | PRN
  Administered 2012-11-04 (×2): 2 mg via INTRAVENOUS
  Administered 2012-11-04: 1 mg via INTRAVENOUS
  Administered 2012-11-04: 2 mg via INTRAVENOUS

## 2012-11-04 MED ORDER — BUTAMBEN-TETRACAINE-BENZOCAINE 2-2-14 % EX AERO
INHALATION_SPRAY | CUTANEOUS | Status: DC | PRN
  Administered 2012-11-04: 2 via TOPICAL

## 2012-11-04 MED ORDER — DEXTROSE 5 % IV SOLN
2.0000 g | INTRAVENOUS | Status: DC
  Administered 2012-11-04: 2 g via INTRAVENOUS
  Filled 2012-11-04 (×2): qty 2

## 2012-11-04 MED ORDER — OCTREOTIDE LOAD VIA INFUSION
50.0000 ug | Freq: Once | INTRAVENOUS | Status: AC
  Administered 2012-11-04: 50 ug via INTRAVENOUS
  Filled 2012-11-04 (×2): qty 25

## 2012-11-04 MED ORDER — DIPHENHYDRAMINE HCL 50 MG/ML IJ SOLN
INTRAMUSCULAR | Status: AC
  Filled 2012-11-04: qty 1

## 2012-11-04 MED ORDER — DIPHENHYDRAMINE HCL 50 MG/ML IJ SOLN
INTRAMUSCULAR | Status: DC | PRN
  Administered 2012-11-04: 25 mg via INTRAVENOUS

## 2012-11-04 MED ORDER — RIFAXIMIN 550 MG PO TABS
550.0000 mg | ORAL_TABLET | Freq: Two times a day (BID) | ORAL | Status: DC
  Administered 2012-11-05 – 2012-11-07 (×5): 550 mg via ORAL
  Filled 2012-11-04 (×7): qty 1

## 2012-11-04 MED ORDER — LACTULOSE 10 GM/15ML PO SOLN
20.0000 g | Freq: Two times a day (BID) | ORAL | Status: DC
  Administered 2012-11-05 – 2012-11-07 (×5): 20 g via ORAL
  Filled 2012-11-04 (×7): qty 30

## 2012-11-04 MED ORDER — MIDAZOLAM HCL 5 MG/ML IJ SOLN
INTRAMUSCULAR | Status: AC
  Filled 2012-11-04: qty 3

## 2012-11-04 MED ORDER — SODIUM CHLORIDE 0.9 % IJ SOLN
3.0000 mL | Freq: Two times a day (BID) | INTRAMUSCULAR | Status: DC
  Administered 2012-11-05 – 2012-11-07 (×3): 3 mL via INTRAVENOUS

## 2012-11-04 MED ORDER — SODIUM CHLORIDE 0.9 % IV SOLN
8.0000 mg/h | INTRAVENOUS | Status: DC
  Administered 2012-11-04 (×2): 8 mg/h via INTRAVENOUS
  Filled 2012-11-04 (×6): qty 80

## 2012-11-04 MED ORDER — VITAMIN B-1 100 MG PO TABS
100.0000 mg | ORAL_TABLET | Freq: Every day | ORAL | Status: DC
  Administered 2012-11-05 – 2012-11-07 (×3): 100 mg via ORAL
  Filled 2012-11-04 (×4): qty 1

## 2012-11-04 NOTE — Consult Note (Signed)
[facility] Consult Note  Referring Provider: No ref. provider found Primary Care Physician:  Gabriel Cirri, DO Primary Gastroenterologist:  Dr.  Antony Contras Complaint: Vomiting blood  HPI: [name] is an 42 y.o. white female  who presents with hematemesis beginning this morning. She has sclerosing cholangitis and history of alcohol use and known esophageal varices last banded in August of this year. She was doing relatively well until sudden onset of hematemesis today.  Past Medical History  Diagnosis Date  . Sclerosing cholangitis   . Panic attacks   . Anxiety   . Alcohol abuse   . Ascites   . Cirrhosis of liver   . EtOH dependence 08/31/2012  . Family history of anesthesia complication     " my son had problems "    Past Surgical History  Procedure Laterality Date  . None    . Ercp    . Esophagogastroduodenoscopy N/A 09/10/2012    Procedure: ESOPHAGOGASTRODUODENOSCOPY (EGD);  Surgeon: Florencia Reasons, MD;  Location: [facility] ENDOSCOPY;  Service: Endoscopy;  Laterality: N/A;    Medications Prior to Admission  Medication Sig Dispense Refill  . lactulose (CHRONULAC) 10 GM/15ML solution Take 20 g by mouth 2 (two) times daily.      . Multiple Vitamins-Minerals (WOMENS ONE DAILY) TABS Take 1 tablet by mouth daily.      . pantoprazole (PROTONIX) 40 MG tablet Take 40 mg by mouth 2 (two) times daily.      . potassium chloride 20 MEQ/15ML (10%) solution Take 20 mEq by mouth 2 (two) times daily.      . propranolol (INDERAL) 20 MG tablet Take 20 mg by mouth daily.      . rifaximin (XIFAXAN) 550 MG TABS tablet Take 550 mg by mouth 2 (two) times daily.      Marland Kitchen spironolactone (ALDACTONE) 100 MG tablet Take 100 mg by mouth daily.      Marland Kitchen thiamine (VITAMIN B-1) 100 MG tablet Take 100 mg by mouth daily.      . traMADol (ULTRAM) 50 MG tablet Take 50 mg by mouth every 6 (six) hours as needed for pain.      . ursodiol (ACTIGALL) 300 MG capsule Take 300 mg by mouth 2 (two) times daily.      .  folic acid (FOLVITE) 1 MG tablet Take 1 mg by mouth daily.      . furosemide (LASIX) 20 MG tablet Take 20 mg by mouth 2 (two) times daily.        Allergies: No Known Allergies  Family History  Problem Relation Age of Onset  . CAD Father     Social History:  reports that she has been smoking Cigarettes.  She has a 20 pack-year smoking history. She has never used smokeless tobacco. She reports that she drinks alcohol. She reports that she uses illicit drugs (Marijuana).  Review of Systems: negative except as above   Blood pressure 96/44, pulse 92, temperature 98.6 F (37 C), temperature source Oral, resp. rate 11, height 5\' 4"  (1.626 m), weight 64.1 kg (141 lb 5 oz), SpO2 99.00%. Head: Normocephalic, without obvious abnormality, atraumatic Neck: no adenopathy, no carotid bruit, no JVD, supple, symmetrical, trachea midline and thyroid not enlarged, symmetric, no tenderness/mass/nodules Resp: clear to auscultation bilaterally Cardio: regular rate and rhythm, S1, S2 normal, no murmur, click, rub or gallop GI: Jaundiced, abdomen slightly distended no obvious organomegaly Extremities: extremities normal, atraumatic, no cyanosis or edema  Results for orders placed during the hospital encounter of 11/04/12 (  from the past 48 hour(s))  PROTIME-INR     Status: Abnormal   Collection Time    11/04/12 10:31 AM      Result Value Range   Prothrombin Time 19.0 (*) 11.6 - 15.2 seconds   INR 1.64 (*) 0.00 - 1.49  COMPREHENSIVE METABOLIC PANEL     Status: Abnormal   Collection Time    11/04/12 10:31 AM      Result Value Range   Sodium 133 (*) 135 - 145 mEq/L   Potassium 4.2  3.5 - 5.1 mEq/L   Chloride 99  96 - 112 mEq/L   CO2 24  19 - 32 mEq/L   Glucose, Bld 100 (*) 70 - 99 mg/dL   BUN 15  6 - 23 mg/dL   Creatinine, Ser 1.61  0.50 - 1.10 mg/dL   Calcium 8.2 (*) 8.4 - 10.5 mg/dL   Total Protein 6.6  6.0 - 8.3 g/dL   Albumin 2.1 (*) 3.5 - 5.2 g/dL   AST 72 (*) 0 - 37 U/L   ALT 20  0 - 35 U/L    Alkaline Phosphatase 247 (*) 39 - 117 U/L   Total Bilirubin 5.3 (*) 0.3 - 1.2 mg/dL   GFR calc non Af Amer >90  >90 mL/min   GFR calc Af Amer >90  >90 mL/min   Comment: (NOTE)     The eGFR has been calculated using the CKD EPI equation.     This calculation has not been validated in all clinical situations.     eGFR's persistently <90 mL/min signify possible Chronic Kidney     Disease.  CBC     Status: Abnormal   Collection Time    11/04/12 10:31 AM      Result Value Range   WBC 12.7 (*) 4.0 - 10.5 K/uL   RBC 2.46 (*) 3.87 - 5.11 MIL/uL   Hemoglobin 8.0 (*) 12.0 - 15.0 g/dL   HCT 09.6 (*) 04.5 - 40.9 %   MCV 91.9  78.0 - 100.0 fL   MCH 32.5  26.0 - 34.0 pg   MCHC 35.4  30.0 - 36.0 g/dL   RDW 81.1 (*) 91.4 - 78.2 %   Platelets 201  150 - 400 K/uL  ETHANOL     Status: Abnormal   Collection Time    11/04/12 10:31 AM      Result Value Range   Alcohol, Ethyl (B) 213 (*) 0 - 11 mg/dL   Comment:            LOWEST DETECTABLE LIMIT FOR     SERUM ALCOHOL IS 11 mg/dL     FOR MEDICAL PURPOSES ONLY  AMMONIA     Status: Abnormal   Collection Time    11/04/12 10:31 AM      Result Value Range   Ammonia 176 (*) 11 - 60 umol/L  OCCULT BLOOD, POC DEVICE     Status: Abnormal   Collection Time    11/04/12 10:45 AM      Result Value Range   Fecal Occult Bld POSITIVE (*) NEGATIVE  TYPE AND SCREEN     Status: None   Collection Time    11/04/12 11:10 AM      Result Value Range   ABO/RH(D) O NEG     Antibody Screen NEG     Sample Expiration 11/07/2012     Unit Number [id number]     Blood Component Type RBC LR PHER2     Unit division  00     Status of Unit ALLOCATED     Transfusion Status OK TO TRANSFUSE     Crossmatch Result Compatible     Unit Number [id number]     Blood Component Type RBC LR PHER1     Unit division 00     Status of Unit ISSUED     Transfusion Status OK TO TRANSFUSE     Crossmatch Result Compatible    PREPARE RBC (CROSSMATCH)     Status: None   Collection  Time    11/04/12 11:10 AM      Result Value Range   Order Confirmation ORDER PROCESSED BY BLOOD BANK     No results found.  Assessment: Upper GI bleed likely secondary to esophageal varices related to alcohol use and sclerosing cholangitis Plan:  Octreotide, resuscitation, IV Protonix and early endoscopy. Sonia Stickels C 11/04/2012, 5:20 PM

## 2012-11-04 NOTE — ED Provider Notes (Addendum)
CSN: [id number]     Arrival date & time 11/04/12  5784 History   First MD Initiated Contact with Patient 11/04/12 [phone]     Chief Complaint  Patient presents with  . Hematemesis   (Consider location/radiation/quality/duration/timing/severity/associated sxs/prior Treatment) The history is provided by the patient.  pt c/o 2-3 episodes hematemesis this morning. Bright red blood. Moderate amount. Similar to prior upper gi bleed due to esophageal varices, but pt states amount of bleeding not as much. States had started to vomit this morning at 2 am after eating chinese food, thinking she ate too much.  No diarrhea. No abd distension or pain. No faintness or lightheadedness. +etoh abuse this am ?4 beers, hx same, cirrhosis, esophageal varices.  Denies melena, states recent bm normal.        Past Medical History  Diagnosis Date  . Sclerosing cholangitis   . Panic attacks   . Anxiety   . Alcohol abuse   . Ascites   . Cirrhosis of liver   . EtOH dependence 08/31/2012  . Family history of anesthesia complication     " my son had problems "   Past Surgical History  Procedure Laterality Date  . None    . Ercp    . Esophagogastroduodenoscopy N/A 09/10/2012    Procedure: ESOPHAGOGASTRODUODENOSCOPY (EGD);  Surgeon: Florencia Reasons, MD;  [facility]: [facility] ENDOSCOPY;  Service: Endoscopy;  Laterality: N/A;   Family History  Problem Relation Age of Onset  . CAD Father    History  Substance Use Topics  . Smoking status: Current Every Day Smoker -- 1.00 packs/day for 20 years    Types: Cigarettes  . Smokeless tobacco: Never Used  . Alcohol Use: Yes     Comment: Pt drinks vodka/liquor every weekend, denies daily drinking   OB History   Grav Para Term Preterm Abortions TAB SAB Ect Mult Living                 Review of Systems  Constitutional: Negative for fever and chills.  HENT: Negative for nosebleeds and sore throat.   Eyes: Negative for redness.  Respiratory: Negative for shortness of  breath.   Cardiovascular: Negative for chest pain.  Gastrointestinal: Positive for vomiting. Negative for abdominal pain and blood in stool.  Genitourinary: Negative for hematuria and flank pain.  Musculoskeletal: Negative for back pain and neck pain.  Skin: Negative for rash.  Neurological: Negative for syncope and headaches.  Hematological: Does not bruise/bleed easily.  Psychiatric/Behavioral: Negative for confusion.    Allergies  Review of patient's allergies indicates no known allergies.  Home Medications   Current Outpatient Rx  Name  Route  Sig  Dispense  Refill  . feeding supplement (ENSURE) PUDG   Oral   Take 1 Container by mouth 3 (three) times daily between meals.   90 Container   1   . FLUoxetine (PROZAC) 10 MG capsule   Oral   Take 1 capsule (10 mg total) by mouth daily.   30 capsule   0   . folic acid (FOLVITE) 1 MG tablet   Oral   Take 1 tablet (1 mg total) by mouth daily.   30 tablet   0   . furosemide (LASIX) 20 MG tablet   Oral   Take 1 tablet (20 mg total) by mouth 2 (two) times daily.      0   . lactulose (CHRONULAC) 10 GM/15ML solution   Oral   Take 30 mLs (20 g total) by  mouth 2 (two) times daily.      0   . levofloxacin (LEVAQUIN) 500 MG tablet   Oral   Take 1 tablet (500 mg total) by mouth daily.   6 tablet   0   . Multiple Vitamin (MULTIVITAMIN WITH MINERALS) TABS tablet   Oral   Take 1 tablet by mouth daily.   30 tablet   0   . pantoprazole (PROTONIX) 40 MG tablet   Oral   Take 1 tablet (40 mg total) by mouth 2 (two) times daily.   60 tablet   1   . potassium chloride 20 MEQ/15ML (10%) solution   Oral   Take 15 mLs (20 mEq total) by mouth 2 (two) times daily.   500 mL   0   . propranolol (INDERAL) 20 MG tablet   Oral   Take 1 tablet (20 mg total) by mouth daily.   30 tablet   1   . thiamine 100 MG tablet   Oral   Take 1 tablet (100 mg total) by mouth daily.   30 tablet   0   . traMADol (ULTRAM) 50 MG  tablet   Oral   Take 1 tablet (50 mg total) by mouth every 6 (six) hours as needed for pain.   15 tablet   0   . ursodiol (ACTIGALL) 300 MG capsule   Oral   Take 300 mg by mouth 2 (two) times daily.          BP 90/45  Pulse 91  Temp(Src) 98.1 F (36.7 C) (Oral)  Resp 14  Ht 5\' 4"  (1.626 m)  Wt 150 lb (68.04 kg)  BMI 25.73 kg/m2  SpO2 97% Physical Exam  Nursing note and vitals reviewed. Constitutional: She appears well-developed and well-nourished. No distress.  HENT:  Mouth/Throat: Oropharynx is clear and moist.  Eyes: Scleral icterus is present.  Neck: Neck supple. No tracheal deviation present.  Cardiovascular: Normal rate, regular rhythm, normal heart sounds and intact distal pulses.   Pulmonary/Chest: Effort normal and breath sounds normal. No respiratory distress.  Abdominal: Soft. Normal appearance. She exhibits no distension. There is no tenderness.  Genitourinary:  Hemoccult pending.   Musculoskeletal: She exhibits no edema and no tenderness.  Neurological: She is alert.  Skin: Skin is warm and dry. No rash noted. She is not diaphoretic.  Psychiatric: She has a normal mood and affect.    ED Course  Procedures (including critical care time)  EKG Interpretation   None      Results for orders placed during the hospital encounter of 11/04/12  PROTIME-INR      Result Value Range   Prothrombin Time 19.0 (*) 11.6 - 15.2 seconds   INR 1.64 (*) 0.00 - 1.49  COMPREHENSIVE METABOLIC PANEL      Result Value Range   Sodium 133 (*) 135 - 145 mEq/L   Potassium 4.2  3.5 - 5.1 mEq/L   Chloride 99  96 - 112 mEq/L   CO2 24  19 - 32 mEq/L   Glucose, Bld 100 (*) 70 - 99 mg/dL   BUN 15  6 - 23 mg/dL   Creatinine, Ser 4.09  0.50 - 1.10 mg/dL   Calcium 8.2 (*) 8.4 - 10.5 mg/dL   Total Protein 6.6  6.0 - 8.3 g/dL   Albumin 2.1 (*) 3.5 - 5.2 g/dL   AST 72 (*) 0 - 37 U/L   ALT 20  0 - 35 U/L   Alkaline Phosphatase 247 (*)  39 - 117 U/L   Total Bilirubin 5.3 (*) 0.3 -  1.2 mg/dL   GFR calc non Af Amer >90  >90 mL/min   GFR calc Af Amer >90  >90 mL/min  CBC      Result Value Range   WBC 12.7 (*) 4.0 - 10.5 K/uL   RBC 2.46 (*) 3.87 - 5.11 MIL/uL   Hemoglobin 8.0 (*) 12.0 - 15.0 g/dL   HCT 41.3 (*) 24.4 - 01.0 %   MCV 91.9  78.0 - 100.0 fL   MCH 32.5  26.0 - 34.0 pg   MCHC 35.4  30.0 - 36.0 g/dL   RDW 27.2 (*) 53.6 - 64.4 %   Platelets 201  150 - 400 K/uL  ETHANOL      Result Value Range   Alcohol, Ethyl (B) 213 (*) 0 - 11 mg/dL  AMMONIA      Result Value Range   Ammonia 176 (*) 11 - 60 umol/L  OCCULT BLOOD, POC DEVICE      Result Value Range   Fecal Occult Bld POSITIVE (*) NEGATIVE  TYPE AND SCREEN      Result Value Range   ABO/RH(D) O NEG     Antibody Screen PENDING     Sample Expiration 11/07/2012    PREPARE RBC (CROSSMATCH)      Result Value Range   Order Confirmation ORDER PROCESSED BY BLOOD BANK        MDM  Iv ns bolus.  protonix bolus and gtt.  Monitor. Pulse ox.  Reviewed nursing notes and prior charts for additional history.   Stool weakly heme pos.  hgb sl dec from prior - transfuse 2 units prbc.  Octreotide bolus and gtt.  protonix bolus and gtt.  Additional iv line established.  Recheck abd no change from prior.  Recheck bp marginal. Additional iv, transfusion prbc.  Med service called re admission.  CRITICAL CARE  Re upper gi bleeding, bleeding varices, symptomatic anemia, cirrhosis, hypotension,  Performed by: Suzi Roots Total critical care time: 35 Critical care time was exclusive of separately billable procedures and treating other patients. Critical care was necessary to treat or prevent imminent or life-threatening deterioration. Critical care was time spent personally by me on the following activities: development of treatment plan with patient and/or surrogate as well as nursing, discussions with consultants, evaluation of patient's response to treatment, examination of patient, obtaining history  from patient or surrogate, ordering and performing treatments and interventions, ordering and review of laboratory studies, ordering and review of radiographic studies, pulse oximetry and re-evaluation of patient's condition.  Discussed pt with gi on call for Dr Bosie Clos, Dr Madilyn Fireman, incl hx, current vitals, hgb, recent egd, etc - he will consult.      Suzi Roots, MD 11/04/12 [phone]

## 2012-11-04 NOTE — Progress Notes (Signed)
ANTIBIOTIC CONSULT NOTE - INITIAL  Pharmacy Consult for Rocephin Indication: Hematemesis, GIB- prevention SBP  No Known Allergies  Patient Measurements: Height: 5\' 4"  (162.6 cm) Weight: 141 lb 5 oz (64.1 kg) IBW/kg (Calculated) : 54.7 Adjusted Body Weight:   Vital Signs: Temp: 98 F (36.7 C) (11/02 1605) Temp src: Oral (11/02 1605) BP: 102/47 mmHg (11/02 1605) Pulse Rate: 89 (11/02 1605) Intake/Output from previous day:   Intake/Output from this shift: Total I/O In: 50 [I.V.:50] Out: -   Labs:  Recent Labs  11/04/12 1031  WBC 12.7*  HGB 8.0*  PLT 201  CREATININE 0.57   Estimated Creatinine Clearance: 78.3 ml/min (by C-G formula based on Cr of 0.57). No results found for this basename: VANCOTROUGH, VANCOPEAK, VANCORANDOM, GENTTROUGH, GENTPEAK, GENTRANDOM, TOBRATROUGH, TOBRAPEAK, TOBRARND, AMIKACINPEAK, AMIKACINTROU, AMIKACIN,  in the last 72 hours   Microbiology: No results found for this or any previous visit (from the past 720 hour(s)).  Medical History: Past Medical History  Diagnosis Date  . Sclerosing cholangitis   . Panic attacks   . Anxiety   . Alcohol abuse   . Ascites   . Cirrhosis of liver   . EtOH dependence 08/31/2012  . Family history of anesthesia complication     " my son had problems "    Medications:  Scheduled:  . folic acid  1 mg Oral Daily  . folic acid  1 mg Intravenous Daily  . [START ON 11/05/2012] multivitamin with minerals  1 tablet Oral Daily  . octreotide  50 mcg Intravenous Once  . banana bag IV 1000 mL   Intravenous Once  . sodium chloride  3 mL Intravenous Q12H  . thiamine  100 mg Oral Daily   Or  . thiamine  100 mg Intravenous Daily   Assessment: 42yo female with GIB bleed and hx of varices, to start Rocephin for presumed prevention of SBP.    Goal of Therapy:  prevention of infection  Plan:  Rocephin 2g IV q24  Marisue Humble, PharmD Clinical Pharmacist [facility]

## 2012-11-04 NOTE — ED Notes (Signed)
Contact number Jack Quarto [phone].Marland KitchenMarland Kitchen

## 2012-11-04 NOTE — ED Notes (Signed)
Patient presents to ED via EMS with complaints of vomiting blood for 3 hours.

## 2012-11-04 NOTE — H&P (Signed)
Date: 11/04/2012               Patient Name:  [name] MRN: [id number]  DOB: April 02, 1970 Age / Sex: 42 y.o., female   PCP: Gabriel Cirri, DO         Medical Service: Internal Medicine Teaching Service         Attending Physician: Dr. Kem Kays    First Contact: Dr. Aundria Rud Pager: [phone]  Second Contact: Dr. Shirlee Latch  Pager: [phone]       After Hours (After 5p/  First Contact Pager: [phone]  weekends / holidays): Second Contact Pager: [phone]   Chief Complaint: vomiting blood  History of Present Illness:  Ms. 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 Majority of history provided by her boyfriend Jack Quarto who was at bedside.    Patient was recently admitted in 09/2012 for UGIB with esophageal varices discovered on EGD and had reportedly been sober from alcohol since then until two days ago.  Mr. Melvyn Neth reports that last night 11/1, patient was "acting tipsy" and somnolent, specifically sitting down and putting her head between her knees.  He thinks she was drinking then as well as this morning but notes that she is sneaky and hides her vodka well.  She vomited bright red blood multiple times this morning so Mr. Lewis called EMS.  She had not complained of any pain prior to vomiting, had any changes in personality other than those described the night prior.  Later in interview when patient more alert, she denied abdominal pain, dark or bloody stools, fever/chills, chest pain, or shortness of breath.  She has also had prior hospitalizations with paracentesis for ascites (06/2012 and 08/2012) as well as aforementioned hospitalization last month with esophageal varices were discovered on EGD and banded.  Patient's mother, who is her healthcare POA and lives in [place], was planning on  coming to [place] this Thursday to help the patient gio through outpatient alcohol rehab program in [place], [place] so she could be considered for liver transplantation at [facility].  Patient follows at [facility].   Patient manages her own medications and uses pill box for organization.  She has three children, ages 13, 73, and 74 years old; she lives at home with 44 year old.  Mr. Melvyn Neth states the child is well cared for, he helps out when necessary. Patient does not work and does not have a Health visitor.     Meds: Current Facility-Administered Medications  Medication Dose Route Frequency Provider Last Rate Last Dose  . octreotide (SANDOSTATIN) 2 mcg/mL load via infusion 50 mcg  50 mcg Intravenous Once Suzi Roots, MD       And  . octreotide (SANDOSTATIN) 2 mcg/mL in sodium chloride 0.9 % 250 mL infusion  25-50 mcg/hr Intravenous Continuous Suzi Roots, MD      . pantoprazole (PROTONIX) 80 mg in sodium chloride 0.9 % 250 mL infusion  8 mg/hr Intravenous Continuous Suzi Roots, MD 25 mL/hr at 11/04/12 1130 8 mg/hr at 11/04/12 1130  . [START ON 11/07/2012] pantoprazole (PROTONIX) injection 40 mg  40 mg Intravenous Q12H Suzi Roots, MD       Current Outpatient Prescriptions  Medication Sig Dispense Refill  . FLUoxetine (PROZAC) 10 MG capsule Take 10 mg by  mouth daily.      . folic acid (FOLVITE) 1 MG tablet Take 1 mg by mouth daily.      . furosemide (LASIX) 20 MG tablet Take 20 mg by mouth 2 (two) times daily.      Marland Kitchen lactulose (CHRONULAC) 10 GM/15ML solution Take 20 g by mouth 2 (two) times daily.      . Multiple Vitamins-Minerals (WOMENS ONE DAILY) TABS Take 1 tablet by mouth daily.      . pantoprazole (PROTONIX) 40 MG tablet Take 40 mg by mouth 2 (two) times daily.      . potassium chloride 20 MEQ/15ML (10%) solution Take 20 mEq by mouth 2 (two) times daily.      . propranolol (INDERAL) 20 MG tablet Take 20 mg by mouth daily.      . rifaximin (XIFAXAN) 550 MG TABS tablet Take 550 mg by mouth  2 (two) times daily.      Marland Kitchen spironolactone (ALDACTONE) 100 MG tablet Take 100 mg by mouth daily.      Marland Kitchen thiamine (VITAMIN B-1) 100 MG tablet Take 100 mg by mouth daily.      . traMADol (ULTRAM) 50 MG tablet Take 50 mg by mouth every 6 (six) hours as needed for pain.      . ursodiol (ACTIGALL) 300 MG capsule Take 300 mg by mouth 2 (two) times daily.        Allergies: Allergies as of 11/04/2012  . (No Known Allergies)   Past Medical History  Diagnosis Date  . Sclerosing cholangitis   . Panic attacks   . Anxiety   . Alcohol abuse   . Ascites   . Cirrhosis of liver   . EtOH dependence 08/31/2012  . Family history of anesthesia complication     " my son had problems "   Past Surgical History  Procedure Laterality Date  . None    . Ercp    . Esophagogastroduodenoscopy N/A 09/10/2012    Procedure: ESOPHAGOGASTRODUODENOSCOPY (EGD);  Surgeon: Florencia Reasons, MD;  Location: [facility] ENDOSCOPY;  Service: Endoscopy;  Laterality: N/A;   Family History  Problem Relation Age of Onset  . CAD Father    History   Social History  . Marital Status: Single    Spouse Name: N/A    Number of Children: N/A  . Years of Education: N/A   Occupational History  . Not on file.   Social History Main Topics  . Smoking status: Current Every Day Smoker -- 1.00 packs/day for 20 years    Types: Cigarettes  . Smokeless tobacco: Never Used  . Alcohol Use: Yes     Comment: Pt drinks vodka/liquor every weekend, denies daily drinking  . Drug Use: Yes    Special: Marijuana  . Sexual Activity: Yes    Birth Control/ Protection: IUD   Other Topics Concern  . Not on file   Social History Narrative  . No narrative on file    Review of Systems: Review of Systems  Constitutional: Negative for fever and chills.  Respiratory: Negative for cough and shortness of breath.   Cardiovascular: Negative for chest pain.  Gastrointestinal: Positive for nausea and vomiting. Negative for abdominal pain, diarrhea,  constipation, blood in stool and melena.  Genitourinary: Negative for dysuria.  Musculoskeletal: Negative for falls.  Neurological: Positive for weakness and headaches. Negative for dizziness and loss of consciousness.    Physical Exam: Blood pressure 103/41, pulse 99, temperature 98.1 F (36.7 C), temperature source Oral, resp.  rate 13, height 5\' 4"  (1.626 m), weight 150 lb (68.04 kg), SpO2 100.00%. General: patient somnolent, only withdrawing to pain at beginning of interview then became more alert and cooperative HEENT: pupils equal round and reactive to light, vision grossly intact, oropharynx clear and non-erythematous  Neck: supple, no lymphadenopathy Lungs: clear to ascultation bilaterally, normal work of respiration, no wheezes, rales, ronchi Heart: regular rate and rhythm, no murmurs, gallops, or rubs Abdomen: soft, non-tender, non-distended, +bowel sounds Extremities: 1-2+ pitting edema to mid-shins bilaterally, 2+ DP/PT pulses bilaterally, no cyanosis, clubbing Neurologic: alert & oriented X3, cranial nerves II-XII intact, strength grossly intact, sensation intact to light touch  Lab results: Basic Metabolic Panel:  Recent Labs  78/29/56 1031  NA 133*  K 4.2  CL 99  CO2 24  GLUCOSE 100*  BUN 15  CREATININE 0.57  CALCIUM 8.2*   Liver Function Tests:  Recent Labs  11/04/12 1031  AST 72*  ALT 20  ALKPHOS 247*  BILITOT 5.3*  PROT 6.6  ALBUMIN 2.1*    Recent Labs  11/04/12 1031  AMMONIA 176*   CBC:  Recent Labs  11/04/12 1031  WBC 12.7*  HGB 8.0*  HCT 22.6*  MCV 91.9  PLT 201   Coagulation:  Recent Labs  11/04/12 1031  LABPROT 19.0*  INR 1.64*   Alcohol Level:  Recent Labs  11/04/12 1031  ETH 213*    Other results: EKG: sinus rhythm, normal axis, prolonged QT interval (515), no LVH, no ST or T wave changes  Assessment & Plan by Problem: # Upper GI bleed- Patient has history of alcoholic cirrhosis with ascites and esophageal  varices s/p banding last month. Need urgent EGD to rule out variceal bleed; no NG tube so as to avoid variceal perforation.  Also possible are gastric or duodenal ulcers though less likely given lack of abdominal pain.  Patient is currently mildly tachycardic (90s-low 100s) with  blood pressure 90s-110s systolic, suggesting acute volume loss.  Hgb 8.0 today. Type and screen done and 2 units of blood ordered by ED, one administered then discontinued.  NS 500 cc bolus, IV protonix bolus of 80 mg IV also administered in ED.  -admit to IMTS step down  -[facility] (Dr. Madilyn Fireman) consulted, appreciate recs -NPO for EGD  -maintain IV access (2 large bore IVs)  -NS @ 100 cc/hr  -start IV pantoprazole 25 cc/hr infusion -start octreotide 50 cbg bolus followed by 25 cc/hr infusion -IV Rocephin 2 g qd for SBP PPX -Zofran IV prn nausea; EKG in AM to monitor QT prolongation  -monitor closely and follow CBC q8h, transfuse for Hgb < 7  -INR, BMP, Mg in AM    #Cirrhosis and esophageal varices 2/2 to alcohol abuse with history of primary sclerosing colangitis- small to moderate sized varices in distal esophagus s/p banding on 09/10/12. Patient does not have signs of SBP (no abdominal pain, fever or chills), no signs of hepatic encephalopathy (patient is oriented x3) but will place her on SBP ppx given risk factors per above.   #Alcochol abuse- drinking this AM, EtOH level on admission 213.  Patient has done outpatient rehab in past per boyfriend.  She was planning to pursue further outpatient rehab starting this weeks in hopes of becoming eligible for liver transplant.  -banana bag followed by NS @ 100 cc/hr -CiWa protocol -social work consult  #Tobacco abuse- 1PPD x many years per boyfriend -smoking cessation counseling -nicotine patch if pt interested  #F/E/N  -NS: IV 100  cc/hr  -Electrolytes: f/u BMP  -NPO until EGD  # DVT PPX- SCDs only given UGIB  #Code status- Full code   Dispo: Disposition is  deferred at this time, awaiting improvement of current medical problems. Anticipated discharge in approximately 3-4 day(s).   The patient does have a current PCP Gabriel Cirri, DO) and does need an [facility] hospital follow-up appointment after discharge.   Signed: Rocco Serene, MD 11/04/2012, 12:48 PM

## 2012-11-04 NOTE — Op Note (Signed)
Procedure: Endoscopy with banding  Medication: 50 mcg fentanyl, 7 mg Versed, 50 mg Benadryl  The patient was placed in the left lateral decubitus position and placed on the pulse monitor with continuous low-flow oxygen delivered by nasal cannula. The esophagus was intubated and near the GE junction was seen a clean varix with a protuberant vessel with no blood. The stomach was entered and there is no blood or food seen in the stomach. There is a Gen. appearance of portal gastropathy but no all were fresh blood seen. The pylorus was nondeformed within the duodenum was entered in both bulb and second portion appeared to be within normal limits.  The scope was withdrawn and the banding apparatus applied. 7 bands were placed on the distal esophageal varices including one over the area of the visible vessel. The first one misfired and cause bleeding. On the second attempt there appeared be cessation of bleeding and 5 more bands were placed but one of them was misfired as well. At the end of the procedure the bleeding seemed to have stopped.  Impression esophageal varices with stigmata of hemorrhage status post banding  Plan: Continue octreotide, Protonix and blood products as needed.

## 2012-11-05 LAB — BASIC METABOLIC PANEL
BUN: 17 mg/dL (ref 6–23)
Calcium: 8.1 mg/dL — ABNORMAL LOW (ref 8.4–10.5)
Chloride: 103 mEq/L (ref 96–112)
Creatinine, Ser: 0.5 mg/dL (ref 0.50–1.10)
GFR calc Af Amer: >90 mL/min (ref >90)
GFR calc non Af Amer: >90 mL/min (ref >90)

## 2012-11-05 LAB — CBC
HCT: 22.2 % — ABNORMAL LOW (ref 36.0–46.0)
HCT: 21.6 % — ABNORMAL LOW (ref 36.0–46.0)
Hemoglobin: 7.7 g/dL — ABNORMAL LOW (ref 12.0–15.0)
Hemoglobin: 7.9 g/dL — ABNORMAL LOW (ref 12.0–15.0)
Hemoglobin: 8.4 g/dL — ABNORMAL LOW (ref 12.0–15.0)
MCH: 32.1 pg (ref 26.0–34.0)
MCHC: 35.6 g/dL (ref 30.0–36.0)
MCV: 90.2 fL (ref 78.0–100.0)
MCV: 91.9 fL (ref 78.0–100.0)
Platelets: 166 10*3/uL (ref 150–400)
Platelets: 168 10*3/uL (ref 150–400)
RBC: 2.35 MIL/uL — ABNORMAL LOW (ref 3.87–5.11)
RBC: 2.55 MIL/uL — ABNORMAL LOW (ref 3.87–5.11)
RDW: 23.2 % — ABNORMAL HIGH (ref 11.5–15.5)
WBC: 10.3 10*3/uL (ref 4.0–10.5)
WBC: 8.9 10*3/uL (ref 4.0–10.5)
WBC: 9.8 10*3/uL (ref 4.0–10.5)

## 2012-11-05 LAB — PROTIME-INR: Prothrombin Time: 19.8 seconds — ABNORMAL HIGH (ref 11.6–15.2)

## 2012-11-05 MED ORDER — DEXTROSE 5 % IV SOLN
2.0000 g | INTRAVENOUS | Status: DC
  Filled 2012-11-05: qty 2

## 2012-11-05 MED ORDER — DEXTROSE 5 % IV SOLN
2.0000 g | INTRAVENOUS | Status: AC
  Administered 2012-11-05 – 2012-11-06 (×2): 2 g via INTRAVENOUS
  Filled 2012-11-05 (×2): qty 2

## 2012-11-05 MED ORDER — TRAMADOL HCL 50 MG PO TABS
50.0000 mg | ORAL_TABLET | Freq: Four times a day (QID) | ORAL | Status: DC | PRN
  Administered 2012-11-05 – 2012-11-07 (×6): 50 mg via ORAL
  Filled 2012-11-05 (×6): qty 1

## 2012-11-05 MED ORDER — SODIUM CHLORIDE 0.9 % IV SOLN
50.0000 ug/h | INTRAVENOUS | Status: AC
  Administered 2012-11-05 – 2012-11-06 (×2): 50 ug/h via INTRAVENOUS
  Filled 2012-11-05 (×7): qty 1

## 2012-11-05 MED ORDER — PANTOPRAZOLE SODIUM 40 MG PO TBEC
40.0000 mg | DELAYED_RELEASE_TABLET | Freq: Two times a day (BID) | ORAL | Status: DC
  Administered 2012-11-05 – 2012-11-07 (×5): 40 mg via ORAL
  Filled 2012-11-05 (×5): qty 1

## 2012-11-05 NOTE — Progress Notes (Signed)
Subjective: Patient sleepy but arousable this morning; oriented x 3.  No new complaints, no abdominal pain or dark/bloody stools.  Objective: Vital signs in last 24 hours: Filed Vitals:   11/05/12 0100 11/05/12 0200 11/05/12 0451 11/05/12 0800  BP: 103/51 113/55 124/64 115/73  Pulse: 94 98 102 96  Temp:   98 F (36.7 C) 99.2 F (37.3 C)  TempSrc:   Oral Oral  Resp: 10 11 11 11   Height:      Weight:   146 lb 9.7 oz (66.5 kg)   SpO2: 100% 100% 100% 96%   Weight change:   Intake/Output Summary (Last 24 hours) at 11/05/12 1130 Last data filed at 11/05/12 1000  Gross per 24 hour  Intake   2785 ml  Output   1275 ml  Net   1510 ml   General: alert, cooperative, and in no apparent distress HEENT: sclerae icteric   Neck: supple, no lymphadenopathy Lungs: clear to ascultation bilaterally, normal work of respiration, no wheezes, rales, ronchi Heart: regular rate and rhythm, no murmurs, gallops, or rubs Abdomen: somewhat distended (pt states this is her baseline) without appreciable fluid wave, soft, non-tender, normal bowel sounds Extremities: 2+ DP/PT pulses bilaterally, no cyanosis, clubbing, or edema Neurologic: alert & oriented X3, cranial nerves II-XII intact, strength grossly intact, sensation intact to light touch  Lab Results: Basic Metabolic Panel:  Recent Labs Lab 11/04/12 1031 11/04/12 1934 11/05/12 0028  NA 133*  --  136  K 4.2  --  4.2  CL 99  --  103  CO2 24  --  24  GLUCOSE 100*  --  95  BUN 15  --  17  CREATININE 0.57  --  0.50  CALCIUM 8.2*  --  8.1*  MG  --  1.5  --    Liver Function Tests:  Recent Labs Lab 11/04/12 1031  AST 72*  ALT 20  ALKPHOS 247*  BILITOT 5.3*  PROT 6.6  ALBUMIN 2.1*    Recent Labs Lab 11/04/12 1031  AMMONIA 176*   CBC:  Recent Labs Lab 11/05/12 0028 11/05/12 0730  WBC 8.9 10.3  HGB 8.4* 7.9*  HCT 22.8* 22.2*  MCV 89.4 90.2  PLT 168 166   Coagulation:  Recent Labs Lab 11/04/12 1031 11/05/12 0028    LABPROT 19.0* 19.8*  INR 1.64* 1.74*   Urine Drug Screen: Drugs of Abuse     Component Value Date/Time   LABOPIA NONE DETECTED 08/31/2012 0500   COCAINSCRNUR NONE DETECTED 08/31/2012 0500   LABBENZ NONE DETECTED 08/31/2012 0500   AMPHETMU NONE DETECTED 08/31/2012 0500   THCU NONE DETECTED 08/31/2012 0500   LABBARB NONE DETECTED 08/31/2012 0500    Alcohol Level:  Recent Labs Lab 11/04/12 1031  ETH 213*    Micro Results: Recent Results (from the past 240 hour(s))  MRSA PCR SCREENING     Status: None   Collection Time    11/04/12  4:10 PM      Result Value Range Status   MRSA by PCR NEGATIVE  NEGATIVE Final   Comment:            The GeneXpert MRSA Assay (FDA     approved for NASAL specimens     only), is one component of a     comprehensive MRSA colonization     surveillance program. It is not     intended to diagnose MRSA     infection nor to guide or     monitor  treatment for     MRSA infections.   Medications: I have reviewed the patient's current medications. Scheduled Meds: . cefTRIAXone (ROCEPHIN)  IV  2 g Intravenous Q24H  . folic acid  1 mg Oral Daily  . lactulose  20 g Oral BID  . multivitamin with minerals  1 tablet Oral Daily  . pantoprazole  40 mg Oral BID  . rifaximin  550 mg Oral BID  . sodium chloride  3 mL Intravenous Q12H  . thiamine  100 mg Oral Daily   Continuous Infusions: . sodium chloride 100 mL/hr at 11/05/12 1000  . 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 11/2 EGD with portal gastropathy, esophageal varix with protuberant vessel now s/p banding x 7; no evidence of active bleeding. Afebrile, VSS. 11/3 Hgb 7.9, INR 1.74.  -[facility] (Dr. Madilyn Fireman) consulted, appreciate recs  -clear liquid diet  -NS @ 100 cc/hr   -convert to oral pantoprazole 40 mg BID  -start octreotide 50 mcg/hr infusion x 72 hours total (ending 11/4) -IV Rocephin 2 g qd x 3 days total for SBP PPX  -Zofran IV prn nausea (no evidence of further QT prolongation on repeat EKG) -CBCs BID, transfuse for Hgb < 7  #Cirrhosis and esophageal varices 2/2 to alcohol abuse with history of primary sclerosing colangitis- small to moderate sized varices in distal esophagus s/p banding on 09/10/12; varix with protuberant vessel at GE junction seen on 11/2 EGD. Patient does not have signs of SBP (no abdominal pain, fever or chills), no signs of hepatic encephalopathy (patient is oriented x3) but on SBP ppx per above given risk factors.   #Alcochol abuse- EtOH level on admission 213. Patient has done outpatient rehab in past per boyfriend. She was planning to pursue further outpatient rehab starting this weeks in hopes of becoming eligible for liver transplant.  -CiWa protocol; pt has not required any lorazepam thus far -thiamine, folic acid, mv supplementation  -social work consult   #Tobacco abuse- 1PPD x many years per boyfriend  -smoking cessation counseling  -nicotine patch if pt interested   #F/E/N  -NS: IV 100 cc/hr  -clear liquid diet, will advance as able  # DVT PPX- SCDs only given UGIB   #Code status- Full code   Dispo: Disposition is deferred at this time, awaiting improvement of current medical problems.  Anticipated discharge in approximately 1-2 day(s).   The patient does have a current PCP Gabriel Cirri, DO) and does need an [facility] hospital follow-up appointment after discharge.   .Services Needed at time of discharge: Y = Yes, Blank = No PT:   OT:   RN:   Equipment:   Other:     LOS: 1 day   Rocco Serene, MD 11/05/2012, 11:30 AM

## 2012-11-05 NOTE — Progress Notes (Signed)
[facility] Progress Note  Subjective: No new complaints, sleeping and difficult to arouse  Objective: Vital signs in last 24 hours: Temp:  [98 F (36.7 C)-98.6 F (37 C)] 98 F (36.7 C) (11/03 0451) Pulse Rate:  [89-104] 102 (11/03 0451) Resp:  [10-24] 11 (11/03 0451) BP: (90-124)/(37-78) 124/64 mmHg (11/03 0451) SpO2:  [96 %-100 %] 100 % (11/03 0451) Weight:  [64.1 kg (141 lb 5 oz)-68.04 kg (150 lb)] 66.5 kg (146 lb 9.7 oz) (11/03 0451) Weight change:    PE: Abdomen moderately distended soft  Lab Results: Results for orders placed during the hospital encounter of 11/04/12 (from the past 24 hour(s))  PROTIME-INR     Status: Abnormal   Collection Time    11/04/12 10:31 AM      Result Value Range   Prothrombin Time 19.0 (*) 11.6 - 15.2 seconds   INR 1.64 (*) 0.00 - 1.49  COMPREHENSIVE METABOLIC PANEL     Status: Abnormal   Collection Time    11/04/12 10:31 AM      Result Value Range   Sodium 133 (*) 135 - 145 mEq/L   Potassium 4.2  3.5 - 5.1 mEq/L   Chloride 99  96 - 112 mEq/L   CO2 24  19 - 32 mEq/L   Glucose, Bld 100 (*) 70 - 99 mg/dL   BUN 15  6 - 23 mg/dL   Creatinine, Ser 1.91  0.50 - 1.10 mg/dL   Calcium 8.2 (*) 8.4 - 10.5 mg/dL   Total Protein 6.6  6.0 - 8.3 g/dL   Albumin 2.1 (*) 3.5 - 5.2 g/dL   AST 72 (*) 0 - 37 U/L   ALT 20  0 - 35 U/L   Alkaline Phosphatase 247 (*) 39 - 117 U/L   Total Bilirubin 5.3 (*) 0.3 - 1.2 mg/dL   GFR calc non Af Amer >90  >90 mL/min   GFR calc Af Amer >90  >90 mL/min  CBC     Status: Abnormal   Collection Time    11/04/12 10:31 AM      Result Value Range   WBC 12.7 (*) 4.0 - 10.5 K/uL   RBC 2.46 (*) 3.87 - 5.11 MIL/uL   Hemoglobin 8.0 (*) 12.0 - 15.0 g/dL   HCT 47.8 (*) 29.5 - 62.1 %   MCV 91.9  78.0 - 100.0 fL   MCH 32.5  26.0 - 34.0 pg   MCHC 35.4  30.0 - 36.0 g/dL   RDW 30.8 (*) 65.7 - 84.6 %   Platelets 201  150 - 400 K/uL  ETHANOL     Status: Abnormal   Collection Time    11/04/12 10:31 AM      Result  Value Range   Alcohol, Ethyl (B) 213 (*) 0 - 11 mg/dL  AMMONIA     Status: Abnormal   Collection Time    11/04/12 10:31 AM      Result Value Range   Ammonia 176 (*) 11 - 60 umol/L  OCCULT BLOOD, POC DEVICE     Status: Abnormal   Collection Time    11/04/12 10:45 AM      Result Value Range   Fecal Occult Bld POSITIVE (*) NEGATIVE  TYPE AND SCREEN     Status: None   Collection Time    11/04/12 11:10 AM      Result Value Range   ABO/RH(D) O NEG     Antibody Screen NEG     Sample Expiration 11/07/2012  Unit Number [id number]     Blood Component Type RBC LR PHER2     Unit division 00     Status of Unit ALLOCATED     Transfusion Status OK TO TRANSFUSE     Crossmatch Result Compatible     Unit Number [id number]     Blood Component Type RBC LR PHER1     Unit division 00     Status of Unit ISSUED     Transfusion Status OK TO TRANSFUSE     Crossmatch Result Compatible    PREPARE RBC (CROSSMATCH)     Status: None   Collection Time    11/04/12 11:10 AM      Result Value Range   Order Confirmation ORDER PROCESSED BY BLOOD BANK    MRSA PCR SCREENING     Status: None   Collection Time    11/04/12  4:10 PM      Result Value Range   MRSA by PCR NEGATIVE  NEGATIVE  MAGNESIUM     Status: None   Collection Time    11/04/12  7:34 PM      Result Value Range   Magnesium 1.5  1.5 - 2.5 mg/dL  CBC     Status: Abnormal   Collection Time    11/04/12  7:34 PM      Result Value Range   WBC 9.5  4.0 - 10.5 K/uL   RBC 2.46 (*) 3.87 - 5.11 MIL/uL   Hemoglobin 8.0 (*) 12.0 - 15.0 g/dL   HCT 30.8 (*) 65.7 - 84.6 %   MCV 89.4  78.0 - 100.0 fL   MCH 32.5  26.0 - 34.0 pg   MCHC 36.4 (*) 30.0 - 36.0 g/dL   RDW 96.2 (*) 95.2 - 84.1 %   Platelets 169  150 - 400 K/uL  CBC     Status: Abnormal   Collection Time    11/05/12 12:28 AM      Result Value Range   WBC 8.9  4.0 - 10.5 K/uL   RBC 2.55 (*) 3.87 - 5.11 MIL/uL   Hemoglobin 8.4 (*) 12.0 - 15.0 g/dL   HCT 32.4 (*) 40.1 - 02.7 %    MCV 89.4  78.0 - 100.0 fL   MCH 32.9  26.0 - 34.0 pg   MCHC 36.8 (*) 30.0 - 36.0 g/dL   RDW 25.3 (*) 66.4 - 40.3 %   Platelets 168  150 - 400 K/uL  BASIC METABOLIC PANEL     Status: Abnormal   Collection Time    11/05/12 12:28 AM      Result Value Range   Sodium 136  135 - 145 mEq/L   Potassium 4.2  3.5 - 5.1 mEq/L   Chloride 103  96 - 112 mEq/L   CO2 24  19 - 32 mEq/L   Glucose, Bld 95  70 - 99 mg/dL   BUN 17  6 - 23 mg/dL   Creatinine, Ser 4.74  0.50 - 1.10 mg/dL   Calcium 8.1 (*) 8.4 - 10.5 mg/dL   GFR calc non Af Amer >90  >90 mL/min   GFR calc Af Amer >90  >90 mL/min  PROTIME-INR     Status: Abnormal   Collection Time    11/05/12 12:28 AM      Result Value Range   Prothrombin Time 19.8 (*) 11.6 - 15.2 seconds   INR 1.74 (*) 0.00 - 1.49    Studies/Results: No results found.    Assessment:  GI bleeding secondary to esophageal varices, status post banding.  Plan: Continue octreotide for 72 hours. Observe for further evidence of bleeding.    Veronda Gabor C 11/05/2012, 6:32 AM

## 2012-11-05 NOTE — Progress Notes (Signed)
CSW received consult for Pt Rehab. Pt with chronic ETOH abuse.   CSW attempted to assess Pt. Pt stated that she would like to go to [facility] in [place], however Pt not willing to speak with CSW at this time.    CSW will continue to follow Pt for d/c planning.   Leron Croak  MSW, LCSWA  [facility]

## 2012-11-05 NOTE — Progress Notes (Signed)
  Date: 11/05/2012  Patient name: [name]  Medical record number: [id number]  Date of birth: 1970/01/14   This patient has been seen and the plan of care was discussed with the house staff. Please see their note for complete details. I concur with their findings with the following additions/corrections: Drowsy but oriented this morning. She has been hemodynamically stable.  She is s/p banding for esophageal varices. Follow GI recs, continue octreotide for 72 hours total. Watch H/H twice daily. Discuss length of therapy with Rocephin. Advance diet if ok with GI. Switch to oral PPI. SW consult, she is still using EtOH. May move out of stepdown to regular floor on telemetry.  Jonah Blue, DO, FACP Faculty [facility] Internal Medicine Residency Program 11/05/2012, 10:47 AM

## 2012-11-05 NOTE — H&P (Signed)
INTERNAL MEDICINE TEACHING SERVICE Attending Admission Note  Date: 11/05/2012  Patient name: [name]  Medical record number: [id number]  Date of birth: [birth year]    I have seen and evaluated [name] and discussed their care with the Residency Team.  42 year old female with pmhx significant for alcohol abuse, cirrhosis, esophageal varices, PSC, presented with hematemesis.  She was is s/p EGD with banding by [facility]. Drowsy but oriented this morning. She has been hemodynamically stable. She is s/p banding for esophageal varices. Follow GI recs, continue octreotide for 72 hours total. Watch H/H twice daily. Discuss length of therapy with Rocephin. Advance diet if ok with GI. Switch to oral PPI. SW consult, she is still using EtOH. May move out of stepdown to regular floor on telemetry.  Jonah Blue, DO, FACP Faculty [facility] Internal Medicine Residency Program 11/05/2012, 2:27 PM

## 2012-11-05 NOTE — Progress Notes (Signed)
Utilization Review Completed.[name] T11/03/2012  

## 2012-11-05 NOTE — Progress Notes (Signed)
Pt asleep but arouses easily. In no acute distress. Unable to wake up completely to ask questions. VSS.

## 2012-11-05 NOTE — Progress Notes (Signed)
Report called to receiving RN on 5W.  Pt transferred to 5W12 via wheelchair with all belongings. Pt's significant other accompanied pt to new room.   Roselie Awkward, RN

## 2012-11-05 NOTE — Progress Notes (Signed)
  I have seen and examined the patient, and reviewed the daily progress note by Isaiah Serge, MS3 and discussed the care of the patient with them. Please see my progress note from 11/05/2012 for further details regarding assessment and plan.    Signed:  Rocco Serene, MD 11/05/2012, 9:38 PM

## 2012-11-05 NOTE — Progress Notes (Signed)
Trystan Eads [id number] Admission Data: 11/05/2012 4:50 PM Attending Provider: Jonah Blue, DO  XLK:[id number],ZDGUY, DO Consults/ Treatment Team: Treatment Team:  Barrie Folk, MD Isaiah Serge, Med Student  [name] is a 42 y.o. female patient admitted from ED awake, alert  & orientated  X 3,  Full Code, VSS - Blood pressure 115/61, pulse 97, temperature 99.2 F (37.3 C), temperature source Oral, resp. rate 14, height 5\' 4"  (1.626 m), weight 66.5 kg (146 lb 9.7 oz), SpO2 97.00%., Pt. Is on room air. No c/o shortness of breath, no c/o chest pain, no distress noted. Tele placed.   IV site WDL: with a transparent dsg that's clean dry and intact.  Allergies:  No Known Allergies   Past Medical History  Diagnosis Date  . Sclerosing cholangitis   . Panic attacks   . Anxiety   . Alcohol abuse   . Ascites   . Cirrhosis of liver   . EtOH dependence 08/31/2012  . Family history of anesthesia complication     " my son had problems "    History:  obtained from family Tobacco/alcohol: unknown for tobacco alcohol level >213 on 11/04/12.  Pt orientation to unit, room and routine. Information packet given to patient/family and safety video watched.  Admission INP armband ID verified with patient/family, and in place. SR up x 2, fall risk assessment complete with Patient and family verbalizing understanding of risks associated with falls. Pt verbalizes an understanding of how to use the call bell and to call for help before getting out of bed.  Skin, clean-dry- intact without evidence of bruising, or skin tears.   No evidence of skin break down noted on exam.    Will cont to monitor and assist as needed.  Kern Reap, RN 11/05/2012 4:50 PM

## 2012-11-05 NOTE — Progress Notes (Signed)
Subjective: [name] underwent successful banding for esophageal varices with GI yesterday without event. She had no acute events overnight. She has no new complaints and reports no abdominal pain or dark/bloody stools.  Objective: Vital signs in last 24 hours: Filed Vitals:   11/05/12 0200 11/05/12 0451 11/05/12 0800 11/05/12 1200  BP: 113/55 124/64 115/73 115/61  Pulse: 98 102 96 97  Temp:  98 F (36.7 C) 99.2 F (37.3 C)   TempSrc:  Oral Oral   Resp: 11 11 11 14   Height:      Weight:  66.5 kg (146 lb 9.7 oz)    SpO2: 100% 100% 96% 97%   Weight change:   Intake/Output Summary (Last 24 hours) at 11/05/12 1308 Last data filed at 11/05/12 1100  Gross per 24 hour  Intake   2935 ml  Output   1275 ml  Net   1660 ml    Physical Exam General: Somnolent but arousable woman laying in bed in NAD. HEENT: PERRLA, sclera icteric. Pulmonary: Clear to auscultation bilaterally, no increased work of breathing. Cardiovascular: Regular rate and rhythm, S1 and S2 normal, no murmur, rub, or gallop appreciated. Abdomen: Bowel sounds active. Soft, non-tender, somewhat distended but with no fluid wave appreciated, no masses, liver edge palpable 3 cm below costal margin. Extremities: All four extremities warm, well-perfused, without cyanosis or edema. Neurologic: AAOx3. Somnolent but arouses to voice with some discontent.   Lab Results:  Recent Labs Lab 11/04/12 1031 11/05/12 0028  NA 133* 136  K 4.2 4.2  CL 99 103  CO2 24 24  GLUCOSE 100* 95  BUN 15 17  CREATININE 0.57 0.50  CALCIUM 8.2* 8.1*  GFRNONAA >90 >90  GFRAA >90 >90    Recent Labs Lab 11/04/12 1934 11/05/12 0028 11/05/12 0730  HGB 8.0* 8.4* 7.9*  HCT 22.0* 22.8* 22.2*  MCV 89.4 89.4 90.2  WBC 9.5 8.9 10.3  PLT 169 168 166    Recent Labs Lab 11/04/12 1031 11/05/12 0028  LABPROT 19.0* 19.8*  INR 1.64* 1.74*    Micro Results:  MRSA PCR screen 11/22/2012: negative  Studies/Results:   EGD 11/04/2012 (by  Dr. Madilyn Fireman): Impression - esophageal varices with stigmata of hemorrhage status post banding  EKG 11/05/2012 (reviewed with Dr. Shirlee Latch): negative with QT/QTc 402/499 (resolved from prior 11/22/2012 EKG)   EKG 11/04/2012 (reviewed with Dr. Shirlee Latch): negative except for QT prolongation (QT/QTc 401/515)  Medications: I have reviewed the patient's current medications. Scheduled Meds: . cefTRIAXone (ROCEPHIN)  IV  2 g Intravenous Q24H  . folic acid  1 mg Oral Daily  . lactulose  20 g Oral BID  . multivitamin with minerals  1 tablet Oral Daily  . pantoprazole  40 mg Oral BID  . rifaximin  550 mg Oral BID  . sodium chloride  3 mL Intravenous Q12H  . thiamine  100 mg Oral Daily   Continuous Infusions: . sodium chloride 100 mL/hr at 11/05/12 1100  . octreotide (SANDOSTATIN) infusion 50 mcg/hr (11/05/12 1100)   PRN Meds:.LORazepam, LORazepam, ondansetron (ZOFRAN) IV, ondansetron, traMADol   Assessment/Plan: This is a 42 y.o. 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 She underwent subsequent banding of esophageal varices and is currently stable.  #Acute upper GI hemorrhage/esophageal varices s/p banding The patient has a history of alcoholic cirrhosis with prior ascites s/p paracentesis 06/2012 and 08/2012 and esophageal varices s/p banding 09/2012. More  esophageal varices with stigmata of recent bleeding were seen on urgent EGD 11/04/2012, making bleeding esophageal varices the most likely cause of her upper GI bleed. Other major causes of upper GI bleed to be considered are gastrointestinal ulcers, tumors, or Mallory-Weiss tears, but none of these were seen on EGD. Additionally, the patient's history of cirrhosis, PSC, ascites, and esophageal varices is strongly supportive of portal hypertension with bleeding esophageal varices as the cause of her upper GI bleed. Typical  management of an upper GI bleed from esophageal varices includes PPI to promote clot formation, antibiotics to prophylax for SBP, octreotide to decrease portal blood flow, and resuscitation with IVFs and transfusions if needed (see below for details r.e. transfusion). She has no symptoms currently of SBP (abdominal pain, fever, chills). She is currently doing well with stable vitals - Appreciate GI consult recommendations (Dr. Madilyn Fireman with [facility]). - Change from IV Protonix to 40 mg po bid. - Rocephin 2 g IV q24h. GI recs to continue for a total of 3-5 days, so we will continue for 3 days (last dose 11/06/2012) and give 2 days of an appropriate po antibiotic (UpToDate recommends norfloxacin 400 mg bid). - Octreotide infusion 50 mcg/hr IV. GI recs to continue for a total of 3 days, so last day will be 11/06/2012. - Zofran 4 mg IV or po prn. Has  - NS @ 100 mL/hr for resuscitation.  - Clear liquid diet.  #Acute blood loss anemia The patient was hypotensive (90-110s systolic) and mildly tachycardic (90s-100s) on admission, suggesting volume loss consistent with her EGD findings of esophageal varices with stigmata of recent bleeding (but no active bleeding). She also has a normocytic anemia likely secondary to blood loss from esophageal varices. Her hemoglobin is currently stable (8.0 --> 8.4 --> 7.9) and she has no signs of active bleeding; her blood pressure is wnl and she remains mildly tachycardic in the low 100s. She received 1 U PRBCs in the ED yesterday. A transfusion threshold of 9.0 can be used in patients with significant comorbidities such as CAD, which the patient does not have; more aggressive transfusion in variceal bleeding has been shown to increase risk of rebound portal hypertension and rebleeding. - Transfusion threshold 7.0.  - CBC q8h. - Monitor vitals.  #Hepatic encephalopathy The patient has a history of hepatic encephalopathy and is on treatment at home. She had some  somnolence/decreased LOC upon presentation to the ED and has been difficult to arouse since admission, but with no acute personality changes or other significant behavioral changes. Her BAC was 213 on admission, so her decreased LOC is more likely due to intoxication rather than worsening of hepatic encephalopathy. - Continue home lactulose 20 g po bid. - Continue home rifaximin 550 mg po bid.  - Neuro checks q4h.  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 She may have some mild ascites on exam today, but reports it is the same as is typical for her. Her liver disease is Child-Pugh Class C (11 points); she has both damage to her liver from alcohol abuse as well as PSC, so she will likely require liver transplantation but is aware of sobriety requirements prior to consideration. - SBP prophylaxis as above. - Hepatic encephalopathy prophylaxis as above. - Monitor for ascites by physical exam.  #EtOH dependence The patient has a long  history of alcohol abuse and dependence. Her blood alcohol level was 213 upon admission. Her mother is planning on helping her complete outpatient rehab upon discharge, but the patient's interest in this is unknown. - CIWA protocol. - Social work consulted.  #Tobacco abuse The patient has a 20 pack-year smoking history. Her interest in quitting has not been assessed. - Counsel on smoking cessation once patient is more alert. - Consider nicotine patch if patient is interested.  #F/E/N  - NS @ 100 mL/hr.  - Electrolytes: stable. - Clear liquids per GI.  # DVT PPx: SCDs only given upper GI bleed   #Code status: Full code   Dispo: Disposition is deferred at this time, awaiting improvement of current medical problems. Anticipated discharge in approximately 1-2  day(s).   The patient does have a current PCP Gabriel Cirri, DO) and does need an [facility] hospital follow-up appointment after discharge.   This is a Psychologist, occupational Note.  The care of the patient was discussed with Dr. Aundria Rud and the assessment and plan formulated with their assistance.  Please see their attached note for official documentation of the daily encounter.   LOS: 1 day   Isaiah Serge, Med Student 11/05/2012, 11:30 AM

## 2012-11-06 ENCOUNTER — Encounter (HOSPITAL_COMMUNITY): Payer: Self-pay | Admitting: Gastroenterology

## 2012-11-06 DIAGNOSIS — I8510 Secondary esophageal varices without bleeding: Secondary | ICD-10-CM

## 2012-11-06 LAB — CBC
HCT: 22.3 % — ABNORMAL LOW (ref 36.0–46.0)
HCT: 22.2 % — ABNORMAL LOW (ref 36.0–46.0)
Hemoglobin: 8 g/dL — ABNORMAL LOW (ref 12.0–15.0)
MCH: 33.1 pg (ref 26.0–34.0)
MCH: 32.4 pg (ref 26.0–34.0)
MCHC: 35.9 g/dL (ref 30.0–36.0)
MCHC: 34.7 g/dL (ref 30.0–36.0)
MCV: 92.1 fL (ref 78.0–100.0)
MCV: 93.3 fL (ref 78.0–100.0)
Platelets: 146 10*3/uL — ABNORMAL LOW (ref 150–400)
Platelets: 145 10*3/uL — ABNORMAL LOW (ref 150–400)
RBC: 2.42 MIL/uL — ABNORMAL LOW (ref 3.87–5.11)
RDW: 22.7 % — ABNORMAL HIGH (ref 11.5–15.5)

## 2012-11-06 MED ORDER — FUROSEMIDE 20 MG PO TABS
20.0000 mg | ORAL_TABLET | Freq: Two times a day (BID) | ORAL | Status: DC
  Administered 2012-11-06 – 2012-11-07 (×2): 20 mg via ORAL
  Filled 2012-11-06 (×4): qty 1

## 2012-11-06 MED ORDER — SPIRONOLACTONE 100 MG PO TABS
100.0000 mg | ORAL_TABLET | Freq: Every day | ORAL | Status: DC
  Administered 2012-11-06 – 2012-11-07 (×2): 100 mg via ORAL
  Filled 2012-11-06 (×2): qty 1

## 2012-11-06 NOTE — Progress Notes (Signed)
  I have seen and examined the patient, and reviewed the daily progress note by Isaiah Serge, MS3 and discussed the care of the patient with them. Please see my progress note from 11/06/2012 for further details regarding assessment and plan.    Signed:  Rocco Serene, MD 11/06/2012, 3:30 PM

## 2012-11-06 NOTE — Progress Notes (Signed)
  Date: 11/06/2012  Patient name: [name]  Medical record number: [id number]  Date of birth: 1970-05-25   This patient has been seen and the plan of care was discussed with the house staff. Please see their note for complete details. I concur with their findings with the following additions/corrections: Hemodynamically stable. No complaints for me today. Stop Octreotide at 72 hours. Cont PO PPI. Agree with Rocephin for 3 days. Expect D/C in AM.  Jonah Blue, DO, FACP Faculty [facility] Internal Medicine Residency Program 11/06/2012, 1:38 PM

## 2012-11-06 NOTE — Progress Notes (Signed)
Patient was using bathroom with [facility] supervising and patient leaned back hitting herself on the lever that is located on the back of the toilet. Patient now has an abrasion along with a bruise on her back. Will continue to monitor.  Krishav Mamone J. Lendell Caprice RN

## 2012-11-06 NOTE — Progress Notes (Signed)
Subjective: Patient much more awake and talkative this morning.  She slept well and is eating well. She is requesting more frequent tramadol but no specific pain. No dark/bloody stools (in fact, no BM yet but just restarted lactulose and rifaximin).    Objective: Vital signs in last 24 hours: Filed Vitals:   11/05/12 2008 11/06/12 0500 11/06/12 0900 11/06/12 1501  BP: 102/64 117/76 117/76 120/69  Pulse: 89 86 86 95  Temp: 98.4 F (36.9 C) 98.2 F (36.8 C)  98.6 F (37 C)  TempSrc: Oral Oral  Oral  Resp: 18 18  20   Height:      Weight:  153 lb 14.1 oz (69.8 kg)    SpO2: 97% 97%  96%   Weight change: 3 lb 14.1 oz (1.761 kg)  Intake/Output Summary (Last 24 hours) at 11/06/12 1559 Last data filed at 11/06/12 1300  Gross per 24 hour  Intake   2440 ml  Output      0 ml  Net   2440 ml   General: alert, cooperative, and in no apparent distress HEENT: sclerae icteric   Neck: supple, no lymphadenopathy Lungs: clear to ascultation bilaterally, normal work of respiration, no wheezes, rales, ronchi Heart: regular rate and rhythm, no murmurs, gallops, or rubs Abdomen: somewhat distended (pt states this is her baseline) without appreciable fluid wave, soft, non-tender, normal bowel sounds Extremities: 2+ DP/PT pulses bilaterally, no cyanosis, clubbing, or edema Neurologic: alert & oriented X3, cranial nerves II-XII intact, strength grossly intact, sensation intact to light touch  Lab Results: Basic Metabolic Panel:  Recent Labs Lab 11/04/12 1031 11/04/12 1934 11/05/12 0028  NA 133*  --  136  K 4.2  --  4.2  CL 99  --  103  CO2 24  --  24  GLUCOSE 100*  --  95  BUN 15  --  17  CREATININE 0.57  --  0.50  CALCIUM 8.2*  --  8.1*  MG  --  1.5  --    Liver Function Tests:  Recent Labs Lab 11/04/12 1031  AST 72*  ALT 20  ALKPHOS 247*  BILITOT 5.3*  PROT 6.6  ALBUMIN 2.1*    Recent Labs Lab 11/04/12 1031  AMMONIA 176*   CBC:  Recent Labs Lab 11/05/12 1710  11/06/12 0815  WBC 9.8 7.7  HGB 7.7* 8.0*  HCT 21.6* 22.3*  MCV 91.9 92.1  PLT 147* 146*   Coagulation:  Recent Labs Lab 11/04/12 1031 11/05/12 0028  LABPROT 19.0* 19.8*  INR 1.64* 1.74*   Urine Drug Screen: Drugs of Abuse     Component Value Date/Time   LABOPIA NONE DETECTED 08/31/2012 0500   COCAINSCRNUR NONE DETECTED 08/31/2012 0500   LABBENZ NONE DETECTED 08/31/2012 0500   AMPHETMU NONE DETECTED 08/31/2012 0500   THCU NONE DETECTED 08/31/2012 0500   LABBARB NONE DETECTED 08/31/2012 0500    Alcohol Level:  Recent Labs Lab 11/04/12 1031  ETH 213*    Micro Results: Recent Results (from the past 240 hour(s))  MRSA PCR SCREENING     Status: None   Collection Time    11/04/12  4:10 PM      Result Value Range Status   MRSA by PCR NEGATIVE  NEGATIVE Final   Comment:            The GeneXpert MRSA Assay (FDA     approved for NASAL specimens     only), is one component of a     comprehensive  MRSA colonization     surveillance program. It is not     intended to diagnose MRSA     infection nor to guide or     monitor treatment for     MRSA infections.   Medications: I have reviewed the patient's current medications. Scheduled Meds: . cefTRIAXone (ROCEPHIN)  IV  2 g Intravenous Q24H  . folic acid  1 mg Oral Daily  . furosemide  20 mg Oral BID  . lactulose  20 g Oral BID  . multivitamin with minerals  1 tablet Oral Daily  . pantoprazole  40 mg Oral BID  . rifaximin  550 mg Oral BID  . sodium chloride  3 mL Intravenous Q12H  . spironolactone  100 mg Oral Daily  . thiamine  100 mg Oral Daily   Continuous Infusions: . octreotide (SANDOSTATIN) infusion 50 mcg/hr (11/06/12 1052)   PRN Meds:.LORazepam, LORazepam, ondansetron (ZOFRAN) IV, ondansetron, traMADol Assessment/Plan: # Upper GI bleed- Patient has history of alcoholic cirrhosis with ascites and esophageal varices s/p banding last month. 11/2 EGD with portal gastropathy, esophageal varix with protuberant vessel  now s/p banding x 7; no evidence of active bleeding. Afebrile, VSS. 11/4 Hgb 8.0, INR 1.74.  -[facility] (Dr. Madilyn Fireman) consulted, appreciate recs  -regular diet -discontinue IVFs -continue pantoprazole 40 mg PO BID  -continue octreotide 50 mcg/hr infusion x 72 hours total (ending 11/5) -continue IV Rocephin 2 g qd x 3 days total for SBP PPX (ending 11/5) -restart sprinolactone and Lasix now that BPs solid -lactulose, rifaximin at home doses -Zofran IV prn nausea (no evidence of further QT prolongation on repeat EKG) -CBCs BID, transfuse for Hgb < 7  #Cirrhosis and esophageal varices 2/2 to alcohol abuse with history of primary sclerosing colangitis- small to moderate sized varices in distal esophagus s/p banding on 09/10/12; varix with protuberant vessel at GE junction seen on 11/2 EGD. Patient does not have signs of SBP (no abdominal pain, fever or chills), no signs of hepatic encephalopathy (patient is oriented x3) but on SBP ppx per above given risk factors.   #Alcochol abuse- EtOH level on admission 213. Patient has done outpatient rehab in past per boyfriend. She was planning to pursue further outpatient rehab starting this weeks in hopes of becoming eligible for liver transplant.  -CiWa protocol; pt has not required any lorazepam for withdrawal sx thus far -thiamine, folic acid, mv supplementation  -social work consult   #Tobacco abuse- 1PPD x many years per boyfriend  -smoking cessation counseling  -nicotine patch if pt interested   #F/E/N- regular diet  #DVT PPX- SCDs only given UGIB   #Code status- Full code   Dispo: Disposition is deferred at this time, awaiting improvement of current medical problems.  Anticipated discharge tomorrow.   The patient does have a current PCP Gabriel Cirri, DO) and does need an [facility] hospital follow-up appointment after discharge.   .Services Needed at time of discharge: Y = Yes, Blank = No PT:   OT:   RN:   Equipment:   Other:     LOS: 2  days   Rocco Serene, MD 11/06/2012, 3:59 PM

## 2012-11-06 NOTE — Progress Notes (Signed)
Subjective: No acute events overnight. Patient is feeling well this morning and very alert. She is eating a regular diet without nausea or vomiting and is very pleased to be eating. She reports some RUQ that she believes started when she vomited blood; she reports it is keeping her from sleeping well and is not improved with tramadol. She also reports that she sometimes takes 2 tramadol pills at home (50mg  each) for pain and to help her sleep. She has not had a bowel movement since admission but believes the lactulose and rifaximin that we are giving her will help as they keep her regular at home.  Objective: Vital signs in last 24 hours: Filed Vitals:   11/05/12 1723 11/05/12 2008 11/06/12 0500 11/06/12 0900  BP: 107/72 102/64 117/76 117/76  Pulse: 86 89 86 86  Temp: 98.5 F (36.9 C) 98.4 F (36.9 C) 98.2 F (36.8 C)   TempSrc: Oral Oral Oral   Resp: 16 18 18    Height:      Weight:   69.8 kg (153 lb 14.1 oz)   SpO2: 97% 97% 97%    Weight change: 1.761 kg (3 lb 14.1 oz)  Intake/Output Summary (Last 24 hours) at 11/06/12 1130 Last data filed at 11/06/12 0940  Gross per 24 hour  Intake   3055 ml  Output    450 ml  Net   2605 ml    Physical Exam General: Alert woman laying in bed in NAD.  HEENT: PERRLA, sclera icteric.  Pulmonary: Clear to auscultation bilaterally, no increased work of breathing.  Cardiovascular: Regular rate and rhythm, S1 and S2 normal, no murmur, rub, or gallop appreciated.  Abdomen: Bowel sounds active. Distended abdomen but with no fluid wave appreciated, tenderness to deep palpation with maximum tenderness in RUQ and RLQ. No masses, liver edge palpable 3 cm below costal margin, small umbilical hernia and ventral hernia noted without erythema or warmth suggestive of incarceration.  Extremities: All four extremities warm, well-perfused, without cyanosis or edema.  Neurologic: AAOx3.  Lab Results: Recent Labs Lab 11/05/12 0730 11/05/12 1710 11/06/12 0815    HGB 7.9* 7.7* 8.0*  HCT 22.2* 21.6* 22.3*  WBC 10.3 9.8 7.7  PLT 166 147* 146*   Recent Labs Lab 11/04/12 1031 11/05/12 0028  NA 133* 136  K 4.2 4.2  CL 99 103  CO2 24 24  GLUCOSE 100* 95  BUN 15 17  CREATININE 0.57 0.50  CALCIUM 8.2* 8.1*  GFRNONAA >90 >90  GFRAA >90 >90   Recent Labs Lab 11/04/12 1031 11/05/12 0028  AST 72*  --   ALT 20  --   ALKPHOS 247*  --   BILITOT 5.3*  --   PROT 6.6  --   ALBUMIN 2.1*  --   INR 1.64* 1.74*    Micro Results:  MRSA PCR screen 11/22/2012: negative  Studies/Results:  EGD 11/04/2012 (by Dr. Madilyn Fireman): Impression - esophageal varices with stigmata of hemorrhage status post banding  EKG 11/05/2012 (reviewed with Dr. Shirlee Latch): negative with QT/QTc 402/499 (resolved from prior 11/22/2012 EKG)   EKG 11/04/2012 (reviewed with Dr. Shirlee Latch): negative except for QT prolongation (QT/QTc 401/515)  Medications: I have reviewed the patient's current medications. Scheduled Meds: . cefTRIAXone (ROCEPHIN)  IV  2 g Intravenous Q24H  . folic acid  1 mg Oral Daily  . furosemide  20 mg Oral BID  . lactulose  20 g Oral BID  . multivitamin with minerals  1 tablet Oral Daily  . pantoprazole  40  mg Oral BID  . rifaximin  550 mg Oral BID  . sodium chloride  3 mL Intravenous Q12H  . spironolactone  100 mg Oral Daily  . thiamine  100 mg Oral Daily   Continuous Infusions: . octreotide (SANDOSTATIN) infusion 50 mcg/hr (11/06/12 1052)   PRN Meds:.LORazepam, LORazepam, ondansetron (ZOFRAN) IV, ondansetron, traMADol   Assessment/Plan: This is a 42 y.o. 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 She underwent subsequent banding of esophageal varices and is currently stable.   #Abdominal pain The patient has new, mild abdominal pain in the RUQ and RLQ that is likely related to her liver disease. The location in the RUQ is  highly suggestive of this, as well as its onset correlated with her most recent exacerbation with portal hypertension. Pain management options are limited in this patient; NSAIDs are contraindicated due to recent GI bleed and narcotics are mostly metabolized in the liver, leaving tramadol (primarily kidney metabolism with some liver) as an option. Fentanyl also has less liver metabolism than other narcotics, but the abuse potential is very high and so avoidance is prudent. Tramadol dosing is recommended no more than 50 mg q12h in a patient with liver disease ([employer] and spoke with Pharmacy). The patient has a home prescription of 50 mg q6h prn and often takes more than this, so q12h is unlikely to control her pain. However, yesterday she only took 2 doses of tramadol, so with actual q6h dosing her pain may be better controlled. - Continue tramadol 50 mg q6h prn and advise patient to take when needed.   #Acute upper GI hemorrhage/esophageal varices s/p banding, stable The patient has a history of alcoholic cirrhosis with prior ascites s/p paracentesis 06/2012 and 08/2012 and esophageal varices s/p banding 09/2012. More esophageal varices with stigmata of recent bleeding were seen on urgent EGD 11/04/2012, making bleeding esophageal varices the most likely cause of her upper GI bleed. Other major causes of upper GI bleed to be considered are gastrointestinal ulcers, tumors, or Mallory-Weiss tears, but none of these were seen on EGD. Additionally, the patient's history of cirrhosis, PSC, ascites, and esophageal varices is strongly supportive of portal hypertension with bleeding esophageal varices as the cause of her upper GI bleed. Typical management of an upper GI bleed from esophageal varices includes PPI to promote clot formation, antibiotics to prophylax for SBP, octreotide to decrease portal blood flow, and resuscitation with IVFs and transfusions if needed (see below for details r.e. transfusion). She has  no symptoms currently of SBP (abdominal pain, fever, chills). She is currently doing well with stable vitals. - Appreciate GI consult recommendations (Dr. Madilyn Fireman with [facility]).  - Protonix  40 mg po bid.  - Rocephin 2 g IV q24h. GI recs to continue for a total of 3-5 days, so we will continue for 3 days (last dose 11/06/2012) and give 2 days of an appropriate po antibiotic (UpToDate recommends norfloxacin 400 mg bid).  - Octreotide infusion 50 mcg/hr IV. GI recs to continue for a total of 3 days, so last day will be 11/06/2012.  - Zofran 4 mg IV or po prn. - Saline lock since patient has adequate po intake currently. - Regular diet.   #Acute blood loss anemia, stable The patient was hypotensive (90-110s systolic) and mildly tachycardic (90s-100s) on admission, suggesting volume loss consistent with her EGD findings of esophageal varices with stigmata of recent  bleeding (but no active bleeding). She also has a normocytic anemia likely secondary to blood loss from esophageal varices. Her hemoglobin is currently stable (8.0 --> 8.4 --> 7.9 --> 7.7--> 8.0) and she has no signs of active bleeding; her blood pressure is wnl and she is no longer tachycardic. She received 1 U PRBCs in the ED yesterday. A transfusion threshold of 9.0 can be used in patients with significant comorbidities such as CAD, which the patient does not have; more aggressive transfusion in variceal bleeding has been shown to increase risk of rebound portal hypertension and rebleeding.  - Transfusion threshold 7.0.  - CBC bid to trend hemoglobin. - Monitor vitals.   #Hepatic encephalopathy  The patient has a history of hepatic encephalopathy and is on treatment at home. She had some somnolence/decreased LOC upon presentation to the ED and was difficult to arouse for HD1-2 admission, but is currently very alert and has had no acute personality changes or other significant behavioral changes. She does have some trouble remembering things  like names of medications, which is her baseline. Her BAC was 213 on admission, so her decreased LOC is more likely due to intoxication rather than worsening of hepatic encephalopathy.  - Continue home lactulose 20 g po bid.  - Continue home rifaximin 550 mg po bid.  - Neuro checks q4h.   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 She may have some mild ascites on exam today, but reports it is the same as is typical for her. Her liver disease is Child-Pugh Class C (11 points); she has both damage to her liver from alcohol abuse as well as PSC, so she will likely require liver transplantation but is aware of sobriety requirements prior to consideration.  - SBP prophylaxis as above.  - Hepatic encephalopathy prophylaxis as above.  - Monitor for ascites by physical exam. She reports that her current abdominal size is similar to her baseline at home. - Restart home spironolactone 100 mg bid (was held for hypotension). - Restart home Lasix 20 mg bid (was held for hypotension). The patient reports she was not taking this at home due to cost and accessibility issues.   #EtOH dependence  The patient has a long history of alcohol abuse and dependence. Her blood alcohol level was 213 upon admission. Her mother is planning on helping her complete outpatient rehab upon discharge, but the patient's interest in this is unknown.  - CIWA protocol. Has not required Ativan. - Social work consulted.  - Thiamine, folic acid, and multivitamin supplemented. - Counseled r.e. any alcohol use being very dangerous. Patient had many questions about why small amounts of alcohol were dangerous, and I explained how alcohol exacerbates her severe liver disease.  #Tobacco abuse  The patient has a 20 pack-year smoking history.  Her interest in quitting has not been assessed.  - Counsel on smoking cessation once patient is more alert.  - Consider nicotine patch if patient is interested.   #F/E/N  - Saline lock.  - Electrolytes: stable.  - Regular diet.   # DVT PPx: SCDs only given upper GI bleed   #Code status: Full code   Dispo: Disposition is deferred at this time, awaiting improvement of current medical problems. Anticipated discharge in approximately 1 day(s).   The patient does have a current PCP Gabriel Cirri,  DO) and does need an [facility] hospital follow-up appointment after discharge.   This is a Psychologist, occupational Note.  The care of the patient was discussed with Dr. Aundria Rud and the assessment and plan formulated with their assistance.  Please see their attached note for official documentation of the daily encounter.   LOS: 2 days   Isaiah Serge, Med Student 11/06/2012, 11:30 AM

## 2012-11-07 ENCOUNTER — Telehealth: Payer: Self-pay | Admitting: Student

## 2012-11-07 DIAGNOSIS — F1010 Alcohol abuse, uncomplicated: Secondary | ICD-10-CM

## 2012-11-07 DIAGNOSIS — I8510 Secondary esophageal varices without bleeding: Secondary | ICD-10-CM | POA: Diagnosis present

## 2012-11-07 DIAGNOSIS — I8501 Esophageal varices with bleeding: Principal | ICD-10-CM

## 2012-11-07 LAB — TYPE AND SCREEN
ABO/RH(D): O NEG
Antibody Screen: NEGATIVE
Unit division: 0

## 2012-11-07 LAB — CBC
HCT: 22.3 % — ABNORMAL LOW (ref 36.0–46.0)
Hemoglobin: 7.8 g/dL — ABNORMAL LOW (ref 12.0–15.0)
MCH: 32.8 pg (ref 26.0–34.0)
MCHC: 35 g/dL (ref 30.0–36.0)
MCV: 93.7 fL (ref 78.0–100.0)
Platelets: 172 K/uL (ref 150–400)
RBC: 2.38 MIL/uL — ABNORMAL LOW (ref 3.87–5.11)
RDW: 22.8 % — ABNORMAL HIGH (ref 11.5–15.5)
WBC: 9.1 K/uL (ref 4.0–10.5)

## 2012-11-07 MED ORDER — FUROSEMIDE 20 MG PO TABS: 20.0000 mg | ORAL_TABLET | Freq: Two times a day (BID) | ORAL | Status: DC

## 2012-11-07 MED ORDER — CIPROFLOXACIN HCL 500 MG PO TABS: 500.0000 mg | ORAL_TABLET | Freq: Two times a day (BID) | ORAL | Status: DC

## 2012-11-07 MED ORDER — TRAMADOL HCL 50 MG PO TABS: 50.0000 mg | ORAL_TABLET | Freq: Four times a day (QID) | ORAL | Status: DC | PRN

## 2012-11-07 NOTE — Progress Notes (Signed)
Subjective: Pt slept well, eating well; had BM this AM that was somewhat dark, no blood.  No nausea/vomiting (pt not requiring any Zofran).  Asking for tramadol for pain with blood draw, just took at 7a.  Ready to go home.   Objective: Vital signs in last 24 hours: Filed Vitals:   11/06/12 0900 11/06/12 1501 11/06/12 2148 11/07/12 0517  BP: 117/76 120/69 118/74 115/74  Pulse: 86 95 88 86  Temp:  98.6 F (37 C) 98.5 F (36.9 C) 98.3 F (36.8 C)  TempSrc:  Oral Oral Oral  Resp:  20 19 18   Height:      Weight:    151 lb 10.8 oz (68.8 kg)  SpO2:  96% 94% 98%   Weight change: -2 lb 3.3 oz (-1 kg)  Intake/Output Summary (Last 24 hours) at 11/07/12 0701 Last data filed at 11/06/12 1300  Gross per 24 hour  Intake    480 ml  Output      0 ml  Net    480 ml   General: alert, cooperative, and in no apparent distress HEENT: sclerae icteric   Neck: supple, no lymphadenopathy Lungs: clear to ascultation bilaterally, normal work of respiration, no wheezes, rales, ronchi Heart: regular rate and rhythm, no murmurs, gallops, or rubs Abdomen: somewhat distended (appears less so than yesterday, pt states this is her baseline) without appreciable fluid wave, soft, non-tender, normal bowel sounds Extremities: 2+ DP/PT pulses bilaterally, no cyanosis, clubbing, or edema Neurologic: alert & oriented X3, cranial nerves II-XII intact, strength grossly intact, sensation intact to light touch  Lab Results: Basic Metabolic Panel:  Recent Labs Lab 11/04/12 1031 11/04/12 1934 11/05/12 0028  NA 133*  --  136  K 4.2  --  4.2  CL 99  --  103  CO2 24  --  24  GLUCOSE 100*  --  95  BUN 15  --  17  CREATININE 0.57  --  0.50  CALCIUM 8.2*  --  8.1*  MG  --  1.5  --    Liver Function Tests:  Recent Labs Lab 11/04/12 1031  AST 72*  ALT 20  ALKPHOS 247*  BILITOT 5.3*  PROT 6.6  ALBUMIN 2.1*    Recent Labs Lab 11/04/12 1031  AMMONIA 176*   CBC:  Recent Labs Lab 11/06/12 0815  11/06/12 1845  WBC 7.7 8.5  HGB 8.0* 7.7*  HCT 22.3* 22.2*  MCV 92.1 93.3  PLT 146* 145*   Coagulation:  Recent Labs Lab 11/04/12 1031 11/05/12 0028  LABPROT 19.0* 19.8*  INR 1.64* 1.74*   Urine Drug Screen: Drugs of Abuse     Component Value Date/Time   LABOPIA NONE DETECTED 08/31/2012 0500   COCAINSCRNUR NONE DETECTED 08/31/2012 0500   LABBENZ NONE DETECTED 08/31/2012 0500   AMPHETMU NONE DETECTED 08/31/2012 0500   THCU NONE DETECTED 08/31/2012 0500   LABBARB NONE DETECTED 08/31/2012 0500    Alcohol Level:  Recent Labs Lab 11/04/12 1031  ETH 213*    Micro Results: Recent Results (from the past 240 hour(s))  MRSA PCR SCREENING     Status: None   Collection Time    11/04/12  4:10 PM      Result Value Range Status   MRSA by PCR NEGATIVE  NEGATIVE Final   Comment:            The GeneXpert MRSA Assay (FDA     approved for NASAL specimens     only), is one component  of a     comprehensive MRSA colonization     surveillance program. It is not     intended to diagnose MRSA     infection nor to guide or     monitor treatment for     MRSA infections.   Medications: I have reviewed the patient's current medications. Scheduled Meds: . folic acid  1 mg Oral Daily  . furosemide  20 mg Oral BID  . lactulose  20 g Oral BID  . multivitamin with minerals  1 tablet Oral Daily  . pantoprazole  40 mg Oral BID  . rifaximin  550 mg Oral BID  . sodium chloride  3 mL Intravenous Q12H  . spironolactone  100 mg Oral Daily  . thiamine  100 mg Oral Daily     PRN Meds:.LORazepam, LORazepam, ondansetron (ZOFRAN) IV, ondansetron, traMADol  Assessment/Plan: # Upper GI bleed- Patient has history of alcoholic cirrhosis with ascites and esophageal varices s/p banding last month. 11/2 EGD with portal gastropathy, esophageal varix with protuberant vessel now s/p banding x 7; no evidence of active bleeding. Afebrile, VSS. 11/5 Hgb 7.8. -[facility] (Dr. Madilyn Fireman) consulted, appreciate recs;  comfortable with d/c today -continue pantoprazole 40 mg PO BID  -octreotide infusion discontinued 11/5 at midnight; pt was supposed to get 72 hours total per GI, unclear why discontinued early but confirmed ok with GI -completed IV Rocephin 2 g IV qd x 3 days on 11/4; will give two additional days of oral ciprofloxacin at discharge for SBP PPX  -continue sprinolactone, Lasix, lactulose, rifaximin at home doses  #Cirrhosis and esophageal varices 2/2 to alcohol abuse with history of primary sclerosing colangitis- small to moderate sized varices in distal esophagus s/p banding on 09/10/12; varix with protuberant vessel at GE junction seen on 11/2 EGD. Patient does not have signs of SBP (no abdominal pain, fever or chills), no signs of hepatic encephalopathy (patient is oriented x3) but on SBP ppx per above given risk factors.   #Alcochol abuse- EtOH level on admission 213. Patient has done outpatient rehab in past per boyfriend. She was planning to pursue further outpatient rehab starting this weeks in hopes of becoming eligible for liver transplant.  -CiWa protocol; pt has not required any lorazepam for withdrawal sx -thiamine, folic acid, mv supplementation  -social work consult   #Tobacco abuse- 1PPD x many years per boyfriend  -smoking cessation counseling, pt does not want nicotine patch  #F/E/N- regular diet  #DVT PPX- SCDs only given UGIB   #Code status- Full code   Dispo: Disposition is deferred at this time, awaiting improvement of current medical problems.  Anticipated discharge today.   The patient does have a current PCP Gabriel Cirri, DO) and does need an [facility] hospital follow-up appointment after discharge.   .Services Needed at time of discharge: Y = Yes, Blank = No PT:   OT:   RN:   Equipment:   Other:     LOS: 3 days   Rocco Serene, MD 11/07/2012, 7:01 AM

## 2012-11-07 NOTE — Progress Notes (Signed)
  I have seen and examined the patient, and reviewed the daily progress note by Isaiah Serge, MS3 and discussed the care of the patient with them. Please see my progress note from 11/07/2012 for further details regarding assessment and plan.    Signed:  Rocco Serene, MD 11/07/2012, 1:28 PM

## 2012-11-07 NOTE — Progress Notes (Signed)
Subjective: No acute events overnight. Patient is feeling well this morning wants to go home. She is eating a regular diet without nausea or vomiting. She had a bowel movement this morning that was dark, but with no bright red blood.   Objective: Vital signs in last 24 hours: Filed Vitals:   11/06/12 0900 11/06/12 1501 11/06/12 2148 11/07/12 0517  BP: 117/76 120/69 118/74 115/74  Pulse: 86 95 88 86  Temp:  98.6 F (37 C) 98.5 F (36.9 C) 98.3 F (36.8 C)  TempSrc:  Oral Oral Oral  Resp:  20 19 18   Height:      Weight:    68.8 kg (151 lb 10.8 oz)  SpO2:  96% 94% 98%   Weight change: -1 kg (-2 lb 3.3 oz)  Intake/Output Summary (Last 24 hours) at 11/07/12 0704 Last data filed at 11/06/12 1300  Gross per 24 hour  Intake    480 ml  Output      0 ml  Net    480 ml    Physical Exam General: Alert woman laying in bed in NAD.  HEENT: PERRLA, sclera icteric.  Pulmonary: Clear to auscultation bilaterally, no increased work of breathing.  Cardiovascular: Regular rate and rhythm, S1 and S2 normal, no murmur, rub, or gallop appreciated.  Abdomen: Bowel sounds active. Distended abdomen but with no fluid wave appreciated, diffusely tender to deep palpation. No masses, liver edge palpable 3 cm below costal margin, small umbilical hernia and ventral hernia noted without erythema or warmth suggestive of incarceration.  Extremities: All four extremities warm, well-perfused, without cyanosis or edema.  Neurologic: AAOx3.  Lab Results:  Recent Labs Lab 11/04/12 1031 11/05/12 0028  NA 133* 136  K 4.2 4.2  CL 99 103  CO2 24 24  GLUCOSE 100* 95  BUN 15 17  CREATININE 0.57 0.50  CALCIUM 8.2* 8.1*  GFRNONAA >90 >90  GFRAA >90 >90     Recent Labs Lab 11/05/12 1710 11/06/12 0815 11/06/12 1845  HGB 7.7* 8.0* 7.7*  HCT 21.6* 22.3* 22.2*  WBC 9.8 7.7 8.5  PLT 147* 146* 145*     Recent Labs Lab 11/04/12 1031  AST 72*  ALT 20  ALKPHOS 247*  BILITOT 5.3*  PROT 6.6    ALBUMIN 2.1*    Micro Results:  MRSA PCR screen 11/22/2012: negative   Studies/Results:  EGD 11/04/2012 (by Dr. Madilyn Fireman): Impression - esophageal varices with stigmata of hemorrhage status post banding  EKG 11/05/2012 (reviewed with Dr. Shirlee Latch): negative with QT/QTc 402/499 (resolved from prior 11/22/2012 EKG)  EKG 11/04/2012 (reviewed with Dr. Shirlee Latch): negative except for QT prolongation (QT/QTc 401/515)   Medications: I have reviewed the patient's current medications. Scheduled Meds: . folic acid  1 mg Oral Daily  . furosemide  20 mg Oral BID  . lactulose  20 g Oral BID  . multivitamin with minerals  1 tablet Oral Daily  . pantoprazole  40 mg Oral BID  . rifaximin  550 mg Oral BID  . sodium chloride  3 mL Intravenous Q12H  . spironolactone  100 mg Oral Daily  . thiamine  100 mg Oral Daily   Continuous Infusions:  PRN Meds:.LORazepam, LORazepam, ondansetron (ZOFRAN) IV, ondansetron, traMADol   Assessment/Plan: This is a 42 y.o. 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 She underwent subsequent banding of esophageal varices and is currently stable and ready for discharge.   #  Abdominal pain, resolved The patient has abdominal pain in the RUQ and RLQ that is likely related to her liver disease that has resolved with tramadol. The location in the RUQ is highly suggestive of this, as well as its onset correlated with her most recent exacerbation with portal hypertension. Pain management options are limited in this patient; NSAIDs are contraindicated due to recent GI bleed and narcotics are mostly metabolized in the liver, leaving tramadol (primarily kidney metabolism with some liver) as an option. Fentanyl also has less liver metabolism than other narcotics, but the abuse potential is very high and so avoidance is prudent. Tramadol dosing is recommended no more  than 50 mg q12h in a patient with liver disease ([employer] and spoke with Pharmacy). The patient has a home prescription of 50 mg q6h prn and often takes more than this, so q12h is unlikely to control her pain. However, yesterday she only took 2 doses of tramadol, so with actual q6h dosing her pain may be better controlled.  - Continue tramadol 50 mg q6h prn. Patient will be discharged with 1 week tramadol 50 mg q6h prn (f/u with PCP scheduled . Counseled patient on dangers of overuse with concomitant cirrhosis.  #Acute upper GI hemorrhage/esophageal varices s/p banding, stable  The patient has a history of alcoholic cirrhosis with prior ascites s/p paracentesis 06/2012 and 08/2012 and esophageal varices s/p banding 09/2012. More esophageal varices with stigmata of recent bleeding were seen on urgent EGD 11/04/2012, making bleeding esophageal varices the most likely cause of her upper GI bleed. Other major causes of upper GI bleed to be considered are gastrointestinal ulcers, tumors, or Mallory-Weiss tears, but none of these were seen on EGD. Additionally, the patient's history of cirrhosis, PSC, ascites, and esophageal varices is strongly supportive of portal hypertension with bleeding esophageal varices as the cause of her upper GI bleed. Typical management of an upper GI bleed from esophageal varices includes PPI to promote clot formation, antibiotics to prophylax for SBP, octreotide to decrease portal blood flow, and resuscitation with IVFs and transfusions if needed (see below for details r.e. transfusion). She has no symptoms currently of SBP (abdominal pain, fever, chills). She is currently doing well with stable vitals.  - Appreciate GI consult recommendations (Dr. Madilyn Fireman with [facility]). Spoke with Dr. Madilyn Fireman this morning and he agreed that she is stable for discharge from his point as well. - Continue home medication Protonix 40 mg po bid.  - Rocephin 2 g IV q24h ended 11/06/2012 (3 days given; GI  recs for a total of 3-5 days). Will give 3 days of Ciprofloxacin 500 mg bid (UpToDate recommends Cipro [phone] mg for prophylaxis - Cipro chosen over Norfloxacin because it is Medicaid preferred, which was confirmed ok with GI). - Octreotide infusion 50 mcg/hr IV ended 11/06/2012 (54 hours given; GI recs for a total of 72 hours but confirmed are okay with 54 hours) . - Zofran 4 mg po prn.  - Saline lock since patient has adequate po intake currently.  - Regular diet.   #Acute blood loss anemia, stable  The patient was hypotensive (90-110s systolic) and mildly tachycardic (90s-100s) on admission, suggesting volume loss consistent with her EGD findings of esophageal varices with stigmata of recent bleeding (but no active bleeding). She also has a normocytic anemia likely secondary to blood loss from esophageal varices. Her hemoglobin is currently stable (~8) and she has no signs of active bleeding; her blood pressure is wnl and she is no longer tachycardic.  She received 1 U PRBCs in the ED yesterday. A transfusion threshold of 9.0 can be used in patients with significant comorbidities such as CAD, which the patient does not have; more aggressive transfusion in variceal bleeding has been shown to increase risk of rebound portal hypertension and rebleeding.  - Transfusion threshold 7.0.  - CBC bid to trend hemoglobin. Hb stable (7.8 today).  - Monitor vitals.   #Hepatic encephalopathy  The patient has a history of hepatic encephalopathy and is on treatment at home. She had some somnolence/decreased LOC upon presentation to the ED and was difficult to arouse for HD1-2 admission, but is currently very alert and has had no acute personality changes or other significant behavioral changes. She does have some trouble remembering things like names of medications, which is her baseline. Her ammonia level was 176 on admission. Her BAC was 213 on admission, so her decreased LOC was more likely due to intoxication  rather than worsening of hepatic encephalopathy.  - Continue home lactulose 20 g po bid.  - Continue home rifaximin 550 mg po bid.  - Neuro checks q4h.   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 She may have some mild ascites on exam today, but reports it is the same as is typical for her. Her liver disease is Child-Pugh Class C (11 points); she has both damage to her liver from alcohol abuse as well as PSC, so she will likely require liver transplantation but is aware of sobriety requirements prior to consideration.  - SBP prophylaxis as above.  - Hepatic encephalopathy prophylaxis as above.  - Monitor for ascites by physical exam. She reports that her current abdominal size is similar to her baseline at home.  - Continue home spironolactone 100 mg bid (was held for hypotension).  - Continue home Lasix 20 mg bid (was held for hypotension). The patient reports she was not taking this at home due to cost and accessibility issues.   #EtOH dependence  The patient has a long history of alcohol abuse and dependence. Her blood alcohol level was 213 upon admission. - CIWA protocol. Required Ativan x2 for agitation but no withdrawal seizures noted. - Social work consulted.  - Thiamine, folic acid, and multivitamin supplemented.  - Patient's mother is planning on helping her complete outpatient rehab upon discharge. Patient is interested in this and plans on compliance. - Counseled r.e. any alcohol use being very dangerous. Patient had many questions about why small amounts of alcohol were dangerous, and I explained how alcohol exacerbates her severe liver disease.   #Tobacco abuse  The patient has a 20 pack-year smoking history. Her interest in quitting has not been assessed.  - Counsel on smoking  cessation. - Consider nicotine patch if patient is interested.   #F/E/N  - Saline lock.  - Electrolytes: stable.  - Regular diet.   # DVT PPx: SCDs only given upper GI bleed. Patient is refusing.  #Code status: Full code   Dispo: Disposition is discharge at this time; the patient is medically stable with no further intervention needed.  The patient does have a current PCP Gabriel Cirri, DO - seeing [place]) and does need an [facility] hospital follow-up appointment after discharge.   This is a Psychologist, occupational Note.  The care of the patient was discussed with Dr. Aundria Rud and  the assessment and plan formulated with their assistance.  Please see their attached note for official documentation of the daily encounter.   LOS: 3 days   Isaiah Serge, Med Student 11/07/2012, 7:04 AM

## 2012-11-07 NOTE — Telephone Encounter (Signed)
After obtaining the patient's permission to call her mother and update her on her hospital stay, I called the patient's mother. She had questions about the patient's hospital course and discharge medications. I answered these questions and reassured her that the patient's discharge paperwork would also include a list of the medications she will be taking at home (only new one is Ciprofloxacin, and we sent a new prescription of Lasix to the patient's pharmacy and a prescription for Tramadol for 1 week was printed). She had no other questions at that time.  Isaiah Serge, Med Student 11/07/2012   11:50AM

## 2012-11-07 NOTE — Discharge Summary (Signed)
Name: [name] MRN: [id number] DOB: 12/18/1970 42 y.o. PCP: [name] Cirri, DO  Date of Admission: 11/04/2012  9:48 AM Date of Discharge: 11/07/2012 Attending Physician: [name] Blue, DO  Discharge Diagnosis: Principal Problem:   Acute upper GI hemorrhage Active Problems:   EtOH dependence   Encephalopathy, hepatic   Acute blood loss anemia   Esophageal varices in alcoholic cirrhosis  Discharge Medications:   Medication List         ciprofloxacin 500 MG tablet  Commonly known as:  CIPRO  Take 1 tablet (500 mg total) by mouth 2 (two) times daily.     folic acid 1 MG tablet  Commonly known as:  FOLVITE  Take 1 mg by mouth daily.     furosemide 20 MG tablet  Commonly known as:  LASIX  Take 1 tablet (20 mg total) by mouth 2 (two) times daily.     furosemide 20 MG tablet  Commonly known as:  LASIX  Take 20 mg by mouth 2 (two) times daily.     lactulose 10 GM/15ML solution  Commonly known as:  CHRONULAC  Take 20 g by mouth 2 (two) times daily.     pantoprazole 40 MG tablet  Commonly known as:  PROTONIX  Take 40 mg by mouth 2 (two) times daily.     potassium chloride 20 MEQ/15ML (10%) solution  Take 20 mEq by mouth 2 (two) times daily.     propranolol 20 MG tablet  Commonly known as:  INDERAL  Take 20 mg by mouth daily.     rifaximin 550 MG Tabs tablet  Commonly known as:  XIFAXAN  Take 550 mg by mouth 2 (two) times daily.     spironolactone 100 MG tablet  Commonly known as:  ALDACTONE  Take 100 mg by mouth daily.     thiamine 100 MG tablet  Commonly known as:  VITAMIN B-1  Take 100 mg by mouth daily.     traMADol 50 MG tablet  Commonly known as:  ULTRAM  Take 1 tablet (50 mg total) by mouth every 6 (six) hours as needed for moderate pain.     traMADol 50 MG tablet  Commonly known as:  ULTRAM  Take 50 mg by mouth every 6 (six) hours as needed for pain.     ursodiol 300 MG capsule  Commonly known as:  ACTIGALL  Take 300 mg by mouth 2 (two)  times daily.     WOMENS ONE DAILY Tabs  Take 1 tablet by mouth daily.        Disposition and follow-up:   [name] was discharged from [facility] in Stable condition.  At the hospital follow up visit please address:  1.  Medication compliance, EtOH status  2.  Labs / imaging needed at time of follow-up: ?CBC  3.  Pending labs/ test needing follow-up: none  Follow-up Appointments:     Follow-up Information   Follow up with [name] On 11/13/2012. (2:30pm)    Contact information:   [address] [place], [place] [phone]      Discharge Instructions: Discharge Orders   Future Orders Complete By Expires   Call MD for:  persistant nausea and vomiting  As directed    Diet - low sodium heart healthy  As directed    Increase activity slowly  As directed       Consultations: GI  Procedures Performed: EGD with banding  Admission HPI:  [name] is a 42  year old woman with active alcohol abuse, cirrhosis with ascites and esophageal varices (seen on 09/10/12 EGD, s/p banding x 4-5), and PSC (seen on 03/23/09 MRCP) who presented after multiple episodes of hematemesis beginning this morning around 7am. Majority of history provided by her boyfriend [name] who was at bedside.  Patient was recently admitted in 09/2012 for UGIB with esophageal varices discovered on EGD and had reportedly been sober from alcohol since then until two days ago. Mr. [name] reports that last night 11/1, patient was "acting tipsy" and somnolent, specifically sitting down and putting her head between her knees. He thinks she was drinking then as well as this morning but notes that she is sneaky and hides her vodka well. She vomited bright red blood multiple times this morning so Mr. [name] called EMS. She had not complained of any pain prior to vomiting, had any changes in personality other than those described the night prior. Later in interview when patient more alert, she denied  abdominal pain, dark or bloody stools, fever/chills, chest pain, or shortness of breath.  She has also had prior hospitalizations with paracentesis for ascites (06/2012 and 08/2012) as well as aforementioned hospitalization last month with esophageal varices were discovered on EGD and banded. Patient's mother, who is her healthcare POA and lives in [place], was planning on coming to [place] this Thursday to help the patient gio through outpatient alcohol rehab program in [place], [place] so she could be considered for liver transplantation at [facility]. Patient follows at [facility].  Patient manages her own medications and uses pill box for organization. She has three children, ages 48, 41, and 19 years old; she lives at home with 44 year old. Mr. [name] states the child is well cared for, he helps out when necessary. Patient does not work and does not have a Health visitor.    Hospital Course by problem list:  # Upper GI bleed- Patient has history of alcoholic cirrhosis with ascites s/p therapeutic paracenteses in 06/2012 and 08/2012, esophageal varices s/p banding in 09/2012 previously.  She presented with multiple episodes of hematemesis, hypotension (90s-110s systolic), and mild tachycardia (90s-100s).  Hemoglobin 8.0 on admission CBC; she was transfused 1 unit PRBC in ED.  GI was consulted, urgent EGD showed esophageal varix with protuberant vessel that was banded x 7 but no evidence of active bleeding.  Hemoglobin remained stable around 8.0 throughout admission, no further transfusions were required.  She was treated with pantoprazole (bolus in ED followed by infusion then converted to 40 mg PO BID), octreotide 50 mbg bolus followed by 50 mcg/hr infusion x >48 hours per GI recs, ceftriaxone 2 g q24 hrs x 3 days for SBP prophylaxis.  Patient was eating well without nausea/vomiting on hospital day 2.  She exhibited no signs of infection but was discharged with ciprofloxacin 500 mg PO BID x additional 3 days for full course of SBP  PPX.  Patient had many questions regarding her medications and condition.  Medical student [name] provided extensive patient education.   #Cirrhosis and esophageal varices 2/2 to alcohol abuse with history of primary sclerosing colangitis (seen on MRCP in 03/2009)- Patient's liver disease is Child-Pugh Class C; damage to liver from both alcohol abuse and PSC so she will likely require liver transplantation.  She is aware of sobriety requirements prior to consideration and is planning on pursuing transplantation at [facility] after completing rehab in [place], [place].   Small to moderate sized varices in distal esophagus s/p banding on  09/10/2012 and 11/05/12.  She had some mild ascites on exam, but reported it was at her baseline.  Home meds spironolactone and Lasix were initially held for hypotension but restarted on hospital day 2.  She was discharged with a new rx for Lasix as she stated that she had not been able to obtain that medication previously.  She also has a history of hepatic encephalopathy and is on lactulose and rifaximin at home; these medicines were continued starting on hospital day 2.  She did have some somnolence at presentation but BAC was 213 mg/dL at that time; therefore, decreased level of consciousness more likely due to EtOH intoxication rather than worsening of hepatic encephalopathy.  She did not display signs of withdrawal for alcohol during admission.     #Alcochol abuse- See above.  Long history of alcohol abuse with previous outpatient rehab. CiWA protocol initiated at admission, but patient never required lorazepam for signs of withdrawal.  Thiamine, folic acid, and multivitamin were supplemented daily.     Discharge Vitals:   BP 115/74  Pulse 86  Temp(Src) 98.3 F (36.8 C) (Oral)  Resp 18  Ht 5\' 4"  (1.626 m)  Wt 151 lb 10.8 oz (68.8 kg)  BMI 26.02 kg/m2  SpO2 98%  Discharge Labs:  Results for orders placed during the hospital encounter of 11/04/12 (from the past 24 hour(s))   CBC     Status: Abnormal   Collection Time    11/06/12  6:45 PM      Result Value Range   WBC 8.5  4.0 - 10.5 K/uL   RBC 2.38 (*) 3.87 - 5.11 MIL/uL   Hemoglobin 7.7 (*) 12.0 - 15.0 g/dL   HCT 16.1 (*) 09.6 - 04.5 %   MCV 93.3  78.0 - 100.0 fL   MCH 32.4  26.0 - 34.0 pg   MCHC 34.7  30.0 - 36.0 g/dL   RDW 40.9 (*) 81.1 - 91.4 %   Platelets 145 (*) 150 - 400 K/uL  CBC     Status: Abnormal   Collection Time    11/07/12  8:50 AM      Result Value Range   WBC 9.1  4.0 - 10.5 K/uL   RBC 2.38 (*) 3.87 - 5.11 MIL/uL   Hemoglobin 7.8 (*) 12.0 - 15.0 g/dL   HCT 78.2 (*) 95.6 - 21.3 %   MCV 93.7  78.0 - 100.0 fL   MCH 32.8  26.0 - 34.0 pg   MCHC 35.0  30.0 - 36.0 g/dL   RDW 08.6 (*) 57.8 - 46.9 %   Platelets 172  150 - 400 K/uL    Signed: Rocco Serene, MD 11/07/2012, 12:49 PM   Time Spent on Discharge: 40 minutes Services Ordered on Discharge: none Equipment Ordered on Discharge: none

## 2012-11-07 NOTE — Progress Notes (Signed)
  Date: 11/07/2012  Patient name: [name]  Medical record number: [id number]  Date of birth: 1970/05/02   This patient has been seen and the plan of care was discussed with the house staff. Please see their note for complete details. I concur with their findings with the following additions/corrections:  Feels well today. Agree with BID PPI, two additional days of oral cipro, and resume spironolactone and lasix as well as latulose and xifaxan.  She will need PCP follow up in 1-2 weeks. No further evidence of bleeding. Medically stable for D/C.  Jonah Blue, DO, FACP Faculty [facility] Internal Medicine Residency Program 11/07/2012, 2:28 PM

## 2012-11-07 NOTE — Care Management Note (Signed)
    Page 1 of 1   11/07/2012     3:25:08 PM   CARE MANAGEMENT NOTE 11/07/2012  Patient:  [name], [name]   Account Number:  [id number]  Date Initiated:  11/05/2012  Documentation initiated by:  [facility]  Subjective/Objective Assessment:   Upper GI bleed, esophageal varices s/p banding last month     Action/Plan:   lives with boyfriend   Anticipated DC Date:  11/07/2012   Anticipated DC Plan:  HOME/SELF CARE      DC Planning Services  CM consult      Choice offered to / List presented to:             Status of service:  Completed, signed off Medicare Important Message given?   (If response is "NO", the following Medicare IM given date fields will be blank) Date Medicare IM given:   Date Additional Medicare IM given:    Discharge Disposition:  HOME/SELF CARE  Per UR Regulation:  Reviewed for med. necessity/level of care/duration of stay  If discussed at Long Length of Stay Meetings, dates discussed:    Comments:  11/07/12 15:24 Letha Cape RN, BSN [phone] no needs anticipated.

## 2012-11-08 ENCOUNTER — Other Ambulatory Visit (HOSPITAL_COMMUNITY): Payer: Self-pay | Admitting: Internal Medicine

## 2012-11-08 ENCOUNTER — Telehealth: Payer: Self-pay | Admitting: *Deleted

## 2012-11-08 NOTE — Telephone Encounter (Signed)
Triage was notified by pt experience rn, heather, called triage and states pt called to complain she wasn't given part of her scripts. She states the furosemide and ultram were not given to her or called to her pharmacy. i called [facility], , verified that numerous prescribed meds were picked up 11/04 at 1648 for pt. Furosemide and ultram were 2 of the ones picked up. Triage nurse then called pt and she stated she only got 3 cipro, pharmacy states she was given 6, pt states maybe she dropped some, i ask her look for them and to call her pcp, dr Clovis Riley if she had a problem or she could always feel free to call the clinic. She also ask about thiamine and folic acid, she was informed insurance considers these over the counter meds and she could even buy them at the at the dollar store if money was tight or a [facility] brand, she is given the clinic ph# for future needs/ issuesshe states she has an appt for f/u at the doctor she has chosen. She states she has no other issues

## 2012-11-08 NOTE — Telephone Encounter (Signed)
Thanks for addressing this and verifying that the patient received appropriate prescriptions.

## 2012-11-11 NOTE — Discharge Summary (Signed)
  Date: 11/11/2012  Patient name: [name]  Medical record number: [id number]  Date of birth: 02-14-1970   This patient has been discussed with the house staff. Please see their note for complete details. I concur with their findings and plan.  Jonah Blue, DO, FACP Faculty [facility] Internal Medicine Residency Program 11/11/2012, 11:18 PM

## 2012-12-03 ENCOUNTER — Other Ambulatory Visit (HOSPITAL_COMMUNITY): Payer: Self-pay | Admitting: Internal Medicine

## 2013-01-30 ENCOUNTER — Inpatient Hospital Stay (HOSPITAL_COMMUNITY)
Admission: EM | Admit: 2013-01-30 | Discharge: 2013-02-01 | DRG: 442 | Disposition: A | Discharge status: Home / Self Care | Payer: Medicaid Other | Attending: Internal Medicine | Admitting: Internal Medicine

## 2013-01-30 ENCOUNTER — Encounter (HOSPITAL_COMMUNITY): Payer: Self-pay | Admitting: Emergency Medicine

## 2013-01-30 ENCOUNTER — Inpatient Hospital Stay (HOSPITAL_COMMUNITY): Payer: Medicaid Other

## 2013-01-30 DIAGNOSIS — Z79899 Other long term (current) drug therapy: Secondary | ICD-10-CM

## 2013-01-30 DIAGNOSIS — I8510 Secondary esophageal varices without bleeding: Secondary | ICD-10-CM

## 2013-01-30 DIAGNOSIS — K8309 Other cholangitis: Secondary | ICD-10-CM | POA: Insufficient documentation

## 2013-01-30 DIAGNOSIS — F329 Major depressive disorder, single episode, unspecified: Secondary | ICD-10-CM

## 2013-01-30 DIAGNOSIS — F17200 Nicotine dependence, unspecified, uncomplicated: Secondary | ICD-10-CM | POA: Diagnosis present

## 2013-01-30 DIAGNOSIS — K7030 Alcoholic cirrhosis of liver without ascites: Secondary | ICD-10-CM | POA: Diagnosis present

## 2013-01-30 DIAGNOSIS — D649 Anemia, unspecified: Secondary | ICD-10-CM | POA: Diagnosis present

## 2013-01-30 DIAGNOSIS — K7682 Hepatic encephalopathy: Principal | ICD-10-CM | POA: Diagnosis present

## 2013-01-30 DIAGNOSIS — R188 Other ascites: Secondary | ICD-10-CM | POA: Diagnosis present

## 2013-01-30 DIAGNOSIS — Z9119 Patient's noncompliance with other medical treatment and regimen: Secondary | ICD-10-CM

## 2013-01-30 DIAGNOSIS — F1020 Alcohol dependence, uncomplicated: Secondary | ICD-10-CM | POA: Diagnosis present

## 2013-01-30 DIAGNOSIS — F411 Generalized anxiety disorder: Secondary | ICD-10-CM | POA: Diagnosis present

## 2013-01-30 DIAGNOSIS — Z23 Encounter for immunization: Secondary | ICD-10-CM

## 2013-01-30 DIAGNOSIS — F3289 Other specified depressive episodes: Secondary | ICD-10-CM

## 2013-01-30 DIAGNOSIS — K7460 Unspecified cirrhosis of liver: Secondary | ICD-10-CM | POA: Diagnosis present

## 2013-01-30 DIAGNOSIS — Z91199 Patient's noncompliance with other medical treatment and regimen due to unspecified reason: Secondary | ICD-10-CM

## 2013-01-30 DIAGNOSIS — K7290 Hepatic failure, unspecified without coma: Principal | ICD-10-CM

## 2013-01-30 DIAGNOSIS — G9340 Encephalopathy, unspecified: Secondary | ICD-10-CM | POA: Diagnosis present

## 2013-01-30 DIAGNOSIS — N390 Urinary tract infection, site not specified: Secondary | ICD-10-CM | POA: Diagnosis present

## 2013-01-30 DIAGNOSIS — F419 Anxiety disorder, unspecified: Secondary | ICD-10-CM | POA: Diagnosis present

## 2013-01-30 DIAGNOSIS — K8301 Primary sclerosing cholangitis: Secondary | ICD-10-CM

## 2013-01-30 DIAGNOSIS — F410 Panic disorder [episodic paroxysmal anxiety] without agoraphobia: Secondary | ICD-10-CM | POA: Insufficient documentation

## 2013-01-30 DIAGNOSIS — R17 Unspecified jaundice: Secondary | ICD-10-CM

## 2013-01-30 DIAGNOSIS — F1010 Alcohol abuse, uncomplicated: Secondary | ICD-10-CM | POA: Diagnosis present

## 2013-01-30 DIAGNOSIS — F32A Depression, unspecified: Secondary | ICD-10-CM | POA: Diagnosis present

## 2013-01-30 DIAGNOSIS — R109 Unspecified abdominal pain: Secondary | ICD-10-CM

## 2013-01-30 LAB — ETHANOL

## 2013-01-30 LAB — CBC WITH DIFFERENTIAL/PLATELET
BASOS ABS: 0.1 10*3/uL (ref 0.0–0.1)
Basophils Relative: 1 % (ref 0–1)
Eosinophils Absolute: 0.2 10*3/uL (ref 0.0–0.7)
Eosinophils Relative: 3 % (ref 0–5)
HCT: 27.7 % — ABNORMAL LOW (ref 36.0–46.0)
Hemoglobin: 9 g/dL — ABNORMAL LOW (ref 12.0–15.0)
LYMPHS ABS: 1.2 10*3/uL (ref 0.7–4.0)
Lymphocytes Relative: 16 % (ref 12–46)
MCH: 30.6 pg (ref 26.0–34.0)
MCHC: 32.5 g/dL (ref 30.0–36.0)
MCV: 94.2 fL (ref 78.0–100.0)
MONO ABS: 0.9 10*3/uL (ref 0.1–1.0)
MONOS PCT: 13 % — AB (ref 3–12)
NEUTROS PCT: 67 % (ref 43–77)
Neutro Abs: 4.8 10*3/uL (ref 1.7–7.7)
PLATELETS: 145 10*3/uL — AB (ref 150–400)
RBC: 2.94 MIL/uL — ABNORMAL LOW (ref 3.87–5.11)
RDW: 21.9 % — AB (ref 11.5–15.5)
WBC: 7.2 10*3/uL (ref 4.0–10.5)

## 2013-01-30 LAB — COMPREHENSIVE METABOLIC PANEL
ALBUMIN: 2.3 g/dL — AB (ref 3.5–5.2)
ALT: 20 U/L (ref 0–35)
AST: 45 U/L — AB (ref 0–37)
Alkaline Phosphatase: 253 U/L — ABNORMAL HIGH (ref 39–117)
BUN: 15 mg/dL (ref 6–23)
CALCIUM: 9 mg/dL (ref 8.4–10.5)
CHLORIDE: 110 meq/L (ref 96–112)
CO2: 22 mEq/L (ref 19–32)
CREATININE: 0.59 mg/dL (ref 0.50–1.10)
GFR calc Af Amer: >90 mL/min (ref >90)
GFR calc non Af Amer: >90 mL/min (ref >90)
Glucose, Bld: 105 mg/dL — ABNORMAL HIGH (ref 70–99)
Potassium: 4.5 mEq/L (ref 3.7–5.3)
SODIUM: 143 meq/L (ref 137–147)
Total Bilirubin: 2.6 mg/dL — ABNORMAL HIGH (ref 0.3–1.2)
Total Protein: 6.7 g/dL (ref 6.0–8.3)

## 2013-01-30 LAB — PROTIME-INR
INR: 1.72 — ABNORMAL HIGH (ref 0.00–1.49)
PROTHROMBIN TIME: 19.7 s — AB (ref 11.6–15.2)

## 2013-01-30 LAB — CG4 I-STAT (LACTIC ACID): LACTIC ACID, VENOUS: 1.35 mmol/L (ref 0.5–2.2)

## 2013-01-30 LAB — TYPE AND SCREEN
ABO/RH(D): O NEG
Antibody Screen: NEGATIVE

## 2013-01-30 LAB — AMMONIA: Ammonia: 124 umol/L — ABNORMAL HIGH (ref 11–60)

## 2013-01-30 LAB — MRSA PCR SCREENING: MRSA by PCR: NEGATIVE

## 2013-01-30 LAB — OCCULT BLOOD, POC DEVICE: FECAL OCCULT BLD: NEGATIVE

## 2013-01-30 MED ORDER — FOLIC ACID 800 MCG PO TABS: 800.0000 ug | ORAL_TABLET | Freq: Every day | ORAL | Status: DC

## 2013-01-30 MED ORDER — LACTULOSE ENEMA
300.0000 mL | Freq: Three times a day (TID) | ORAL | Status: DC
  Administered 2013-01-30 (×2): 300 mL via RECTAL
  Filled 2013-01-30 (×6): qty 300

## 2013-01-30 MED ORDER — FOLIC ACID 1 MG PO TABS
1.0000 mg | ORAL_TABLET | Freq: Every day | ORAL | Status: DC
  Administered 2013-01-31 – 2013-02-01 (×2): 1 mg via ORAL
  Filled 2013-01-30 (×3): qty 1

## 2013-01-30 MED ORDER — SODIUM CHLORIDE 0.9 % IJ SOLN: 3.0000 mL | INTRAMUSCULAR | Status: DC | PRN

## 2013-01-30 MED ORDER — PANTOPRAZOLE SODIUM 40 MG PO TBEC
40.0000 mg | DELAYED_RELEASE_TABLET | Freq: Two times a day (BID) | ORAL | Status: DC
  Administered 2013-01-30 – 2013-02-01 (×4): 40 mg via ORAL
  Filled 2013-01-30 (×4): qty 1

## 2013-01-30 MED ORDER — SODIUM CHLORIDE 0.9 % IV BOLUS (SEPSIS)
500.0000 mL | Freq: Once | INTRAVENOUS | Status: AC
  Administered 2013-01-30: 500 mL via INTRAVENOUS

## 2013-01-30 MED ORDER — DEXTROSE 5 % IV SOLN
2.0000 g | Freq: Three times a day (TID) | INTRAVENOUS | Status: DC
  Administered 2013-01-30 – 2013-01-31 (×4): 2 g via INTRAVENOUS
  Filled 2013-01-30 (×6): qty 2

## 2013-01-30 MED ORDER — WOMENS ONE DAILY PO TABS: 1.0000 | ORAL_TABLET | Freq: Every day | ORAL | Status: DC

## 2013-01-30 MED ORDER — NICOTINE 21 MG/24HR TD PT24
21.0000 mg | MEDICATED_PATCH | Freq: Every day | TRANSDERMAL | Status: DC
  Administered 2013-01-31: 21 mg via TRANSDERMAL
  Filled 2013-01-30 (×4): qty 1

## 2013-01-30 MED ORDER — SODIUM CHLORIDE 0.9 % IV SOLN
INTRAVENOUS | Status: DC
  Administered 2013-01-30: 18:00:00 via INTRAVENOUS
  Administered 2013-01-31: 125 mL/h via INTRAVENOUS

## 2013-01-30 MED ORDER — PROPRANOLOL HCL 20 MG PO TABS
20.0000 mg | ORAL_TABLET | Freq: Every day | ORAL | Status: DC
  Administered 2013-01-31 – 2013-02-01 (×2): 20 mg via ORAL
  Filled 2013-01-30 (×3): qty 1

## 2013-01-30 MED ORDER — SODIUM CHLORIDE 0.9 % IV BOLUS (SEPSIS)
1000.0000 mL | Freq: Once | INTRAVENOUS | Status: AC
  Administered 2013-01-30: 1000 mL via INTRAVENOUS

## 2013-01-30 MED ORDER — RIFAXIMIN 550 MG PO TABS
550.0000 mg | ORAL_TABLET | Freq: Two times a day (BID) | ORAL | Status: DC
  Administered 2013-01-30 – 2013-02-01 (×4): 550 mg via ORAL
  Filled 2013-01-30 (×6): qty 1

## 2013-01-30 MED ORDER — VITAMIN B-1 100 MG PO TABS
100.0000 mg | ORAL_TABLET | Freq: Every day | ORAL | Status: DC
  Administered 2013-01-31 – 2013-02-01 (×2): 100 mg via ORAL
  Filled 2013-01-30 (×3): qty 1

## 2013-01-30 MED ORDER — URSODIOL 300 MG PO CAPS
300.0000 mg | ORAL_CAPSULE | Freq: Two times a day (BID) | ORAL | Status: DC
  Administered 2013-01-30 – 2013-02-01 (×4): 300 mg via ORAL
  Filled 2013-01-30 (×6): qty 1

## 2013-01-30 MED ORDER — HEPARIN SODIUM (PORCINE) 5000 UNIT/ML IJ SOLN
5000.0000 [IU] | Freq: Three times a day (TID) | INTRAMUSCULAR | Status: DC
  Administered 2013-01-30 – 2013-02-01 (×6): 5000 [IU] via SUBCUTANEOUS
  Filled 2013-01-30 (×9): qty 1

## 2013-01-30 MED ORDER — TRAMADOL HCL 50 MG PO TABS
50.0000 mg | ORAL_TABLET | Freq: Four times a day (QID) | ORAL | Status: DC | PRN
  Administered 2013-01-31 – 2013-02-01 (×4): 50 mg via ORAL
  Filled 2013-01-30 (×4): qty 1

## 2013-01-30 MED ORDER — SODIUM CHLORIDE 0.9 % IJ SOLN: 3.0000 mL | Freq: Two times a day (BID) | INTRAMUSCULAR | Status: DC

## 2013-01-30 MED ORDER — INFLUENZA VAC SPLIT QUAD 0.5 ML IM SUSP
0.5000 mL | INTRAMUSCULAR | Status: AC
  Administered 2013-01-31: 0.5 mL via INTRAMUSCULAR
  Filled 2013-01-30: qty 0.5

## 2013-01-30 MED ORDER — SODIUM CHLORIDE 0.9 % IJ SOLN
3.0000 mL | Freq: Two times a day (BID) | INTRAMUSCULAR | Status: DC
  Administered 2013-01-30: 3 mL via INTRAVENOUS

## 2013-01-30 MED ORDER — BUPROPION HCL ER (XL) 150 MG PO TB24
150.0000 mg | ORAL_TABLET | Freq: Every day | ORAL | Status: DC
  Administered 2013-01-31 – 2013-02-01 (×2): 150 mg via ORAL
  Filled 2013-01-30 (×3): qty 1

## 2013-01-30 MED ORDER — SODIUM CHLORIDE 0.9 % IV SOLN: 250.0000 mL | INTRAVENOUS | Status: DC | PRN

## 2013-01-30 MED ORDER — ADULT MULTIVITAMIN W/MINERALS CH
1.0000 | ORAL_TABLET | Freq: Every day | ORAL | Status: DC
  Administered 2013-01-31 – 2013-02-01 (×2): 1 via ORAL
  Filled 2013-01-30 (×3): qty 1

## 2013-01-30 NOTE — H&P (Signed)
Hospital Admission Note Date: 01/30/2013  Patient name: [name] Medical record number: [id number] Date of birth: 05/17/1970 Age: 43 y.o. Gender: female PCP: No primary provider on file.  Medical Service: IMTS  Attending physician: Dr. Meredith PelJoines  Internal Medicine Teaching Service Contact Information  Weekday Hours (7AM-5PM):   1st contact:  Dr. Darci Needlehikowski               Pgr:  [phone] 2nd contact:  Dr. Darden PalmerSamaya Qureshi    pgr:  [phone]:  [phone]  ** If no return call within 15 minutes (after trying both pagers listed above), please call after hours pagers.   After 5 pm or weekends: 1st Contact: Pager: [phone] 2nd Contact: Pager: [phone]  Chief Complaint: abdominal pain, dysuria and AMS  History of Present Illness:  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   Patient is accompanied by her mother. History was obtained from both patient and patient's mother. Patient's mother states she has been in outpatient rehabilitation program since 11/03/12. She did not drink alcohol since then. She was supposed to take Lasix, spironolactone, rifaximin and lactulose for her cirrhosis, but she did not take lactulose for long time due to side effects of nausea. She was feeling her normal self until 3 days ago when she began having abdominal pain, dysuria and burning sensation on urination. Because patient is taking Lasix, it is not sure whether she has increased urinary frequency. Patient's abdominal pain is mainly located at the lower abdomen. No nausea, vomiting or diarrhea. No gross hematuria.  Per patient's mother, She seems to have been mildly confused in the past 7-10 days, but she could still take her medications. Her mental status has been progressively worsening. This morning she was found to be very confused and lethargic. She could not answer  questions appropriately. Patient does not have fever or chills. Patient is generally weak, but does not have unilateral weakness in extremities. Patient was found to have ammonia level of 124, and was suspected to have hepatic encephalopathy by ED.   ROS:  Pt is lethargic. has generalized weakness. Denies fever, chills, headaches, cough, chest pain, SOB, diarrhea, constipation, joint pain or leg swelling. No unilateral weakness in extremities.  Meds: Current Outpatient Rx  Name  Route  Sig  Dispense  Refill  . buPROPion (WELLBUTRIN XL) 150 MG 24 hr tablet   Oral   Take 150 mg by mouth daily.         . folic acid (FOLVITE) 800 MCG tablet   Oral   Take 800 mcg by mouth daily.         . furosemide (LASIX) 20 MG tablet   Oral   Take 1 tablet (20 mg total) by mouth 2 (two) times daily.   20 tablet   0   . lactulose (CHRONULAC) 10 GM/15ML solution   Oral   Take 20 g by mouth 2 (two) times daily.         . Multiple Vitamins-Minerals (WOMENS ONE DAILY) TABS   Oral   Take 1 tablet by mouth daily.         . pantoprazole (PROTONIX) 40 MG tablet   Oral   Take 40 mg by mouth 2 (two) times daily.         . potassium chloride 20 MEQ/15ML (10%) solution   Oral   Take  20 mEq by mouth 2 (two) times daily.         . propranolol (INDERAL) 20 MG tablet   Oral   Take 20 mg by mouth daily.         . rifaximin (XIFAXAN) 550 MG TABS tablet   Oral   Take 550 mg by mouth 2 (two) times daily.         Marland Kitchen spironolactone (ALDACTONE) 100 MG tablet   Oral   Take 100 mg by mouth daily.         Marland Kitchen thiamine (VITAMIN B-1) 100 MG tablet   Oral   Take 100 mg by mouth daily.         . traMADol (ULTRAM) 50 MG tablet   Oral   Take 50 mg by mouth every 6 (six) hours as needed for pain.         . traMADol (ULTRAM) 50 MG tablet   Oral   Take 1 tablet (50 mg total) by mouth every 6 (six) hours as needed for moderate pain.   28 tablet   0   . ursodiol (ACTIGALL) 300 MG capsule    Oral   Take 300 mg by mouth 2 (two) times daily.           Allergies: Allergies as of 01/30/2013  . (No Known Allergies)   Past Medical History  Diagnosis Date  . Sclerosing cholangitis   . Panic attacks   . Anxiety   . Alcohol abuse   . Ascites   . Cirrhosis of liver   . EtOH dependence 08/31/2012  . Family history of anesthesia complication     " my son had problems "   Past Surgical History  Procedure Laterality Date  . None    . Ercp    . Esophagogastroduodenoscopy N/A 09/10/2012    Procedure: ESOPHAGOGASTRODUODENOSCOPY (EGD);  Surgeon: Florencia Reasons, MD;  Location: [facility] ENDOSCOPY;  Service: Endoscopy;  Laterality: N/A;  . Esophagogastroduodenoscopy N/A 11/04/2012    Procedure: ESOPHAGOGASTRODUODENOSCOPY (EGD);  Surgeon: Barrie Folk, MD;  Location: [facility] ENDOSCOPY;  Service: Endoscopy;  Laterality: N/A;   Family History  Problem Relation Age of Onset  . CAD Father    History   Social History  . Marital Status: Single    Spouse Name: N/A    Number of Children: N/A  . Years of Education: N/A   Occupational History  . Not on file.   Social History Main Topics  . Smoking status: Current Every Day Smoker -- 1.00 packs/day for 20 years    Types: Cigarettes  . Smokeless tobacco: Never Used  . Alcohol Use: Yes     Comment: Pt drinks vodka/liquor every weekend, denies daily drinking...states she quit a month ago....  . Drug Use: Yes    Special: Marijuana  . Sexual Activity: Yes    Birth Control/ Protection: IUD   Other Topics Concern  . Not on file   Social History Narrative  . No narrative on file    Review of Systems: Full 14-point review of systems otherwise negative except as noted above in HPI.  Physical Exam:   Filed Vitals:   01/30/13 0800 01/30/13 0830 01/30/13 0900 01/30/13 0943  BP: 90/43 110/54 101/57 117/54  Pulse: 89 87 86 87  Temp:      TempSrc:      Resp: 13 14 20 18   SpO2: 100% 100% 100% 100%    General: Drowsy, but arousable, with  altered mental status, partially  followed commands on physical examination HEENT: PERRL, EOMI, no scleral icterus, No JVD or bruit Cardiac: S1/S2, RRR, No murmurs, gallops or rubs Pulm: Good air movement bilaterally. Clear to auscultation bilaterally. No rales, wheezing, rhonchi or rubs. Abd: Distended, and tender over RUQ and lower abdomen, no rebound pain, no organomegaly, BS present.  no CVA tenderness.  Ext: No edema. 2+DP/PT pulse bilaterally Musculoskeletal: No joint deformities, erythema, or stiffness, ROM full. Patient has asterixis? Skin: No rashes.  Neuro: disoriented X3, cranial nerves II-XII grossly intact, muscle strength 5/5 in all extremities. Sensation to light touch could not be tested. Brachial reflex 1+ bilaterally. Knee reflex 1+ bilaterally. Negative Babinski's sign. finger to nose test could not be tested.  Psych: Patient is not psychotic, no suicidal or hemocidal ideation.  Lab results: Basic Metabolic Panel:  Recent Labs  95/28/41 0700  NA 143  K 4.5  CL 110  CO2 22  GLUCOSE 105*  BUN 15  CREATININE 0.59  CALCIUM 9.0   Liver Function Tests:  Recent Labs  01/30/13 0700  AST 45*  ALT 20  ALKPHOS 253*  BILITOT 2.6*  PROT 6.7  ALBUMIN 2.3*   No results found for this basename: LIPASE, AMYLASE,  in the last 72 hours  Recent Labs  01/30/13 0737  AMMONIA 124*   CBC:  Recent Labs  01/30/13 0700  WBC 7.2  NEUTROABS 4.8  HGB 9.0*  HCT 27.7*  MCV 94.2  PLT 145*   Cardiac Enzymes: No results found for this basename: CKTOTAL, CKMB, CKMBINDEX, TROPONINI,  in the last 72 hours BNP: No results found for this basename: PROBNP,  in the last 72 hours D-Dimer: No results found for this basename: DDIMER,  in the last 72 hours CBG: No results found for this basename: GLUCAP,  in the last 72 hours Hemoglobin A1C: No results found for this basename: HGBA1C,  in the last 72 hours Fasting Lipid Panel: No results found for this basename: CHOL, HDL,  LDLCALC, TRIG, CHOLHDL, LDLDIRECT,  in the last 72 hours Thyroid Function Tests: No results found for this basename: TSH, T4TOTAL, FREET4, T3FREE, THYROIDAB,  in the last 72 hours Anemia Panel: No results found for this basename: VITAMINB12, FOLATE, FERRITIN, TIBC, IRON, RETICCTPCT,  in the last 72 hours Coagulation:  Recent Labs  01/30/13 0700  LABPROT 19.7*  INR 1.72*   Urine Drug Screen: Drugs of Abuse     Component Value Date/Time   LABOPIA NONE DETECTED 08/31/2012 0500   COCAINSCRNUR NONE DETECTED 08/31/2012 0500   LABBENZ NONE DETECTED 08/31/2012 0500   AMPHETMU NONE DETECTED 08/31/2012 0500   THCU NONE DETECTED 08/31/2012 0500   LABBARB NONE DETECTED 08/31/2012 0500    Alcohol Level:  Recent Labs  01/30/13 0700  ETH <11   Urinalysis: No results found for this basename: COLORURINE, APPERANCEUR, LABSPEC, PHURINE, GLUCOSEU, HGBUR, BILIRUBINUR, KETONESUR, PROTEINUR, UROBILINOGEN, NITRITE, LEUKOCYTESUR,  in the last 72 hours Misc. Labs: Imaging results:  No results found.  Other results:  Assessment & Plan by Problem: Active Problems:  #: Hepatic encephalopathy:  Patient's altered mental status is most likely caused by hepatic encephalopathy. In consistent with this diagnosis, patient has history of alcoholic cirrhosis, hepatic encephalopathy and elevated ammonia level (though this is not diagnostic). In addition, patient has not been compliant to her medication, lactulose for long time. On admission, patient has symptoms for UTI, which is likely the triggering factor for hepatic encephalopathy. Patient also complains of having abdominal pain, indicating possible SBP, which cannot be  ruled out at this moment. Other DD include, but less likely alcohol intoxication or withdrawal(patient has been rehabilitation program, has not been drinking since 11/03/12), stroke (unlikely given no focal neurological findings or unilateral weakness in extremities).  - will admit to step down  -  Start lactulose per rectal 30 mg tid - start cefotaxime which should cover both UTI and possible SBP - Diagnostic Paracentesis to rule out SBP. - continue rifaximine - Blood culture X 2 - urine culture  - check, UDS  - if patient does not respond to lactulose, will consider head image.  #: UTI:  Patient has symptoms for UTI, including dysuria, burning on urination. Currently no fever or chills. Patient's blood pressure is soft on admission.  -will hold her Lasix and spironolactone, but continue beta blocker  -will give IVF: NS 125 cc/h -will give NS bolus 1000 ml now, and repeat if needed.  -urinalysis and urine culture  -Patient is on cefotaxime IV as above.   # Hx of Upper GI bleed- Patient has history of alcoholic cirrhosis with ascites and esophageal varices s/p banding. Her FOBT is negative on admission. Hgb is stable. Hgb 7.8 on 11/07/12-->9.0 today.  -Continue Inderal 20 mg daily -follow up CBC -continue Protonix.  # Alcochol abuse and cirrhosis:  patient has been in outpatient rehabilitation program since 11/03/12. She did not drink alcohol since then. Her EtOH level <11 on amdission. AST 45 and ALT 20 with total bilirubin 2.6 on admission. INR 1.72.  - continue home folate and Vb1 -will follow CMP for liver function -IVF as above  #: depression: continue:  Home Wellbutrin.   #Tobacco abuse- 1PPD x many years, currently half pack a day.  -smoking cessation counseling -nicotine patch if pt interested  #F/E/N  -NS: IV 125 cc/hr   -Electrolytes: f/u CMP   -NPO until SLP done  # DVT px: Heparin sq  Dispo: Disposition is deferred at this time, awaiting improvement of current medical problems. Anticipated discharge in approximately 2 to 3 day(s).   The patient does not have a current PCP (No primary provider on file.), therefore is not requiring OPC follow-up after discharge.   The patient does not have transportation limitations that hinder transportation to clinic  appointments.  Signed:  Lorretta Harp, MD PGY3, Internal Medicine Teaching Service Pager: [phone]  01/30/2013, 10:32 AM

## 2013-01-30 NOTE — Progress Notes (Signed)
43 yo female admitted via stretcher from ED with diagnosis of hepatic encephalopathy.  Mother in and states she is an alcoholic and has been sober since the beginning of November.  She attends an outpatient rehabilitation program.  She is listless and lethargic at this time and awakens to the sound of my voice.  She is not able to answer questions appropriately and often will answer questions with "1-2-3", regardless if the question requires a numerical answer or not.  She is disoriented to place, time, and situation.  Abdomen is distended with a questionable fluid wave presence noted.  Attempted to orient patient to surroundings without effect due to confusion.  Mother in attendance briefly, but states patient's boyfriend will be here tonight to answer more questions.  Safety efforts in effect and bed in low position with bed alarm on.  Call bell in reach.  Will monitor closely.

## 2013-01-30 NOTE — ED Notes (Signed)
Admitting MD at bedside.

## 2013-01-30 NOTE — ED Notes (Signed)
Lactic acid results shown to Dr. Genene ChurnM. James

## 2013-01-30 NOTE — ED Notes (Signed)
Per patients husband, patient woke up early this morning and was disoriented x4 and was arguing with her mother. Patient has a hx of liver problems and has had elevated ammonia levels in the past.

## 2013-01-30 NOTE — Progress Notes (Signed)
SLP Cancellation Note  Patient Details Name: [name] MRN: [id number] DOB: 09/01/1970   Cancelled swallow eval:   Pt obtunded.  Will return next date for swallow eval.        Blenda MountsCouture, Blue Ruggerio Laurice 01/30/2013, 5:18 PM

## 2013-01-30 NOTE — ED Notes (Signed)
Report received, assumed care.  

## 2013-01-31 DIAGNOSIS — F411 Generalized anxiety disorder: Secondary | ICD-10-CM

## 2013-01-31 DIAGNOSIS — K8309 Other cholangitis: Secondary | ICD-10-CM

## 2013-01-31 DIAGNOSIS — D649 Anemia, unspecified: Secondary | ICD-10-CM

## 2013-01-31 DIAGNOSIS — F1010 Alcohol abuse, uncomplicated: Secondary | ICD-10-CM

## 2013-01-31 LAB — COMPREHENSIVE METABOLIC PANEL
ALBUMIN: 2.1 g/dL — AB (ref 3.5–5.2)
ALK PHOS: 195 U/L — AB (ref 39–117)
ALT: 20 U/L (ref 0–35)
AST: 49 U/L — ABNORMAL HIGH (ref 0–37)
BILIRUBIN TOTAL: 3 mg/dL — AB (ref 0.3–1.2)
BUN: 10 mg/dL (ref 6–23)
CHLORIDE: 108 meq/L (ref 96–112)
CO2: 18 mEq/L — ABNORMAL LOW (ref 19–32)
Calcium: 8.9 mg/dL (ref 8.4–10.5)
Creatinine, Ser: 0.51 mg/dL (ref 0.50–1.10)
GFR calc Af Amer: >90 mL/min (ref >90)
GFR calc non Af Amer: >90 mL/min (ref >90)
Glucose, Bld: 87 mg/dL (ref 70–99)
POTASSIUM: 4.3 meq/L (ref 3.7–5.3)
SODIUM: 138 meq/L (ref 137–147)
TOTAL PROTEIN: 6.1 g/dL (ref 6.0–8.3)

## 2013-01-31 LAB — CBC
HEMATOCRIT: 26.6 % — AB (ref 36.0–46.0)
Hemoglobin: 8.6 g/dL — ABNORMAL LOW (ref 12.0–15.0)
MCH: 30.8 pg (ref 26.0–34.0)
MCHC: 32.3 g/dL (ref 30.0–36.0)
MCV: 95.3 fL (ref 78.0–100.0)
Platelets: 141 10*3/uL — ABNORMAL LOW (ref 150–400)
RBC: 2.79 MIL/uL — AB (ref 3.87–5.11)
RDW: 21.8 % — ABNORMAL HIGH (ref 11.5–15.5)
WBC: 5.1 10*3/uL (ref 4.0–10.5)

## 2013-01-31 LAB — URINALYSIS, ROUTINE W REFLEX MICROSCOPIC
BILIRUBIN URINE: NEGATIVE
Glucose, UA: NEGATIVE mg/dL
Hgb urine dipstick: NEGATIVE
KETONES UR: NEGATIVE mg/dL
Nitrite: NEGATIVE
PH: 5.5 (ref 5.0–8.0)
Protein, ur: NEGATIVE mg/dL
SPECIFIC GRAVITY, URINE: 1.02 (ref 1.005–1.030)
Urobilinogen, UA: 0.2 mg/dL (ref 0.0–1.0)

## 2013-01-31 LAB — RAPID URINE DRUG SCREEN, HOSP PERFORMED
Amphetamines: NOT DETECTED
BENZODIAZEPINES: NOT DETECTED
Barbiturates: NOT DETECTED
COCAINE: NOT DETECTED
OPIATES: NOT DETECTED
TETRAHYDROCANNABINOL: NOT DETECTED

## 2013-01-31 LAB — URINE MICROSCOPIC-ADD ON

## 2013-01-31 LAB — PROTIME-INR
INR: 1.63 — ABNORMAL HIGH (ref 0.00–1.49)
Prothrombin Time: 18.9 seconds — ABNORMAL HIGH (ref 11.6–15.2)

## 2013-01-31 MED ORDER — LACTULOSE 10 GM/15ML PO SOLN
20.0000 g | Freq: Two times a day (BID) | ORAL | Status: DC
  Administered 2013-01-31 – 2013-02-01 (×3): 20 g via ORAL
  Filled 2013-01-31 (×5): qty 30

## 2013-01-31 MED ORDER — PROMETHAZINE HCL 12.5 MG PO TABS
12.5000 mg | ORAL_TABLET | Freq: Once | ORAL | Status: AC
  Administered 2013-01-31: 12.5 mg via ORAL
  Filled 2013-01-31: qty 1

## 2013-01-31 NOTE — Progress Notes (Signed)
Subjective: Patient is completely alert and oriented this morning. She is talkative and fully communicative. She has no complaints (no dysuria, abd pain, CP, SOB). She reports her lack of compliance with lactulose is 2/2 nausea and the taste of the medication. I discussed with her them importance of taking this medication and she seems in good understanding. Her GI physician is Dr. Bosie Clos.  Objective: Vital signs in last 24 hours: Filed Vitals:   01/31/13 0800 01/31/13 0900 01/31/13 1000 01/31/13 1100  BP: 108/53 104/37 103/48 100/50  Pulse: 88 78 80 74  Temp:    97.5 F (36.4 C)  TempSrc:    Oral  Resp: 10 11  9   Height:      Weight:      SpO2: 99% 100%  100%   Weight change:   Intake/Output Summary (Last 24 hours) at 01/31/13 1140 Last data filed at 01/31/13 1000  Gross per 24 hour  Intake 2958.42 ml  Output    700 ml  Net 2258.42 ml   Physical Exam General: alert, cooperative, and in no apparent distress HEENT: pupils equal round and reactive to light, vision grossly intact, oropharynx clear and non-erythematous  Neck: supple Lungs: clear to ascultation bilaterally, normal work of respiration, no wheezes, rales, ronchi Heart: regular rate and rhythm, no murmurs, gallops, or rubs Abdomen: soft, protuberant, non-tender, non-distended, normal bowel sounds Extremities: warm extremities, no pedal edema Neurologic: alert & oriented X3, appropriate speech, cranial nerves II-XII intact, strength 5/5 throughout, sensation intact to light touch  Lab Results: Basic Metabolic Panel:  Recent Labs Lab 01/30/13 0700 01/31/13 0335  NA 143 138  K 4.5 4.3  CL 110 108  CO2 22 18*  GLUCOSE 105* 87  BUN 15 10  CREATININE 0.59 0.51  CALCIUM 9.0 8.9   Liver Function Tests:  Recent Labs Lab 01/30/13 0700 01/31/13 0335  AST 45* 49*  ALT 20 20  ALKPHOS 253* 195*  BILITOT 2.6* 3.0*  PROT 6.7 6.1  ALBUMIN 2.3* 2.1*    Recent Labs Lab 01/30/13 0737  AMMONIA 124*    CBC:  Recent Labs Lab 01/30/13 0700 01/31/13 0335  WBC 7.2 5.1  NEUTROABS 4.8  --   HGB 9.0* 8.6*  HCT 27.7* 26.6*  MCV 94.2 95.3  PLT 145* 141*   Coagulation:  Recent Labs Lab 01/30/13 0700 01/31/13 0335  LABPROT 19.7* 18.9*  INR 1.72* 1.63*   Urine Drug Screen: Drugs of Abuse     Component Value Date/Time   LABOPIA NONE DETECTED 01/31/2013 1030   COCAINSCRNUR NONE DETECTED 01/31/2013 1030   LABBENZ NONE DETECTED 01/31/2013 1030   AMPHETMU NONE DETECTED 01/31/2013 1030   THCU NONE DETECTED 01/31/2013 1030   LABBARB NONE DETECTED 01/31/2013 1030    Alcohol Level:  Recent Labs Lab 01/30/13 0700  ETH <11   Urinalysis: No results found for this basename: COLORURINE, APPERANCEUR, LABSPEC, PHURINE, GLUCOSEU, HGBUR, BILIRUBINUR, KETONESUR, PROTEINUR, UROBILINOGEN, NITRITE, LEUKOCYTESUR,  in the last 168 hours  Micro Results: Recent Results (from the past 240 hour(s))  MRSA PCR SCREENING     Status: None   Collection Time    01/30/13  1:47 PM      Result Value Range Status   MRSA by PCR NEGATIVE  NEGATIVE Final   Comment:            The GeneXpert MRSA Assay (FDA     approved for NASAL specimens     only), is one component of a  comprehensive MRSA colonization     surveillance program. It is not     intended to diagnose MRSA     infection nor to guide or     monitor treatment for     MRSA infections.  CULTURE, BLOOD (ROUTINE X 2)     Status: None   Collection Time    01/30/13  2:48 PM      Result Value Range Status   Specimen Description BLOOD LEFT ARM   Final   Special Requests BOTTLES DRAWN AEROBIC ONLY 10CC   Final   Culture  Setup Time     Final   Value: 01/30/2013 20:25     Performed at [facility]   Culture     Final   Value:        BLOOD CULTURE RECEIVED NO GROWTH TO DATE CULTURE WILL BE HELD FOR 5 DAYS BEFORE ISSUING A FINAL NEGATIVE REPORT     Performed at [facility]   Report Status PENDING   Incomplete  CULTURE, BLOOD  (ROUTINE X 2)     Status: None   Collection Time    01/30/13  3:00 PM      Result Value Range Status   Specimen Description BLOOD RIGHT HAND   Final   Special Requests BOTTLES DRAWN AEROBIC ONLY 10CC   Final   Culture  Setup Time     Final   Value: 01/30/2013 20:25     Performed at [facility]   Culture     Final   Value:        BLOOD CULTURE RECEIVED NO GROWTH TO DATE CULTURE WILL BE HELD FOR 5 DAYS BEFORE ISSUING A FINAL NEGATIVE REPORT     Performed at [facility]   Report Status PENDING   Incomplete   Studies/Results: US Abdomen Limited  01/30/2013   CLINICAL DATA:  43 year old female with altered mental status. History of cirrhosis and recurrent ascites. Initial encounter.  EXAM: US ABDOMEN LIMITED - ascites  COMPARISON:  09/14/2012 and earlier.  FINDINGS: Small volume of free fluid in the left upper and lower quadrants, quantity insufficient for paracentesis. No definite additional free fluid.  IMPRESSION: Trace ascites, quantity insufficient for paracentesis.   Electronically Signed   By: Augusto Gamble M.D.   On: 01/30/2013 15:51   Medications: I have reviewed the patient's current medications. Scheduled Meds: . buPROPion  150 mg Oral Daily  . cefoTAXime (CLAFORAN) IV  2 g Intravenous Q8H  . folic acid  1 mg Oral Daily  . heparin  5,000 Units Subcutaneous Q8H  . lactulose  20 g Oral BID  . multivitamin with minerals  1 tablet Oral Daily  . nicotine  21 mg Transdermal Daily  . pantoprazole  40 mg Oral BID  . propranolol  20 mg Oral Daily  . rifaximin  550 mg Oral BID  . sodium chloride  3 mL Intravenous Q12H  . thiamine  100 mg Oral Daily  . ursodiol  300 mg Oral BID   Continuous Infusions:  PRN Meds:.sodium chloride, sodium chloride, traMADol  Assessment/Plan: # Hepatic encephalopathy: Patient's mental status is back at baseline after receiving lactulose 30g twice overnight, 1L NS bolus and NS at 125cc/hr overnight. She is A&O x 3. BP remains soft (104/37)  though unknown baseline blood pressure. I discussed with patient the importance of compliance w/ lactulose at home. She understands, though I do not know if she will be compliant. Patient denies all abd pain,  dysuria, increased urinary frequency--I am unsure if she did in fact have these symptoms prior to her arrival as the hx was provided by her mother. I am not convinced that patient has SBP as she is completely asymptomatic now and is back to her baseline. Diagnostic paracentesis was not performed as patient did not have enough ascitic fluid. She was evaluated by SLP and has been given a regular diet. UDS was negative. -change lactulose to 20g BID orally -continue cefotaxime 2g q8h until UA comes back, if it is clean, we will discontinue  -continue rifaximin 550mg  BID, ursodiol, protonix, propranolol -f/u BCx x 2- NGTD -f/u UCx -ambulate patient today -transfer to med surg unit -d/c IVF  # Alcochol abuse and cirrhosis w/ hx of upper GI bleed: Patient has history of alcoholic cirrhosis with ascites and esophageal varices s/p banding. Hemoglobin remains stable. No concern for active bleeding and FOBT negative. Patient has been in outpatient alcohol rehabilitation program since 11/03/12. She did not drink alcohol since then. LFTs stable from yesterday. -continue protonix, Inderal 20mg  daily -continue home folate and Vb1   # Depression: Mood appears stable. Continue home Wellbutrin.   # DVT px: Heparin sq  Dispo: Disposition is deferred at this time, awaiting improvement of current medical problems.  Anticipated discharge in approximately 1 day(s).   The patient does not have a current PCP (No primary provider on file.) and [facility].  The patient does not have transportation limitations that hinder transportation to clinic appointments.  .Services Needed at time of discharge: Y = Yes, Blank = No PT:   OT:   RN:   Equipment:   Other:       LOS: 1 day   Windell Hummingbirdachel Shaneya Taketa, MD 01/31/2013, 11:40 AM

## 2013-01-31 NOTE — Evaluation (Signed)
Clinical/Bedside Swallow Evaluation Patient Details  Name: [name] MRN: [id number] Date of Birth: 1970-07-08  Today's Date: 01/31/2013 Time: 1120-1130 SLP Time Calculation (min): 10 min  Past Medical History:  Past Medical History  Diagnosis Date  . Sclerosing cholangitis   . Panic attacks   . Anxiety   . Alcohol abuse   . Ascites   . Cirrhosis of liver   . EtOH dependence 08/31/2012  . Family history of anesthesia complication     " my son had problems "   Past Surgical History:  Past Surgical History  Procedure Laterality Date  . None    . Ercp    . Esophagogastroduodenoscopy N/A 09/10/2012    Procedure: ESOPHAGOGASTRODUODENOSCOPY (EGD);  Surgeon: Florencia Reasonsobert V Buccini, MD;  [facility];  Service: Endoscopy;  Laterality: N/A;  . Esophagogastroduodenoscopy N/A 11/04/2012    Procedure: ESOPHAGOGASTRODUODENOSCOPY (EGD);  Surgeon: Barrie FolkJohn C Hayes, MD;  [facility];  Service: Endoscopy;  Laterality: N/A;   HPI:  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 Pt suspected to have UTI with hepatic encephalopathy.    Assessment / Plan / Recommendation Clinical Impression  Pt fully alert and appropriate. Swallow WNL. No SLP f/u needed, continue regular diet and thin liquids.     Aspiration Risk  None    Diet Recommendation Regular;Thin liquid   Liquid Administration via: Cup;Straw Medication Administration: Whole meds with liquid Supervision: Patient able to self feed    Other  Recommendations Oral Care Recommendations: Patient independent with oral care   Follow Up Recommendations  None    Frequency and Duration        Pertinent Vitals/Pain NA    SLP Swallow Goals     Swallow Study Prior Functional Status       General HPI: 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 Pt suspected to have UTI with hepatic encephalopathy.  Type of Study: Bedside swallow evaluation Diet Prior to this Study: Regular;Thin liquids Temperature Spikes Noted: No Respiratory Status: Room air History of Recent Intubation: No Behavior/Cognition: Alert;Cooperative;Pleasant mood Oral Cavity - Dentition: Adequate natural dentition Self-Feeding Abilities: Able to feed self Patient Positioning: Upright in bed Baseline Vocal Quality: Clear Volitional Cough: Strong Volitional Swallow: Able to elicit    Oral/Motor/Sensory Function Overall Oral Motor/Sensory Function: Appears within functional limits for tasks assessed   Ice Chips     Thin Liquid Thin Liquid: Within functional limits Presentation: Cup;Straw;Self Fed    Nectar Thick Nectar Thick Liquid: Not tested   Honey Thick Honey Thick Liquid: Not tested   Puree Puree: Within functional limits   Solid   GO    Solid: Within functional limits      [facility] CenterBonnie Bedford Winsor, MA CCC-SLP [phone]  [name], Riley NearingBonnie Caroline 01/31/2013,11:33 AM

## 2013-01-31 NOTE — H&P (Signed)
Internal Medicine Attending Admission Note Date: 01/31/2013  Patient name: [name] Medical record number: [id number] Date of birth: [birth year] Age: 43 y.o. Gender: female  I saw and evaluated the patient. I reviewed the resident's note and I agree with the resident's findings and plan as documented in the resident's note, with the following additional comments.  Chief Complaint(s): Altered mental status  History - key components related to admission: Patient is a 43 year old woman with history of cirrhosis with ascites, esophageal varices, alcohol abuse, hepatic encephalopathy, sclerosing cholangitis, and other problems as outlined in the medical history, admitted with altered mental status.  She reports that she had not been taking lactulose recently.   Physical Exam - key components related to admission:  Filed Vitals:   01/31/13 1100 01/31/13 1200 01/31/13 1300 01/31/13 1400  BP: 100/50 106/49 108/48 90/68  Pulse: 74 73 78 83  Temp: 97.5 F (36.4 C)     TempSrc: Oral     Resp: 9 9 9 14   Height:      Weight:      SpO2: 100% 100% 100% 100%    General: Alert, oriented x3 at time of my exam Lungs: Clear Heart: Regular; S1-S2, no S3, no S4, no murmurs Abdomen: Bowel sounds present, soft, nontender; no guarding or rebound Extremities: No edema  Lab results:   Basic Metabolic Panel:  Recent Labs  40/98/11 0700 01/31/13 0335  NA 143 138  K 4.5 4.3  CL 110 108  CO2 22 18*  GLUCOSE 105* 87  BUN 15 10  CREATININE 0.59 0.51  CALCIUM 9.0 8.9    Liver Function Tests:  Recent Labs  01/30/13 0700 01/31/13 0335  AST 45* 49*  ALT 20 20  ALKPHOS 253* 195*  BILITOT 2.6* 3.0*  PROT 6.7 6.1  ALBUMIN 2.3* 2.1*     Recent Labs  01/30/13 0737  AMMONIA 124*    CBC:  Recent Labs  01/30/13 0700 01/31/13 0335  WBC 7.2 5.1  HGB 9.0* 8.6*  HCT 27.7* 26.6*  MCV 94.2 95.3  PLT 145* 141*    Recent Labs  01/30/13 0700  NEUTROABS 4.8  LYMPHSABS 1.2   MONOABS 0.9  EOSABS 0.2  BASOSABS 0.1    Coagulation:  Recent Labs  01/30/13 0700 01/31/13 0335  INR 1.72* 1.63*    Urine Drug Screen: Drugs of Abuse     Component Value Date/Time   LABOPIA NONE DETECTED 01/31/2013 1030   COCAINSCRNUR NONE DETECTED 01/31/2013 1030   LABBENZ NONE DETECTED 01/31/2013 1030   AMPHETMU NONE DETECTED 01/31/2013 1030   THCU NONE DETECTED 01/31/2013 1030   LABBARB NONE DETECTED 01/31/2013 1030     Alcohol Level:  Recent Labs  01/30/13 0700  ETH <11    Urinalysis    Component Value Date/Time   COLORURINE AMBER* 01/31/2013 1030   APPEARANCEUR CLEAR 01/31/2013 1030   LABSPEC 1.020 01/31/2013 1030   PHURINE 5.5 01/31/2013 1030   GLUCOSEU NEGATIVE 01/31/2013 1030   HGBUR NEGATIVE 01/31/2013 1030   BILIRUBINUR NEGATIVE 01/31/2013 1030   KETONESUR NEGATIVE 01/31/2013 1030   PROTEINUR NEGATIVE 01/31/2013 1030   UROBILINOGEN 0.2 01/31/2013 1030   NITRITE NEGATIVE 01/31/2013 1030   LEUKOCYTESUR TRACE* 01/31/2013 1030    Urine microscopic:  Recent Labs  01/31/13 1030  EPIU RARE  WBCU 0-2  BACTERIA RARE     Imaging results:  US Abdomen Limited  01/30/2013   CLINICAL DATA:  43 year old female with altered mental status. History of cirrhosis and recurrent ascites. Initial  encounter.  EXAM: US ABDOMEN LIMITED - ascites  COMPARISON:  09/14/2012 and earlier.  FINDINGS: Small volume of free fluid in the left upper and lower quadrants, quantity insufficient for paracentesis. No definite additional free fluid.  IMPRESSION: Trace ascites, quantity insufficient for paracentesis.   Electronically Signed   By: Augusto GambleLee  Hall M.D.   On: 01/30/2013 15:51    Other results: EKG: Normal sinus rhythm; prolonged QT  Assessment & Plan by Problem:  1.  Altered mental status due to hepatic encephalopathy.  Patient's mental status cleared following administration of lactulose overnight, and she is currently alert, oriented, and without complaint.  Plan is continue lactulose  and rifaximin; mobilize out of bed with assistance; supportive care.  2.  Reported abdominal pain.  Patient today denies any abdominal pain, dysuria, or other complaints.  Her exam shows a soft nontender abdomen.  She was started on empiric antibiotic treatment to cover possible SBP and UTI.  With negative urinalysis and no ascites fluid by ultrasound, can discontinue antibiotics.  3.  Other problems and plans as per the resident physician's note.

## 2013-01-31 NOTE — Discharge Summary (Signed)
Name: [name] MRN: [id number] DOB: November 19, 1970 43 y.o. PCP: No primary provider on file.  Date of Admission: 01/30/2013  6:51 AM Date of Discharge: 02/01/2013 Attending Physician: Dr. 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     folic acid 800 MCG tablet  Commonly known as:  FOLVITE  Take 800 mcg by mouth daily.     furosemide 20 MG tablet  Commonly known as:  LASIX  Take 1 tablet (20 mg total) by mouth 2 (two) times daily.     lactulose 10 GM/15ML solution  Commonly known as:  CHRONULAC  Take 20 g by mouth 2 (two) times daily.     pantoprazole 40 MG tablet  Commonly known as:  PROTONIX  Take 40 mg by mouth 2 (two) times daily.     potassium chloride 20 MEQ/15ML (10%) solution  Take 20 mEq by mouth 2 (two) times daily.     propranolol 20 MG tablet  Commonly known as:  INDERAL  Take 20 mg by mouth daily.     rifaximin 550 MG Tabs tablet  Commonly known as:  XIFAXAN  Take 550 mg by mouth 2 (two) times daily.     spironolactone 100 MG tablet  Commonly known as:  ALDACTONE  Take 100 mg by mouth daily.     thiamine 100 MG tablet  Commonly known as:  VITAMIN B-1  Take 100 mg by mouth daily.     traMADol 50 MG tablet  Commonly known as:  ULTRAM  Take 1 tablet (50 mg total) by mouth every 6 (six) hours as needed for moderate pain.     ursodiol 300 MG capsule  Commonly known as:  ACTIGALL  Take 300 mg by mouth 2 (two) times daily.     WOMENS ONE DAILY Tabs  Take 1 tablet by mouth daily.        Disposition and follow-up:   [name] [facility]  Stable condition.  At the hospital follow up visit please address:  1.  Lactulose compliance- patient noncompliant with lactulose and presented w/ hepatic encephalopathy; I emphasized to the patient the importance of taking this medication  2.  Labs / imaging needed at time of follow-up: none  3.  Pending labs/ test needing follow-up: BCx x 2  Follow-up Appointments: Follow-up Information   Follow up with Shirley FriarSCHOOLER,VINCENT C., MD On 02/12/2013. (8:45am)    Specialty:  Gastroenterology   Contact information:   [address] [place] [address] [phone]       Follow up with Tanna FurryHOUT, BRITTANY, PA-C On 02/12/2013. (10:00 AM)    Specialty:  Physician Assistant   Contact information:   [facility] [address] [place] [address] [phone]       Discharge Instructions: Discharge Orders   Future Appointments Provider Department Dept Phone   02/19/2013 1:00 PM [facility] [facility] [phone]   Future Orders Complete By Expires   Call MD for:  extreme fatigue  As directed    Call MD for:  persistant dizziness or light-headedness  As directed    Call MD for:  severe uncontrolled pain  As directed    Call MD for:  temperature >100.4  As directed    Diet - low sodium heart healthy  As directed    Increase activity slowly  As directed       Consultations:  none  Procedures Performed:  US Abdomen Limited  01/30/2013   CLINICAL DATA:  43 year old female with altered mental status. History of cirrhosis and recurrent ascites. Initial encounter.  EXAM: US ABDOMEN LIMITED - ascites  COMPARISON:  09/14/2012 and earlier.  FINDINGS: Small volume of free fluid in the left upper and lower quadrants, quantity insufficient for paracentesis. No definite additional free fluid.  IMPRESSION: Trace ascites, quantity insufficient for paracentesis.   Electronically Signed   By: Augusto Gamble M.D.   On: 01/30/2013 15:51   Admission  HPI (original Thereasa Parkin Lorretta Harp, MD): Ms. 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  Patient is accompanied by her mother. History was obtained from both patient and patient's mother. Patient's mother states she has been in outpatient rehabilitation program since 11/03/12. She did not drink alcohol since then. She was supposed to take Lasix, spironolactone, rifaximin and lactulose for her cirrhosis, but she did not take lactulose for long time due to side effects of nausea. She was feeling her normal self until 3 days ago when she began having abdominal pain, dysuria and burning sensation on urination. Because patient is taking Lasix, it is not sure whether she has increased urinary frequency. Patient's abdominal pain is mainly located at the lower abdomen. No nausea, vomiting or diarrhea. No gross hematuria.  Per patient's mother, She seems to have been mildly confused in the past 7-10 days, but she could still take her medications. Her mental status has been progressively worsening. This morning she was found to be very confused and lethargic. She could not answer questions appropriately. Patient does not have fever or chills. Patient is generally weak, but does not have unilateral weakness in extremities. Patient was found to have ammonia level of 124, and was suspected to have hepatic encephalopathy by ED.   Hospital Course by problem list:   #: Hepatic encephalopathy: Patient's altered mental status was likely caused by hepatic encephalopathy 2/2 lactulose noncompliance. Ammonia level elevated at 124 on admission. Blood pressure soft (104/37) on admission. Patient was admitted to SDU. Initially patient was too confused to provide history, so mother stated that patient may have had symptoms of UTI. Pt also initially exhibited some abd pain, though she  was altered at this time. Therefore, cefotaxime 2g q8h was started for treatment of possible UTI and SBP. Diagnostic paracentesis was not performed since abd U/S showed there was not enough ascitic fluid for paracentesis. Patient did very well overnight after receiving lactulose 30g rectally x 2 doses, 1L NS bolus and NS at 125cc/hr overnight. Her home rifaximin 550mg  BID, ursodiol, protonix, propranolol were continued (once able to tolerate PO). Overnight her mental status returned to baseline. UA was clean and UCx showed no growth. BCx x 2 NGTD. Additionally, once patient's AMS had resolved, patient denied symptoms of dysuria and reported no abd pain. Therefore, on HD 1, cefotaxime was discontinued. Patient was transferred to med-surg floor and observed overnight  one more day on her home lactulose regimen (lactulose 20g BID). Blood pressure remained stable, though low-normal (113/51). She did well overnight and was ready for discharge. On morning of discharge she had no complaints and was ambulating without assistance. I scheduled follow up appointments with both her PCP and her GI physician.   # Alcochol abuse and cirrhosis w/ hx of upper GI bleed: Patient has history of alcoholic cirrhosis with ascites and esophageal varices s/p banding. Hemoglobin remained stable. No concern for active bleeding and FOBT negative. Patient has been in outpatient alcohol rehabilitation program since 11/03/12. She has not drank alcohol since then. LFTs stable.   # Depression: Stable. Continued home Wellbutrin.   #Tobacco abuse- 1PPD x many years, currently half pack a day. Smoking cessation counseling given, pt in contemplative stage.  Discharge Vitals:   BP 113/51  Pulse 87  Temp(Src) 98.6 F (37 C) (Oral)  Resp 16  Ht 5\' 5"  (1.651 m)  Wt 156 lb 15.5 oz (71.2 kg)  BMI 26.12 kg/m2  SpO2 100%  Discharge Labs:  No results found for this or any previous visit (from the past 24 hour(s)).  Signed: Windell Hummingbird,  MD 02/01/2013, 3:06 PM   Time Spent on Discharge: 35 minutes Services Ordered on Discharge: none Equipment Ordered on Discharge: none

## 2013-01-31 NOTE — Progress Notes (Addendum)
Pt requested "something stronger" for pain, that the tramadol isn't working.  Paged MD on call, per Dr., he is not going to change her p.o. Pain medication at this time.  Will continue to monitor.

## 2013-02-01 LAB — URINE CULTURE
Colony Count: NO GROWTH
Culture: NO GROWTH

## 2013-02-01 NOTE — Progress Notes (Signed)
Subjective: Patient remains at baseline mental status today. UDS negative. UA negative, so antibiotics were discontinued yesterday. Patient remains asymptomatic, specifically no abdominal pain or dysuria. She is ready to go home.   Objective: Vital signs in last 24 hours: Filed Vitals:   01/31/13 1400 01/31/13 1644 01/31/13 2045 02/01/13 0505  BP: 90/68 108/50 104/52 113/51  Pulse: 83 65 68 87  Temp:  98.3 F (36.8 C) 98.4 F (36.9 C) 98.6 F (37 C)  TempSrc:  Oral Oral Oral  Resp: 14 18 16 16   Height:      Weight:    156 lb 15.5 oz (71.2 kg)  SpO2: 100% 100% 100% 100%   Weight change: 5 lb 15.2 oz (2.7 kg)  Intake/Output Summary (Last 24 hours) at 02/01/13 1250 Last data filed at 01/31/13 2025  Gross per 24 hour  Intake    530 ml  Output      0 ml  Net    530 ml   Physical Exam General: alert, cooperative, and in no apparent distress HEENT: NCAT, vision grossly intact, oropharynx clear and non-erythematous  Neck: supple Lungs: clear to ascultation bilaterally, normal work of respiration, no wheezes, rales, ronchi Heart: regular rate and rhythm, no murmurs, gallops, or rubs Abdomen: soft, protuberant, non-tender, non-distended, normal bowel sounds Extremities: warm extremities, no pedal edema Neurologic: alert & oriented X3, appropriate speech, cranial nerves II-XII intact, strength 5/5 throughout, sensation intact to light touch  Lab Results: Basic Metabolic Panel:  Recent Labs Lab 01/30/13 0700 01/31/13 0335  NA 143 138  K 4.5 4.3  CL 110 108  CO2 22 18*  GLUCOSE 105* 87  BUN 15 10  CREATININE 0.59 0.51  CALCIUM 9.0 8.9   Liver Function Tests:  Recent Labs Lab 01/30/13 0700 01/31/13 0335  AST 45* 49*  ALT 20 20  ALKPHOS 253* 195*  BILITOT 2.6* 3.0*  PROT 6.7 6.1  ALBUMIN 2.3* 2.1*    Recent Labs Lab 01/30/13 0737  AMMONIA 124*   CBC:  Recent Labs Lab 01/30/13 0700 01/31/13 0335  WBC 7.2 5.1  NEUTROABS 4.8  --   HGB 9.0* 8.6*  HCT  27.7* 26.6*  MCV 94.2 95.3  PLT 145* 141*   Coagulation:  Recent Labs Lab 01/30/13 0700 01/31/13 0335  LABPROT 19.7* 18.9*  INR 1.72* 1.63*   Urine Drug Screen: Drugs of Abuse     Component Value Date/Time   LABOPIA NONE DETECTED 01/31/2013 1030   COCAINSCRNUR NONE DETECTED 01/31/2013 1030   LABBENZ NONE DETECTED 01/31/2013 1030   AMPHETMU NONE DETECTED 01/31/2013 1030   THCU NONE DETECTED 01/31/2013 1030   LABBARB NONE DETECTED 01/31/2013 1030    Alcohol Level:  Recent Labs Lab 01/30/13 0700  ETH <11   Urinalysis:  Recent Labs Lab 01/31/13 1030  COLORURINE AMBER*  LABSPEC 1.020  PHURINE 5.5  GLUCOSEU NEGATIVE  HGBUR NEGATIVE  BILIRUBINUR NEGATIVE  KETONESUR NEGATIVE  PROTEINUR NEGATIVE  UROBILINOGEN 0.2  NITRITE NEGATIVE  LEUKOCYTESUR TRACE*    Micro Results: Recent Results (from the past 240 hour(s))  MRSA PCR SCREENING     Status: None   Collection Time    01/30/13  1:47 PM      Result Value Range Status   MRSA by PCR NEGATIVE  NEGATIVE Final   Comment:            The GeneXpert MRSA Assay (FDA     approved for NASAL specimens     only), is one component of  a     comprehensive MRSA colonization     surveillance program. It is not     intended to diagnose MRSA     infection nor to guide or     monitor treatment for     MRSA infections.  CULTURE, BLOOD (ROUTINE X 2)     Status: None   Collection Time    01/30/13  2:48 PM      Result Value Range Status   Specimen Description BLOOD LEFT ARM   Final   Special Requests BOTTLES DRAWN AEROBIC ONLY 10CC   Final   Culture  Setup Time     Final   Value: 01/30/2013 20:25     Performed at [facility]   Culture     Final   Value:        BLOOD CULTURE RECEIVED NO GROWTH TO DATE CULTURE WILL BE HELD FOR 5 DAYS BEFORE ISSUING A FINAL NEGATIVE REPORT     Performed at [facility]   Report Status PENDING   Incomplete  CULTURE, BLOOD (ROUTINE X 2)     Status: None   Collection Time    01/30/13   3:00 PM      Result Value Range Status   Specimen Description BLOOD RIGHT HAND   Final   Special Requests BOTTLES DRAWN AEROBIC ONLY 10CC   Final   Culture  Setup Time     Final   Value: 01/30/2013 20:25     Performed at [facility]   Culture     Final   Value:        BLOOD CULTURE RECEIVED NO GROWTH TO DATE CULTURE WILL BE HELD FOR 5 DAYS BEFORE ISSUING A FINAL NEGATIVE REPORT     Performed at [facility]   Report Status PENDING   Incomplete  URINE CULTURE     Status: None   Collection Time    01/31/13 10:30 AM      Result Value Range Status   Specimen Description URINE, CLEAN CATCH   Final   Special Requests NONE   Final   Culture  Setup Time     Final   Value: 01/31/2013 16:38     Performed at [facility]   Colony Count     Final   Value: NO GROWTH     Performed at [facility]   Culture     Final   Value: NO GROWTH     Performed at [facility]   Report Status 02/01/2013 FINAL   Final   Studies/Results: [place] Abdomen Limited  01/30/2013   CLINICAL DATA:  43 year old female with altered mental status. History of cirrhosis and recurrent ascites. Initial encounter.  EXAM: US ABDOMEN LIMITED - ascites  COMPARISON:  09/14/2012 and earlier.  FINDINGS: Small volume of free fluid in the left upper and lower quadrants, quantity insufficient for paracentesis. No definite additional free fluid.  IMPRESSION: Trace ascites, quantity insufficient for paracentesis.   Electronically Signed   By: Augusto GambleLee  Hall M.D.   On: 01/30/2013 15:51   Medications: I have reviewed the patient's current medications. Scheduled Meds: . buPROPion  150 mg Oral Daily  . folic acid  1 mg Oral Daily  . heparin  5,000 Units Subcutaneous Q8H  . lactulose  20 g Oral BID  . multivitamin with minerals  1 tablet Oral Daily  . nicotine  21 mg Transdermal Daily  . pantoprazole  40 mg Oral BID  .  propranolol  20 mg Oral Daily  . rifaximin  550 mg Oral BID  . sodium chloride  3 mL  Intravenous Q12H  . thiamine  100 mg Oral Daily  . ursodiol  300 mg Oral BID   Continuous Infusions:  PRN Meds:.sodium chloride, sodium chloride, traMADol  Assessment/Plan: # Hepatic encephalopathy: Patient's mental status is back at baseline. She is up and ambulating without problem. Currently being managed on her home lactulose and rifaximin regimen. Antibiotics discontinued yesterday given UA was negative and patient does not exhibit signs/symptoms of infection. No concern for SBP at this time. -Lactulose to 20g BID orally -continue rifaximin 550mg  BID, ursodiol, protonix, propranolol -BCx x 2- NGTD - UCx-no growth  # Alcochol abuse and cirrhosis w/ hx of upper GI bleed: Stable. No concern for bleeding. I set up a GI follow up appointment for patient. -continue protonix, Inderal 20mg  daily -continue home folate and Vb1   # Depression: Mood appears stable. Continue home Wellbutrin.   # DVT px: Heparin sq  Dispo: Discharge today.   The patient does not have a current PCP (No primary provider on file.) and does need an [facility] hospital follow-up appointment after discharge.  The patient does not have transportation limitations that hinder transportation to clinic appointments.  .Services Needed at time of discharge: Y = Yes, Blank = No PT:   OT:   RN:   Equipment:   Other:     LOS: 2 days   Windell Hummingbird, MD 02/01/2013, 12:50 PM

## 2013-02-01 NOTE — Discharge Instructions (Signed)
It is VERY important that you take your lactulose. It will prevent the type of confusion that you were admitted with.  Hepatic Encephalopathy Hepatic encephalopathy is a syndrome. This is a set of symptoms that occur together. It is seen mostly in patients with damage to the liver known as cirrhosis. This is where normal liver tissue has been replaced by scar tissue.  Symptoms of the syndrome include:  Changes in personality.  Mental impairment.  A depressed level of consciousness. These changes occur because toxins build up in the bloodstream. The build up occurs because the scarred liver cannot rid toxins from the body. The most important of these toxins is ammonia. Toxins can cause abnormal behavior and confusion. Toxins in the blood stream can impair your ability to take care of yourself or others. Some people become very sleepy and cannot be woken easily. In severe cases, the patient lapses into a coma.  CAUSES  There are many things that can cause liver damage that can lead to buildup of toxins. These include:  Diseases that cause cirrhosis of the liver.  Long-term alcohol use with progressive liver damage.  Hepatitis B or C with ongoing infection and liver damage.  Patients without cirrhosis who have undergone shunt surgery.  Kidney failure.  Bleeding in the stomach or intestines.  Infection.  Constipation.  Medications that act upon the central nervous system.  Diuretic therapy.  Excessive dietary protein. SYMPTOMS  Symptoms of this syndrome are categorized or "staged" based on severity.   Stage 0. Minimal hepatic encephalopathy. No detectable changes in personality or behavior. Minimal changes in memory, concentration, mental function, and physical ability.  Stage 1. Some lack of awareness. Shortened attention span. Problems with addition or subtraction. Possible problems with sleeping or a reversal of the normal sleep pattern. Euphoria, depression, or irritability  may be present. Mild confusion. Slowing of mental ability. Tremors may be detected.  Stage 2. Lethargy or apathy. Disoriented. Strange behavior. Slurred speech. Obvious tremors. Drowsiness, unable to perform mental tasks. Personality changes, and confusion about time.  Stage 3. Very sleepy but can be aroused. Unable to perform mental tasks, cannot keep track of time and place, marked confusion, amnesia, occasional fits of rage, speech cannot be understood.  Stage 4. Coma with or without response to painful stimuli. DIAGNOSIS  In mild cases, a careful history and physical exam may lead your caregiver to consider possible mild hepatic encephalopathy as the cause of symptoms. The diagnosis is clearer in more severe cases. An elevated blood ammonia level is the classic blood test abnormality in patients with this syndrome. Other tests can be helpful to rule out other diseases.  TREATMENT   Medications are often used to lower the ammonia level in the blood. This usually leads to improvement.  Diets containing vegetable proteins are better than diets rich in animal protein, especially proteins derived from red meats. Eating well-cooked chicken and fish in addition to vegetable protein should be discussed with your caregiver. Malnourished patients are encouraged to add liquid nutritional supplements to their diet.  Antibiotics are sometimes used to try to lessen the volume of bacteria in the intestines that produce ammonia.  Moderate to severe cases of this syndrome usually require a hospital stay and medicine that is given directly into a vein (intravenously). HOME CARE INSTRUCTIONS  The goal at home is to avoid things that can make the condition worse and lead to a buildup of ammonia in the blood.  Eat a well balanced diet. Your caregiver  can help you with suggestions on this.  Talk to your caregiver before taking vitamin supplements. Large doses of vitamins and minerals, especially vitamin A,  iron, or copper, can worsen liver damage.  A low salt diet, water restriction, or diuretic medicine may be needed to reduce fluid retention.  Avoid alcohol and acetaminophen as well as any over-the-counter medications that contain acetaminophen (check labels). Only take over-the-counter or prescription medicines for pain, discomfort, or fever as directed by your caregiver.  Avoid drugs that are toxic to the liver. Review your medications (both prescription and non-prescription) with your caregiver to make sure those you are taking will not be harmful.  Blood tests may be needed. Follow your caregiver's advice regarding the timing of these.  With this condition you play a critical role in maintaining your own good health. The failure to follow your caregiver's advice and these instructions may result in permanent disability or death. SEEK MEDICAL CARE IF:   You have increasing fatigue or weakness.  You develop increasing swelling of the abdomen, hands, feet, legs or face.  You develop loss of appetite.  You are feeling sick to your stomach (nausea) and vomiting.  You develop jaundice. This is a yellow discoloration of the skin.  You develop worsening problems with concentration, confusion, and/or problems with sleep. SEEK IMMEDIATE MEDICAL CARE IF:   You vomit bright red blood or a coffee ground-looking material.  You have blood in your stools. Or the stools turn black and tarry.  You have a fever.  You develop easy bruising or bleeding.  You have a return of slurred speech, change in behavior, or confusion. MAKE SURE YOU:   Understand these instructions.  Will watch your condition.  Will get help right away if you are not doing well or get worse. Document Released: 03/01/2006 Document Revised: 03/14/2011 Document Reviewed: 12/06/2006 [facility] Patient Information 2014 [place], [place].

## 2013-02-01 NOTE — ED Provider Notes (Signed)
CSN: [id number]     Arrival date & time 01/30/13  4098 History   First MD Initiated Contact with Patient 01/30/13 [phone]     Chief Complaint  Patient presents with  . Altered Mental Status    HPI  Patient presents with her boyfriend. She was on the floor and speaking to person wasn't there this morning. Pallor is trying to argue with her mother and her mother was not present. States she's had trouble like this in the past. In review of her chart and within it is been with episodes of elevated ammonia and hepatic encephalopathy. She continues to drink intermittently. He does not know she was drinking last night or this morning. History of esophageal varices and bleeding. No reported emesis at home. She states her stools have been dark. No falls injuries or trauma to the knowledge of the family.  Past Medical History  Diagnosis Date  . Sclerosing cholangitis   . Panic attacks   . Anxiety   . Alcohol abuse   . Ascites   . Cirrhosis of liver   . EtOH dependence 08/31/2012  . Family history of anesthesia complication     " my son had problems "   Past Surgical History  Procedure Laterality Date  . None    . Ercp    . Esophagogastroduodenoscopy N/A 09/10/2012    Procedure: ESOPHAGOGASTRODUODENOSCOPY (EGD);  Surgeon: Florencia Reasons, MD;  Location: [facility] ENDOSCOPY;  Service: Endoscopy;  Laterality: N/A;  . Esophagogastroduodenoscopy N/A 11/04/2012    Procedure: ESOPHAGOGASTRODUODENOSCOPY (EGD);  Surgeon: Barrie Folk, MD;  Location: [facility] ENDOSCOPY;  Service: Endoscopy;  Laterality: N/A;   Family History  Problem Relation Age of Onset  . CAD Father    History  Substance Use Topics  . Smoking status: Current Every Day Smoker -- 1.00 packs/day for 20 years    Types: Cigarettes  . Smokeless tobacco: Never Used  . Alcohol Use: Yes     Comment: Pt drinks vodka/liquor every weekend, denies daily drinking...states she quit a month ago....   OB History   Grav Para Term Preterm Abortions TAB SAB  Ect Mult Living                 Review of Systems  Constitutional: Positive for fatigue. Negative for fever, chills, diaphoresis and appetite change.  HENT: Negative for mouth sores, sore throat and trouble swallowing.   Eyes: Negative for visual disturbance.  Respiratory: Negative for cough, chest tightness, shortness of breath and wheezing.   Cardiovascular: Negative for chest pain.  Gastrointestinal: Positive for abdominal pain and abdominal distention. Negative for nausea, vomiting, diarrhea, blood in stool and anal bleeding.       Dark stools per the patient's report. No known emesis of blood or bright red blood per rectum at home  Endocrine: Negative for polydipsia, polyphagia and polyuria.  Genitourinary: Negative for dysuria, frequency and hematuria.  Musculoskeletal: Negative for gait problem.  Skin: Negative for color change, pallor and rash.  Neurological: Negative for dizziness, syncope, light-headedness and headaches.  Hematological: Does not bruise/bleed easily.  Psychiatric/Behavioral: Positive for confusion. Negative for behavioral problems.    Allergies  Review of patient's allergies indicates no known allergies.  Home Medications  No current outpatient prescriptions on file. BP 113/51  Pulse 87  Temp(Src) 98.6 F (37 C) (Oral)  Resp 16  Ht 5\' 5"  (1.651 m)  Wt 156 lb 15.5 oz (71.2 kg)  BMI 26.12 kg/m2  SpO2 100% Physical Exam  Constitutional:  Awake. Confusion. Not somnolent.  HENT:  Conjunctiva are pale. Subtle scleral icterus.  Eyes:  Slight icterus  Genitourinary:  Rectal is guaiac negative brown stool  Neurological:  Fused. Moving all extremities. No signs of trauma the head.  Psychiatric:  Calm. Disoriented. Confused.    ED Course  Procedures (including critical care time) Labs Review Labs Reviewed  CBC WITH DIFFERENTIAL - Abnormal; Notable for the following:    RBC 2.94 (*)    Hemoglobin 9.0 (*)    HCT 27.7 (*)    RDW 21.9 (*)     Platelets 145 (*)    Monocytes Relative 13 (*)    All other components within normal limits  COMPREHENSIVE METABOLIC PANEL - Abnormal; Notable for the following:    Glucose, Bld 105 (*)    Albumin 2.3 (*)    AST 45 (*)    Alkaline Phosphatase 253 (*)    Total Bilirubin 2.6 (*)    All other components within normal limits  PROTIME-INR - Abnormal; Notable for the following:    Prothrombin Time 19.7 (*)    INR 1.72 (*)    All other components within normal limits  AMMONIA - Abnormal; Notable for the following:    Ammonia 124 (*)    All other components within normal limits  URINALYSIS, ROUTINE W REFLEX MICROSCOPIC - Abnormal; Notable for the following:    Color, Urine AMBER (*)    Leukocytes, UA TRACE (*)    All other components within normal limits  COMPREHENSIVE METABOLIC PANEL - Abnormal; Notable for the following:    CO2 18 (*)    Albumin 2.1 (*)    AST 49 (*)    Alkaline Phosphatase 195 (*)    Total Bilirubin 3.0 (*)    All other components within normal limits  PROTIME-INR - Abnormal; Notable for the following:    Prothrombin Time 18.9 (*)    INR 1.63 (*)    All other components within normal limits  CBC - Abnormal; Notable for the following:    RBC 2.79 (*)    Hemoglobin 8.6 (*)    HCT 26.6 (*)    RDW 21.8 (*)    Platelets 141 (*)    All other components within normal limits  URINE CULTURE  CULTURE, BLOOD (ROUTINE X 2)  CULTURE, BLOOD (ROUTINE X 2)  MRSA PCR SCREENING  BODY FLUID CULTURE  GRAM STAIN  ETHANOL  URINE RAPID DRUG SCREEN (HOSP PERFORMED)  URINE MICROSCOPIC-ADD ON  BODY FLUID CELL COUNT WITH DIFFERENTIAL  PH, BODY FLUID  PROTEIN, BODY FLUID  CG4 I-STAT (LACTIC ACID)  OCCULT BLOOD, POC DEVICE  TYPE AND SCREEN   Imaging Review US Abdomen Limited  01/30/2013   CLINICAL DATA:  43 year old female with altered mental status. History of cirrhosis and recurrent ascites. Initial encounter.  EXAM: US ABDOMEN LIMITED - ascites  COMPARISON:  09/14/2012 and  earlier.  FINDINGS: Small volume of free fluid in the left upper and lower quadrants, quantity insufficient for paracentesis. No definite additional free fluid.  IMPRESSION: Trace ascites, quantity insufficient for paracentesis.   Electronically Signed   By: Augusto Gamble M.D.   On: 01/30/2013 15:51      MDM   1. Sclerosing cholangitis   2. Anxiety   3. Panic attacks   4. Alcohol abuse   5. Cirrhosis of liver   6. Abdominal pain   7. Ascites   8. Depression   9. 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 UTI (urinary tract infection)   12. Anemia   13. Esophageal varices in alcoholic cirrhosis    Ammonia is elevated. Still remains confused. She is at baseline with a hemoglobin. I discussed the case with internal medicine teaching service. They were in line for the next admission for this unassigned patient. She is being admitted under their care.    Rolland PorterMark Lacrecia Delval, MD 02/01/13 1121

## 2013-02-05 LAB — CULTURE, BLOOD (ROUTINE X 2)
Culture: NO GROWTH
Culture: NO GROWTH

## 2013-02-17 ENCOUNTER — Encounter (HOSPITAL_COMMUNITY): Payer: Self-pay | Admitting: Emergency Medicine

## 2013-02-17 ENCOUNTER — Inpatient Hospital Stay (HOSPITAL_COMMUNITY)
Admission: EM | Admit: 2013-02-17 | Discharge: 2013-02-19 | DRG: 432 | Disposition: A | Discharge status: Home / Self Care | Payer: Medicaid Other | Attending: Internal Medicine | Admitting: Internal Medicine

## 2013-02-17 DIAGNOSIS — I8511 Secondary esophageal varices with bleeding: Secondary | ICD-10-CM | POA: Diagnosis present

## 2013-02-17 DIAGNOSIS — F10229 Alcohol dependence with intoxication, unspecified: Secondary | ICD-10-CM | POA: Diagnosis present

## 2013-02-17 DIAGNOSIS — K7030 Alcoholic cirrhosis of liver without ascites: Principal | ICD-10-CM | POA: Diagnosis present

## 2013-02-17 DIAGNOSIS — F32A Depression, unspecified: Secondary | ICD-10-CM

## 2013-02-17 DIAGNOSIS — F411 Generalized anxiety disorder: Secondary | ICD-10-CM | POA: Diagnosis present

## 2013-02-17 DIAGNOSIS — F329 Major depressive disorder, single episode, unspecified: Secondary | ICD-10-CM

## 2013-02-17 DIAGNOSIS — K8309 Other cholangitis: Secondary | ICD-10-CM | POA: Diagnosis present

## 2013-02-17 DIAGNOSIS — K920 Hematemesis: Secondary | ICD-10-CM

## 2013-02-17 DIAGNOSIS — F1010 Alcohol abuse, uncomplicated: Secondary | ICD-10-CM

## 2013-02-17 DIAGNOSIS — F10929 Alcohol use, unspecified with intoxication, unspecified: Secondary | ICD-10-CM

## 2013-02-17 DIAGNOSIS — R609 Edema, unspecified: Secondary | ICD-10-CM

## 2013-02-17 DIAGNOSIS — R188 Other ascites: Secondary | ICD-10-CM

## 2013-02-17 DIAGNOSIS — F17200 Nicotine dependence, unspecified, uncomplicated: Secondary | ICD-10-CM | POA: Diagnosis present

## 2013-02-17 DIAGNOSIS — F1020 Alcohol dependence, uncomplicated: Secondary | ICD-10-CM | POA: Diagnosis present

## 2013-02-17 DIAGNOSIS — D649 Anemia, unspecified: Secondary | ICD-10-CM | POA: Diagnosis present

## 2013-02-17 DIAGNOSIS — Z7982 Long term (current) use of aspirin: Secondary | ICD-10-CM

## 2013-02-17 DIAGNOSIS — F419 Anxiety disorder, unspecified: Secondary | ICD-10-CM

## 2013-02-17 DIAGNOSIS — Z8249 Family history of ischemic heart disease and other diseases of the circulatory system: Secondary | ICD-10-CM

## 2013-02-17 DIAGNOSIS — K7460 Unspecified cirrhosis of liver: Secondary | ICD-10-CM | POA: Diagnosis present

## 2013-02-17 DIAGNOSIS — F410 Panic disorder [episodic paroxysmal anxiety] without agoraphobia: Secondary | ICD-10-CM | POA: Diagnosis present

## 2013-02-17 MED ORDER — ONDANSETRON HCL 4 MG/2ML IJ SOLN
4.0000 mg | Freq: Once | INTRAMUSCULAR | Status: AC
  Administered 2013-02-18: 4 mg via INTRAVENOUS
  Filled 2013-02-17: qty 2

## 2013-02-17 MED ORDER — PANTOPRAZOLE SODIUM 40 MG IV SOLR
40.0000 mg | Freq: Once | INTRAVENOUS | Status: AC
  Administered 2013-02-18: 40 mg via INTRAVENOUS
  Filled 2013-02-17: qty 40

## 2013-02-17 NOTE — ED Provider Notes (Signed)
CSN: [id number]     Arrival date & time 02/17/13  2322 History   First MD Initiated Contact with Patient 02/17/13 2336     Chief Complaint  Patient presents with  . Hematemesis     (Consider location/radiation/quality/duration/timing/severity/associated sxs/prior Treatment) The history is provided by the patient.   43 year old female with a history of cirrhosis a liver secondary to sclerosing cholangitis and also history of alcohol abuse as noted some increasing swelling in her abdomen and in her legs. Today, she had an episode of hematemesis. Of note, she has known esophageal varices. She vomited bright red blood without any clots. She denies any abdominal pain. She claims compliance with all of her medications she does admit to having had 3 drinks tonight.  Past Medical History  Diagnosis Date  . Sclerosing cholangitis   . Panic attacks   . Anxiety   . Alcohol abuse   . Ascites   . Cirrhosis of liver   . EtOH dependence 08/31/2012  . Family history of anesthesia complication     " my son had problems "   Past Surgical History  Procedure Laterality Date  . None    . Ercp    . Esophagogastroduodenoscopy N/A 09/10/2012    Procedure: ESOPHAGOGASTRODUODENOSCOPY (EGD);  Surgeon: Florencia Reasons, MD;  Location: [facility] ENDOSCOPY;  Service: Endoscopy;  Laterality: N/A;  . Esophagogastroduodenoscopy N/A 11/04/2012    Procedure: ESOPHAGOGASTRODUODENOSCOPY (EGD);  Surgeon: Barrie Folk, MD;  Location: [facility] ENDOSCOPY;  Service: Endoscopy;  Laterality: N/A;   Family History  Problem Relation Age of Onset  . CAD Father    History  Substance Use Topics  . Smoking status: Current Every Day Smoker -- 1.00 packs/day for 20 years    Types: Cigarettes  . Smokeless tobacco: Never Used  . Alcohol Use: Yes     Comment: Pt drinks vodka/liquor every weekend, denies daily drinking...states she quit a month ago....   OB History   Grav Para Term Preterm Abortions TAB SAB Ect Mult Living                  Review of Systems  All other systems reviewed and are negative.      Allergies  Review of patient's allergies indicates no known allergies.  Home Medications   Current Outpatient Rx  Name  Route  Sig  Dispense  Refill  . buPROPion (WELLBUTRIN XL) 150 MG 24 hr tablet   Oral   Take 150 mg by mouth daily.         . folic acid (FOLVITE) 800 MCG tablet   Oral   Take 800 mcg by mouth daily.         . furosemide (LASIX) 20 MG tablet   Oral   Take 1 tablet (20 mg total) by mouth 2 (two) times daily.   20 tablet   0   . lactulose (CHRONULAC) 10 GM/15ML solution   Oral   Take 20 g by mouth 2 (two) times daily.         . Multiple Vitamins-Minerals (WOMENS ONE DAILY) TABS   Oral   Take 1 tablet by mouth daily.         . pantoprazole (PROTONIX) 40 MG tablet   Oral   Take 40 mg by mouth 2 (two) times daily.         . potassium chloride 20 MEQ/15ML (10%) solution   Oral   Take 20 mEq by mouth 2 (two) times daily.         Marland Kitchen  propranolol (INDERAL) 20 MG tablet   Oral   Take 20 mg by mouth daily.         . rifaximin (XIFAXAN) 550 MG TABS tablet   Oral   Take 550 mg by mouth 2 (two) times daily.         Marland Kitchen spironolactone (ALDACTONE) 100 MG tablet   Oral   Take 100 mg by mouth daily.         Marland Kitchen thiamine (VITAMIN B-1) 100 MG tablet   Oral   Take 100 mg by mouth daily.         . traMADol (ULTRAM) 50 MG tablet   Oral   Take 1 tablet (50 mg total) by mouth every 6 (six) hours as needed for moderate pain.   28 tablet   0   . ursodiol (ACTIGALL) 300 MG capsule   Oral   Take 300 mg by mouth 2 (two) times daily.          BP 101/48  Pulse 97  Temp(Src) 98.3 F (36.8 C) (Oral)  Resp 18  SpO2 100% Physical Exam  Nursing note and vitals reviewed.  43 year old female, resting comfortably and in no acute distress. Vital signs are normal. Oxygen saturation is 100%, which is normal. Head is normocephalic and atraumatic. PERRLA, EOMI. Oropharynx is  clear. There is no scleral icterus. Neck is nontender and supple without adenopathy or JVD. Back is nontender and there is no CVA tenderness. Lungs are clear without rales, wheezes, or rhonchi. Chest is nontender. Heart has regular rate and rhythm with 2/6 systolic ejection murmur. Abdomen is distended with fluid wave present consistent with ascites. There is mild epigastric tenderness without rebound or guarding. There are no masses or hepatosplenomegaly and peristalsis is normoactive. Extremities have 2-3+ edema, full range of motion is present. Skin is warm and dry without rash. Scattered spider angiomata are present. Neurologic: Mental status is normal, cranial nerves are intact, there are no motor or sensory deficits.  ED Course  Procedures (including critical care time) Labs Review Results for orders placed during the hospital encounter of 02/17/13  CBC WITH DIFFERENTIAL      Result Value Ref Range   WBC 7.9  4.0 - 10.5 K/uL   RBC 2.68 (*) 3.87 - 5.11 MIL/uL   Hemoglobin 8.4 (*) 12.0 - 15.0 g/dL   HCT 16.1 (*) 09.6 - 04.5 %   MCV 93.7  78.0 - 100.0 fL   MCH 31.3  26.0 - 34.0 pg   MCHC 33.5  30.0 - 36.0 g/dL   RDW 40.9 (*) 81.1 - 91.4 %   Platelets 233  150 - 400 K/uL   Neutrophils Relative % PENDING  43 - 77 %   Neutro Abs PENDING  1.7 - 7.7 K/uL   Band Neutrophils PENDING  0 - 10 %   Lymphocytes Relative PENDING  12 - 46 %   Lymphs Abs PENDING  0.7 - 4.0 K/uL   Monocytes Relative PENDING  3 - 12 %   Monocytes Absolute PENDING  0.1 - 1.0 K/uL   Eosinophils Relative PENDING  0 - 5 %   Eosinophils Absolute PENDING  0.0 - 0.7 K/uL   Basophils Relative PENDING  0 - 1 %   Basophils Absolute PENDING  0.0 - 0.1 K/uL   WBC Morphology PENDING     RBC Morphology PENDING     Smear Review PENDING     nRBC PENDING  0 /100 WBC   Metamyelocytes  Relative PENDING     Myelocytes PENDING     Promyelocytes Absolute PENDING     Blasts PENDING    COMPREHENSIVE METABOLIC PANEL       Result Value Ref Range   Sodium 139  137 - 147 mEq/L   Potassium 4.1  3.7 - 5.3 mEq/L   Chloride 98  96 - 112 mEq/L   CO2 28  19 - 32 mEq/L   Glucose, Bld 143 (*) 70 - 99 mg/dL   BUN 20  6 - 23 mg/dL   Creatinine, Ser 1.610.94  0.50 - 1.10 mg/dL   Calcium 7.9 (*) 8.4 - 10.5 mg/dL   Total Protein 7.2  6.0 - 8.3 g/dL   Albumin 2.5 (*) 3.5 - 5.2 g/dL   AST 65 (*) 0 - 37 U/L   ALT 22  0 - 35 U/L   Alkaline Phosphatase 280 (*) 39 - 117 U/L   Total Bilirubin 2.0 (*) 0.3 - 1.2 mg/dL   GFR calc non Af Amer 73 (*) >90 mL/min   GFR calc Af Amer 84 (*) >90 mL/min  LIPASE, BLOOD      Result Value Ref Range   Lipase 22  11 - 59 U/L  ETHANOL      Result Value Ref Range   Alcohol, Ethyl (B) 209 (*) 0 - 11 mg/dL  AMMONIA      Result Value Ref Range   Ammonia 10 (*) 11 - 60 umol/L  PROTIME-INR      Result Value Ref Range   Prothrombin Time 16.4 (*) 11.6 - 15.2 seconds   INR 1.36  0.00 - 1.49  CG4 I-STAT (LACTIC ACID)      Result Value Ref Range   Lactic Acid, Venous 1.44  0.5 - 2.2 mmol/L    MDM   Final diagnoses:  Hematemesis  Cirrhosis of liver  Alcohol intoxication  Anemia    Hematemesis in the setting of cirrhosis with known esophageal varices. Increasing ascites and peripheral edema. Ongoing ethanol abuse. Old records are reviewed and she has a recent hospitalization for hepatic encephalopathy secondary to medication noncompliance. She states that she is currently taking all of her medications as prescribed. She is given a dose of pantoprazole and ondansetron and laboratory workup was initiated including CBC, complete metabolic panel, lipase, ethanol level, INR.  Workup is unremarkable. Hemoglobin is stable compared with the last result from about 2-1/2 weeks ago, and liver functions are not significantly different. Ammonia level is normal. Ethanol level was elevated indicating she is intoxicated. Lactic acid level is normal. She has not had any further vomiting in the ED. However, with  a known history of esophageal varices, I feel that she needs to be observed to make sure that hemoglobin is stable and that she does not have recurrent vomiting. Case is discussed with Dr. Manson PasseyBrown of internal medicine teaching service who agrees to come and admit the patient.  Dione Boozeavid Dania Marsan, MD 02/18/13 [phone]

## 2013-02-17 NOTE — ED Notes (Signed)
Per ems, pt called ems out for vomiting blood x1. States it was bright red. Hx of esophageal varices, was treated with a balloon. Pt has cirrhosis of the liver and ulcerative colitis and hepatic encephalopathy. Pt is on the transplant list, but drank three mikes hard lemonade. Pt has alcohol on board. C/o sob while walking, and complaining of leg swelling. States legs are the most swelling they've been so far. Pt also has large hiatal hernia. 99% RA, lung sounds clear. NSR, 97/54 BP. Pt ambulatory to the bathroom.

## 2013-02-17 NOTE — ED Notes (Signed)
Pt states she threw up once before she called ems. Pt speech is slurred, states she drank three mikes hard lemonade today. Pt is AAOx4, skin is warm and dry, pt has diffuse jaundice. Pt lower legs bilaerally have nonpitting edema. Pt abdomen is distended and firm.

## 2013-02-18 DIAGNOSIS — I8511 Secondary esophageal varices with bleeding: Secondary | ICD-10-CM | POA: Diagnosis present

## 2013-02-18 DIAGNOSIS — F1010 Alcohol abuse, uncomplicated: Secondary | ICD-10-CM

## 2013-02-18 DIAGNOSIS — I8501 Esophageal varices with bleeding: Secondary | ICD-10-CM

## 2013-02-18 DIAGNOSIS — K7030 Alcoholic cirrhosis of liver without ascites: Secondary | ICD-10-CM

## 2013-02-18 LAB — CBC
HCT: 22.8 % — ABNORMAL LOW (ref 36.0–46.0)
HCT: 24.6 % — ABNORMAL LOW (ref 36.0–46.0)
HEMATOCRIT: 23.3 % — AB (ref 36.0–46.0)
HEMOGLOBIN: 7.8 g/dL — AB (ref 12.0–15.0)
HEMOGLOBIN: 8 g/dL — AB (ref 12.0–15.0)
Hemoglobin: 7.5 g/dL — ABNORMAL LOW (ref 12.0–15.0)
MCH: 31 pg (ref 26.0–34.0)
MCH: 32 pg (ref 26.0–34.0)
MCH: 31.6 pg (ref 26.0–34.0)
MCHC: 32.9 g/dL (ref 30.0–36.0)
MCHC: 33.5 g/dL (ref 30.0–36.0)
MCHC: 32.5 g/dL (ref 30.0–36.0)
MCV: 94.2 fL (ref 78.0–100.0)
MCV: 95.5 fL (ref 78.0–100.0)
MCV: 97.2 fL (ref 78.0–100.0)
PLATELETS: 169 10*3/uL (ref 150–400)
Platelets: 173 10*3/uL (ref 150–400)
Platelets: 178 10*3/uL (ref 150–400)
RBC: 2.42 MIL/uL — AB (ref 3.87–5.11)
RBC: 2.44 MIL/uL — ABNORMAL LOW (ref 3.87–5.11)
RBC: 2.53 MIL/uL — ABNORMAL LOW (ref 3.87–5.11)
RDW: 20.2 % — AB (ref 11.5–15.5)
RDW: 20.5 % — ABNORMAL HIGH (ref 11.5–15.5)
RDW: 20.3 % — ABNORMAL HIGH (ref 11.5–15.5)
WBC: 5.2 10*3/uL (ref 4.0–10.5)
WBC: 5.2 10*3/uL (ref 4.0–10.5)
WBC: 4.6 10*3/uL (ref 4.0–10.5)

## 2013-02-18 LAB — CBC WITH DIFFERENTIAL/PLATELET
Basophils Absolute: 0.1 10*3/uL (ref 0.0–0.1)
Basophils Relative: 1 % (ref 0–1)
EOS PCT: 4 % (ref 0–5)
Eosinophils Absolute: 0.3 10*3/uL (ref 0.0–0.7)
HCT: 25.1 % — ABNORMAL LOW (ref 36.0–46.0)
Hemoglobin: 8.4 g/dL — ABNORMAL LOW (ref 12.0–15.0)
LYMPHS PCT: 22 % (ref 12–46)
Lymphs Abs: 1.7 10*3/uL (ref 0.7–4.0)
MCH: 31.3 pg (ref 26.0–34.0)
MCHC: 33.5 g/dL (ref 30.0–36.0)
MCV: 93.7 fL (ref 78.0–100.0)
Monocytes Absolute: 1.3 10*3/uL — ABNORMAL HIGH (ref 0.1–1.0)
Monocytes Relative: 17 % — ABNORMAL HIGH (ref 3–12)
NEUTROS PCT: 56 % (ref 43–77)
Neutro Abs: 4.5 10*3/uL (ref 1.7–7.7)
PLATELETS: 233 10*3/uL (ref 150–400)
RBC: 2.68 MIL/uL — ABNORMAL LOW (ref 3.87–5.11)
RDW: 20.2 % — ABNORMAL HIGH (ref 11.5–15.5)
WBC: 7.9 10*3/uL (ref 4.0–10.5)

## 2013-02-18 LAB — COMPREHENSIVE METABOLIC PANEL
ALK PHOS: 280 U/L — AB (ref 39–117)
ALT: 22 U/L (ref 0–35)
AST: 65 U/L — ABNORMAL HIGH (ref 0–37)
Albumin: 2.5 g/dL — ABNORMAL LOW (ref 3.5–5.2)
BILIRUBIN TOTAL: 2 mg/dL — AB (ref 0.3–1.2)
BUN: 20 mg/dL (ref 6–23)
CHLORIDE: 98 meq/L (ref 96–112)
CO2: 28 meq/L (ref 19–32)
Calcium: 7.9 mg/dL — ABNORMAL LOW (ref 8.4–10.5)
Creatinine, Ser: 0.94 mg/dL (ref 0.50–1.10)
GFR, EST AFRICAN AMERICAN: 84 mL/min — AB (ref >90)
GFR, EST NON AFRICAN AMERICAN: 73 mL/min — AB (ref >90)
GLUCOSE: 143 mg/dL — AB (ref 70–99)
POTASSIUM: 4.1 meq/L (ref 3.7–5.3)
SODIUM: 139 meq/L (ref 137–147)
Total Protein: 7.2 g/dL (ref 6.0–8.3)

## 2013-02-18 LAB — TYPE AND SCREEN
ABO/RH(D): O NEG
Antibody Screen: NEGATIVE

## 2013-02-18 LAB — CG4 I-STAT (LACTIC ACID): Lactic Acid, Venous: 1.44 mmol/L (ref 0.5–2.2)

## 2013-02-18 LAB — LIPASE, BLOOD: Lipase: 22 U/L (ref 11–59)

## 2013-02-18 LAB — PROTIME-INR
INR: 1.36 (ref 0.00–1.49)
Prothrombin Time: 16.4 seconds — ABNORMAL HIGH (ref 11.6–15.2)

## 2013-02-18 LAB — ETHANOL: Alcohol, Ethyl (B): 209 mg/dL — ABNORMAL HIGH (ref 0–11)

## 2013-02-18 LAB — AMMONIA: Ammonia: 10 umol/L — ABNORMAL LOW (ref 11–60)

## 2013-02-18 LAB — HIV ANTIBODY (ROUTINE TESTING W REFLEX): HIV: NONREACTIVE

## 2013-02-18 MED ORDER — BUPROPION HCL ER (XL) 150 MG PO TB24
150.0000 mg | ORAL_TABLET | Freq: Every day | ORAL | Status: DC
  Administered 2013-02-18 – 2013-02-19 (×2): 150 mg via ORAL
  Filled 2013-02-18 (×2): qty 1

## 2013-02-18 MED ORDER — RIFAXIMIN 550 MG PO TABS
550.0000 mg | ORAL_TABLET | Freq: Two times a day (BID) | ORAL | Status: DC
  Administered 2013-02-18 – 2013-02-19 (×3): 550 mg via ORAL
  Filled 2013-02-18 (×4): qty 1

## 2013-02-18 MED ORDER — VITAMIN B-1 100 MG PO TABS
100.0000 mg | ORAL_TABLET | Freq: Every day | ORAL | Status: DC
  Administered 2013-02-18 – 2013-02-19 (×2): 100 mg via ORAL
  Filled 2013-02-18 (×2): qty 1

## 2013-02-18 MED ORDER — TRAMADOL HCL 50 MG PO TABS
50.0000 mg | ORAL_TABLET | Freq: Four times a day (QID) | ORAL | Status: DC | PRN
  Administered 2013-02-18 (×3): 50 mg via ORAL
  Filled 2013-02-18 (×3): qty 1

## 2013-02-18 MED ORDER — URSODIOL 300 MG PO CAPS
300.0000 mg | ORAL_CAPSULE | Freq: Two times a day (BID) | ORAL | Status: DC
  Administered 2013-02-18 – 2013-02-19 (×3): 300 mg via ORAL
  Filled 2013-02-18 (×4): qty 1

## 2013-02-18 MED ORDER — FOLIC ACID 1 MG PO TABS
1.0000 mg | ORAL_TABLET | Freq: Every day | ORAL | Status: DC
  Administered 2013-02-18 – 2013-02-19 (×2): 1 mg via ORAL
  Filled 2013-02-18 (×2): qty 1

## 2013-02-18 MED ORDER — PROPRANOLOL HCL 20 MG PO TABS
20.0000 mg | ORAL_TABLET | Freq: Every day | ORAL | Status: DC
  Administered 2013-02-19: 20 mg via ORAL
  Filled 2013-02-18 (×2): qty 1

## 2013-02-18 MED ORDER — SPIRONOLACTONE 100 MG PO TABS
100.0000 mg | ORAL_TABLET | Freq: Every day | ORAL | Status: DC
  Administered 2013-02-18 – 2013-02-19 (×2): 100 mg via ORAL
  Filled 2013-02-18 (×2): qty 1

## 2013-02-18 MED ORDER — LORAZEPAM 1 MG PO TABS
1.0000 mg | ORAL_TABLET | Freq: Four times a day (QID) | ORAL | Status: DC | PRN
  Administered 2013-02-19: 1 mg via ORAL
  Filled 2013-02-18: qty 1

## 2013-02-18 MED ORDER — PANTOPRAZOLE SODIUM 40 MG PO TBEC
40.0000 mg | DELAYED_RELEASE_TABLET | Freq: Two times a day (BID) | ORAL | Status: DC
  Administered 2013-02-18 – 2013-02-19 (×3): 40 mg via ORAL
  Filled 2013-02-18 (×3): qty 1

## 2013-02-18 MED ORDER — ADULT MULTIVITAMIN W/MINERALS CH
1.0000 | ORAL_TABLET | Freq: Every day | ORAL | Status: DC
  Administered 2013-02-18 – 2013-02-19 (×2): 1 via ORAL
  Filled 2013-02-18 (×2): qty 1

## 2013-02-18 MED ORDER — POTASSIUM CHLORIDE 20 MEQ/15ML (10%) PO LIQD
20.0000 meq | Freq: Two times a day (BID) | ORAL | Status: DC
  Administered 2013-02-18 – 2013-02-19 (×3): 20 meq via ORAL
  Filled 2013-02-18 (×5): qty 15

## 2013-02-18 MED ORDER — SODIUM CHLORIDE 0.9 % IV SOLN
INTRAVENOUS | Status: DC
  Administered 2013-02-18 (×2): via INTRAVENOUS

## 2013-02-18 MED ORDER — LACTULOSE 10 GM/15ML PO SOLN
20.0000 g | Freq: Two times a day (BID) | ORAL | Status: DC
  Administered 2013-02-18 – 2013-02-19 (×3): 20 g via ORAL
  Filled 2013-02-18 (×4): qty 30

## 2013-02-18 MED ORDER — LORAZEPAM 2 MG/ML IJ SOLN: 1.0000 mg | Freq: Four times a day (QID) | INTRAMUSCULAR | Status: DC | PRN

## 2013-02-18 MED ORDER — FUROSEMIDE 20 MG PO TABS
20.0000 mg | ORAL_TABLET | Freq: Two times a day (BID) | ORAL | Status: DC
  Administered 2013-02-18 – 2013-02-19 (×3): 20 mg via ORAL
  Filled 2013-02-18 (×5): qty 1

## 2013-02-18 MED ORDER — ONDANSETRON HCL 4 MG/2ML IJ SOLN: 4.0000 mg | Freq: Four times a day (QID) | INTRAMUSCULAR | Status: DC | PRN

## 2013-02-18 MED ORDER — ONDANSETRON HCL 4 MG PO TABS: 4.0000 mg | ORAL_TABLET | Freq: Three times a day (TID) | ORAL | Status: DC | PRN

## 2013-02-18 NOTE — ED Notes (Addendum)
Pt states she slid yesterday while wearing knit socks, her left hand when through the wall and banged her right shoulder blade.  Right shoulder has mild bruising.  Pt left hand has bandaids applied from home.  Left hand has no deformities, able to move, and no swelling.

## 2013-02-18 NOTE — H&P (Signed)
Date: 02/18/2013               Patient Name:  [name] MRN: [id number]  DOB: [birth year] Age / Sex: 43 y.o., female   PCP: Tanna Furry, PA-C         Medical Service: Internal Medicine Teaching Service         Attending Physician: Dr. Daiva Eves    First Contact: Dr. Delane Ginger Pager: [phone]  Second Contact: Dr. Zada Girt Pager: [phone]       After Hours (After 5p/  First Contact Pager: [phone]  weekends / holidays): Second Contact Pager: [phone]   Chief Complaint: hematemesis  History of Present Illness:   The patient is a 43 yo woman, history of alcoholic cirrhosis with known esophageal varices, presenting with hematemesis.  Around 7pm on the day of admission, the patient notes 1 episode of vomiting with bright red blood, preceded by a few hours of nausea.  She notes recently taking 2 aspirin per day for the past 5 days, and notes 5 drinks of alcohol over the last 2 days prior to admission (BAC = 0.2 on admission).  She reports compliance with her outpatient medications, including propranolol and lactulose.  The patient notes mild increase in LE edema and abdominal distension, but no true abdominal pain, lightheadedness, or confusion.  The patient has a history of esophageal varices, s/p band ligation x4 09/2012 by Dr. Matthias Hughs.  Of note, the patient was recently admitted 1/28-1/29 with hepatic encephalopathy.  At the time of our exam, the patient is inebriated, having difficulty focusing on answering our questions, and slurring her words slightly.  Meds: No current facility-administered medications for this encounter.   Current Outpatient Prescriptions  Medication Sig Dispense Refill  . aspirin EC 325 MG tablet Take 325 mg by mouth every 6 (six) hours as needed for mild pain.      Marland Kitchen buPROPion (WELLBUTRIN XL) 150 MG 24 hr tablet Take 150 mg by mouth daily.      . folic acid (FOLVITE) 800 MCG tablet Take 800 mcg by mouth daily.      . furosemide (LASIX) 20 MG tablet Take 1 tablet (20 mg  total) by mouth 2 (two) times daily.  20 tablet  0  . lactulose (CHRONULAC) 10 GM/15ML solution Take 20 g by mouth 2 (two) times daily.      . Multiple Vitamins-Minerals (WOMENS ONE DAILY) TABS Take 1 tablet by mouth daily.      . pantoprazole (PROTONIX) 40 MG tablet Take 40 mg by mouth 2 (two) times daily.      . potassium chloride 20 MEQ/15ML (10%) solution Take 20 mEq by mouth 2 (two) times daily.      . propranolol (INDERAL) 20 MG tablet Take 20 mg by mouth daily.      . rifaximin (XIFAXAN) 550 MG TABS tablet Take 550 mg by mouth 2 (two) times daily.      Marland Kitchen spironolactone (ALDACTONE) 100 MG tablet Take 100 mg by mouth daily.      Marland Kitchen thiamine (VITAMIN B-1) 100 MG tablet Take 100 mg by mouth daily.      . traMADol (ULTRAM) 50 MG tablet Take 1 tablet (50 mg total) by mouth every 6 (six) hours as needed for moderate pain.  28 tablet  0  . ursodiol (ACTIGALL) 300 MG capsule Take 300 mg by mouth 2 (two) times daily.        Allergies: Allergies as of 02/17/2013  . (No Known Allergies)  Past Medical History  Diagnosis Date  . Sclerosing cholangitis   . Panic attacks   . Anxiety   . Alcohol abuse   . Ascites   . Cirrhosis of liver   . EtOH dependence 08/31/2012  . Family history of anesthesia complication     " my son had problems "   Past Surgical History  Procedure Laterality Date  . None    . Ercp    . Esophagogastroduodenoscopy N/A 09/10/2012    Procedure: ESOPHAGOGASTRODUODENOSCOPY (EGD);  Surgeon: Florencia Reasonsobert V Buccini, MD;  [facility];  Service: Endoscopy;  Laterality: N/A;  . Esophagogastroduodenoscopy N/A 11/04/2012    Procedure: ESOPHAGOGASTRODUODENOSCOPY (EGD);  Surgeon: Barrie FolkJohn C Hayes, MD;  [facility];  Service: Endoscopy;  Laterality: N/A;   Family History  Problem Relation Age of Onset  . CAD Father    History   Social History  . Marital Status: Single    Spouse Name: N/A    Number of Children: N/A  . Years of Education: N/A   Occupational  History  . Not on file.   Social History Main Topics  . Smoking status: Current Every Day Smoker -- 1.00 packs/day for 20 years    Types: Cigarettes  . Smokeless tobacco: Never Used  . Alcohol Use: Yes     Comment: Pt drinks vodka/liquor every weekend, denies daily drinking...states she quit a month ago....  . Drug Use: Yes    Special: Marijuana  . Sexual Activity: Yes    Birth Control/ Protection: IUD   Other Topics Concern  . Not on file   Social History Narrative  . No narrative on file    Review of Systems: General: no fevers, chills, changes in weight, changes in appetite Skin: no rash HEENT: no blurry vision, hearing changes, sore throat Pulm: no dyspnea, coughing, wheezing CV: no chest pain, palpitations, shortness of breath Abd: no abdominal pain, diarrhea/constipation GU: no dysuria, hematuria, polyuria Ext: no arthralgias, myalgias Neuro: no weakness, numbness, or tingling  Physical Exam: Blood pressure 93/41, pulse 100, temperature 98.2 F (36.8 C), temperature source Oral, resp. rate 18, SpO2 100.00%. General: drowsy but arouseable, slurring words slightly, tangential HEENT: PERRL, EOMI, oropharynx non-erythematous Neck: supple Lungs: clear to ascultation bilaterally, normal work of respiration, no wheezes, rales, ronchi Heart: regular rate and rhythm, no murmurs, gallops, or rubs Abdomen: soft, non-tender, moderately distended though not tense, normoactive bowel sounds, no guarding or rebound tenderness Extremities: 2+ bilateral pitting edema Neurologic: alert & oriented X3 though easily distracted, cranial nerves II-XII intact, strength grossly intact, sensation intact to light touch    Lab results: Basic Metabolic Panel:  Recent Labs  19/14/7802/16/15 0001  NA 139  K 4.1  CL 98  CO2 28  GLUCOSE 143*  BUN 20  CREATININE 0.94  CALCIUM 7.9*   Liver Function Tests:  Recent Labs  02/18/13 0001  AST 65*  ALT 22  ALKPHOS 280*  BILITOT 2.0*  PROT  7.2  ALBUMIN 2.5*    Recent Labs  02/18/13 0001  LIPASE 22    Recent Labs  02/18/13 0001  AMMONIA 10*   CBC:  Recent Labs  02/18/13 0001  WBC 7.9  NEUTROABS 4.5  HGB 8.4*  HCT 25.1*  MCV 93.7  PLT 233   Coagulation:  Recent Labs  02/18/13 0001  LABPROT 16.4*  INR 1.36   Urine Drug Screen: Drugs of Abuse     Component Value Date/Time   LABOPIA NONE DETECTED 01/31/2013 1030   COCAINSCRNUR  NONE DETECTED 01/31/2013 1030   LABBENZ NONE DETECTED 01/31/2013 1030   AMPHETMU NONE DETECTED 01/31/2013 1030   THCU NONE DETECTED 01/31/2013 1030   LABBARB NONE DETECTED 01/31/2013 1030    Alcohol Level:  Recent Labs  02/18/13 0001  ETH 209*     Assessment & Plan by Problem:  # Acute bleeding esophageal varices - likely exacerbated by recent aspirin and alcohol use.  Hb stable at 8.4, though patient likely mildly volume contracted (poor PO intake, mild elevation in BUN and Cr). -trend CBC q6 -if CBC stable, can likely discharge with outpatient GI follow-up for EGD -protonix 40 mg BID -no need for octreotide at this time, as Hb appears stable -start IVF, NS @ 100 cc/hr x5 hrs -zofran prn for nausea  # Alcohol intoxication - BAC = 0.21 -encourage cessation -CIWA protocol  # Prophy - SCD's  Dispo: Disposition is deferred at this time, awaiting improvement of current medical problems. Anticipated discharge in approximately 1-2 day(s).   The patient does have a current PCP ([place], PA-C) and does not need an [facility] hospital follow-up appointment after discharge.  Signed: Otis Brace, MD 02/18/2013, 4:01 AM

## 2013-02-18 NOTE — ED Notes (Signed)
Admitting doctor paged/called and notified of patients L BP of 79/37 and R BP of 87/43.

## 2013-02-18 NOTE — Progress Notes (Signed)
Pt. Has questions regarding plan of care for MD and would like to speak to MD when available.  MD paged.  Will continue to monitor. Vanice Sarahhompson, Ahri Olson L

## 2013-02-18 NOTE — Progress Notes (Signed)
Spoke with MD.  Notified pt. And updated pt. On plan of care per MD.  Will continue to monitor. Vanice Sarahhompson, Vanisha Whiten L

## 2013-02-18 NOTE — ED Notes (Signed)
Pt requesting pain medication.  MD Preston FleetingGlick notified and denied pain medication, pt advised.

## 2013-02-18 NOTE — ED Notes (Signed)
Pt given sprite per her request.  Rechecked BP, 89/39.  Dinamap Left BP 79/37 and Right BP 87/43.  Dr. Preston FleetingGlick notified.

## 2013-02-19 ENCOUNTER — Other Ambulatory Visit (HOSPITAL_COMMUNITY): Payer: Self-pay

## 2013-02-19 DIAGNOSIS — K7030 Alcoholic cirrhosis of liver without ascites: Principal | ICD-10-CM

## 2013-02-19 LAB — CBC
HEMATOCRIT: 23.2 % — AB (ref 36.0–46.0)
Hemoglobin: 7.5 g/dL — ABNORMAL LOW (ref 12.0–15.0)
MCH: 31.4 pg (ref 26.0–34.0)
MCHC: 32.3 g/dL (ref 30.0–36.0)
MCV: 97.1 fL (ref 78.0–100.0)
PLATELETS: 145 10*3/uL — AB (ref 150–400)
RBC: 2.39 MIL/uL — ABNORMAL LOW (ref 3.87–5.11)
RDW: 20 % — ABNORMAL HIGH (ref 11.5–15.5)
WBC: 4.5 10*3/uL (ref 4.0–10.5)

## 2013-02-19 NOTE — Discharge Instructions (Signed)
Please call your primary physician for an appointment as soon as possible.    Please refrain from alcohol use or consider AA or an inpatient treatment facility.  Your current medical regimen is effective;  continue present plan and all medications. Please be sure to bring all of your medications with you to every visit.  If you believe that you are suffering from a life threatening condition or one that may result in the loss of limb or function, then you should call 911 or proceed to the nearest Emergency Department.

## 2013-02-19 NOTE — Discharge Summary (Signed)
Name: [name] MRN: [id number] DOB: March 30, 1970 43 y.o. PCP: Tanna FurryBrittany Hout, PA-C . Date of Admission: 02/17/2013 11:22 PM Date of Discharge: 02/19/2013 Attending Physician: Gardiner Barefootobert W Comer, MD  Discharge Diagnosis: Principal Problem:   Bleeding esophageal varices in alcoholic cirrhosis Active Problems:   EtOH dependence   Cirrhosis of liver  Discharge Medications:   Medication List         aspirin EC 325 MG tablet  Take 325 mg by mouth every 6 (six) hours as needed for mild pain.     buPROPion 150 MG 24 hr tablet  Commonly known as:  WELLBUTRIN XL  Take 150 mg by mouth daily.     folic acid 800 MCG tablet  Commonly known as:  FOLVITE  Take 800 mcg by mouth daily.     furosemide 20 MG tablet  Commonly known as:  LASIX  Take 1 tablet (20 mg total) by mouth 2 (two) times daily.     lactulose 10 GM/15ML solution  Commonly known as:  CHRONULAC  Take 20 g by mouth 2 (two) times daily.     pantoprazole 40 MG tablet  Commonly known as:  PROTONIX  Take 40 mg by mouth 2 (two) times daily.     potassium chloride 20 MEQ/15ML (10%) solution  Take 20 mEq by mouth 2 (two) times daily.     propranolol 20 MG tablet  Commonly known as:  INDERAL  Take 20 mg by mouth daily.     rifaximin 550 MG Tabs tablet  Commonly known as:  XIFAXAN  Take 550 mg by mouth 2 (two) times daily.     spironolactone 100 MG tablet  Commonly known as:  ALDACTONE  Take 100 mg by mouth daily.     thiamine 100 MG tablet  Commonly known as:  VITAMIN B-1  Take 100 mg by mouth daily.     traMADol 50 MG tablet  Commonly known as:  ULTRAM  Take 1 tablet (50 mg total) by mouth every 6 (six) hours as needed for moderate pain.     ursodiol 300 MG capsule  Commonly known as:  ACTIGALL  Take 300 mg by mouth 2 (two) times daily.     WOMENS ONE DAILY Tabs  Take 1 tablet by mouth daily.        Disposition and follow-up:   [name] was [facility] in Stable  condition.  Please address the following problems:  1. Continued alcohol use and bleeding varices; compliance with meds with further adjustment as needed     Labs / imaging needed at time of follow-up: CBC  Pending labs/ test needing follow-up: None  Follow-up Appointments: Follow-up Information   Schedule an appointment as soon as possible for a visit with Shirley FriarSCHOOLER,VINCENT C., MD.   Specialty:  Gastroenterology   Contact information:   [address] [place] [address] [phone]       Schedule an appointment as soon as possible for a visit with HOUT, BRITTANY, PA-C.   Specialty:  Physician Assistant   Contact information:   [facility] [address] [place] [address] [phone]       Discharge Instructions: Discharge Orders   Future Orders Complete By Expires   Call MD for:  difficulty breathing, headache or visual disturbances  As directed    Call MD for:  extreme fatigue  As directed    Call MD for:  persistant nausea and vomiting  As directed    Diet - low sodium heart healthy  As directed    Increase activity slowly  As directed       Consultations:    Procedures Performed:  US Abdomen Limited  01/30/2013   CLINICAL DATA:  43 year old female with altered mental status. History of cirrhosis and recurrent ascites. Initial encounter.  EXAM: US ABDOMEN LIMITED - ascites  COMPARISON:  09/14/2012 and earlier.  FINDINGS: Small volume of free fluid in the left upper and lower quadrants, quantity insufficient for paracentesis. No definite additional free fluid.  IMPRESSION: Trace ascites, quantity insufficient for paracentesis.   Electronically Signed   By: Augusto Gamble M.D.   On: 01/30/2013 15:51    2D Echo: N/A  Cardiac Cath: N/A  Admission HPI: The patient is a 43 yo woman, history of alcoholic cirrhosis with known esophageal varices, presenting with hematemesis. Around 7pm on the day of admission, the patient notes 1 episode of  vomiting with bright red blood, preceded by a few hours of nausea. She notes recently taking 2 aspirin per day for the past 5 days, and notes 5 drinks of alcohol over the last 2 days prior to admission (BAC = 0.2 on admission). She reports compliance with her outpatient medications, including propranolol and lactulose. The patient notes mild increase in LE edema and abdominal distension, but no true abdominal pain, lightheadedness, or confusion. The patient has a history of esophageal varices, s/p band ligation x4 09/2012 by Dr. Matthias Hughs. Of note, the patient was recently admitted 1/28-1/29 with hepatic encephalopathy. At the time of our exam, the patient is inebriated, having difficulty focusing on answering our questions, and slurring her words slightly.   Hospital Course by problem list: Principal Problem:   Bleeding esophageal varices in alcoholic cirrhosis Active Problems:   EtOH dependence   Cirrhosis of liver   # Acute bleeding esophageal varices - Most likely exacerbated by recent aspirin and alcohol use. Hb stable at 8.4, though patient likely mildly volume contracted (poor PO intake, mild elevation in BUN and Cr).  We trended CBC q6hr and remained relatively stable.  She was started on protonix 40mg  bid and given NS 167ml/hr for 5 hours.  She was discharged without any further episodes of bleeding to follow up with gastroenterology and her PCP.  # Alcohol intoxication - Pt was admitted with an BAC = 0.21 and was put on a CIWA protocol.  We discussed her drinking habits and recommended cessation.   # Prophy - SCD's  Discharge Vitals:   BP 112/58  Pulse 78  Temp(Src) 98.7 F (37.1 C) (Oral)  Resp 20  Ht 5\' 4"  (1.626 m)  Wt 172 lb 8 oz (78.245 kg)  BMI 29.59 kg/m2  SpO2 98%  Discharge Labs:  Results for orders placed during the hospital encounter of 02/17/13 (from the past 24 hour(s))  CBC     Status: Abnormal   Collection Time    02/18/13  1:00 PM      Result Value Ref Range    WBC 5.2  4.0 - 10.5 K/uL   RBC 2.44 (*) 3.87 - 5.11 MIL/uL   Hemoglobin 7.8 (*) 12.0 - 15.0 g/dL   HCT 16.1 (*) 09.6 - 04.5 %   MCV 95.5  78.0 - 100.0 fL   MCH 32.0  26.0 - 34.0 pg   MCHC 33.5  30.0 - 36.0 g/dL   RDW 40.9 (*) 81.1 - 91.4 %   Platelets 173  150 - 400 K/uL  CBC     Status: Abnormal   Collection Time    02/18/13  6:37 PM      Result Value Ref Range   WBC 4.6  4.0 - 10.5 K/uL   RBC 2.53 (*) 3.87 - 5.11 MIL/uL   Hemoglobin 8.0 (*) 12.0 - 15.0 g/dL   HCT 16.1 (*) 09.6 - 04.5 %   MCV 97.2  78.0 - 100.0 fL   MCH 31.6  26.0 - 34.0 pg   MCHC 32.5  30.0 - 36.0 g/dL   RDW 40.9 (*) 81.1 - 91.4 %   Platelets 178  150 - 400 K/uL  CBC     Status: Abnormal   Collection Time    02/19/13  4:29 AM      Result Value Ref Range   WBC 4.5  4.0 - 10.5 K/uL   RBC 2.39 (*) 3.87 - 5.11 MIL/uL   Hemoglobin 7.5 (*) 12.0 - 15.0 g/dL   HCT 78.2 (*) 95.6 - 21.3 %   MCV 97.1  78.0 - 100.0 fL   MCH 31.4  26.0 - 34.0 pg   MCHC 32.3  30.0 - 36.0 g/dL   RDW 08.6 (*) 57.8 - 46.9 %   Platelets 145 (*) 150 - 400 K/uL    Signed: Boykin Peek, MD [phone] 02/19/2013, 12:02 PM   Time Spent on Discharge: Services Ordered on Discharge: None Equipment Ordered on Discharge: None

## 2013-02-19 NOTE — Progress Notes (Signed)
DC home with boyfriend, verbally understood DC instructions, no questions asked 

## 2013-02-21 NOTE — H&P (Signed)
  Date: 02/21/2013  Patient name: [name]  Medical record number: [id number]  Date of birth: 1970-06-12   I have seen and evaluated [name] and discussed their care with the Residency Team.   Assessment and Plan: I have seen and evaluated the patient as outlined above. I agree with the formulated Assessment and Plan as detailed in the residents' admission note.  Gardiner Barefootobert W Comer, MD 2/19/201510:06 AM

## 2013-02-25 ENCOUNTER — Other Ambulatory Visit (HOSPITAL_COMMUNITY): Payer: Self-pay | Admitting: *Deleted

## 2013-02-25 ENCOUNTER — Inpatient Hospital Stay (HOSPITAL_COMMUNITY)
Admission: RE | Admit: 2013-02-25 | Discharge: 2013-02-25 | Disposition: A | Discharge status: Home / Self Care | Payer: Self-pay | Source: Ambulatory Visit

## 2013-02-25 NOTE — Discharge Summary (Signed)
Agree with plan as outlined in summary.  

## 2013-02-25 NOTE — Pre-Procedure Instructions (Signed)
[name]  02/25/2013   Your procedure is scheduled on:  Tuesday, February 26, 2013 at 9:15 AM.   [facility] En[facility] "A" Admitting Office at 7:15 AM.   Call this number if you have problems the morning of surgery: [phone]   Remember:   Do not eat food or drink liquids after midnight tonight.   Take these medicines the morning of surgery with A SIP OF WATER: buPROPion (WELLBUTRIN XL), pantoprazole (PROTONIX), propranolol (INDERAL). You may take your traMADol [name](ULTRAM) if needed for pain.    Do not wear jewelry, make-up or nail polish.  Do not wear lotions, powders, or perfumes. You may wear deodorant.  Do not shave 48 hours prior to surgery.   Do not bring valuables to the hospital.  [facility] is not responsible                  for any belongings or valuables.               Contacts, dentures or bridgework may not be worn into surgery.  Leave suitcase in the car. After surgery it may be brought to your room.  For patients admitted to the hospital, discharge time is determined by your                treatment team.               Patients discharged the day of surgery will not be allowed to drive  home.  Name and phone number of your driver: Family/friend   Special Instructions: [name] - Preparing for Surgery  Before surgery, you can play an important role.  Because skin is not sterile, your skin needs to be as free of germs as possible.  You can reduce the number of germs on you skin by washing with CHG (chlorahexidine gluconate) soap before surgery.  CHG is an antiseptic cleaner which kills germs and bonds with the skin to continue killing germs even after washing.  Please DO NOT use if you have an allergy to CHG or antibacterial soaps.  If your skin becomes reddened/irritated stop using the CHG and inform your nurse when you arrive at Short Stay.  Do not shave (including legs and underarms) for at least 48 hours prior to the first CHG shower.  You may  shave your face.  Please follow these instructions carefully:   1.  Shower with CHG Soap the night before surgery and the                                morning of Surgery.  2.  If you choose to wash your hair, wash your hair first as usual with your       normal shampoo.  3.  After you shampoo, rinse your hair and body thoroughly to remove the                      Shampoo.  4.  Use CHG as you would any other liquid soap.  You can apply chg directly       to the skin and wash gently with scrungie or a clean washcloth.  5.  Apply the CHG Soap to your body ONLY FROM THE NECK DOWN.        Do not use on open wounds or open sores.  Avoid contact with your eyes, ears, mouth and genitals (  private parts).  Wash genitals (private parts) with your normal soap.  6.  Wash thoroughly, paying special attention to the area where your surgery        will be performed.  7.  Thoroughly rinse your body with warm water from the neck down.  8.  DO NOT shower/wash with your normal soap after using and rinsing off       the CHG Soap.  9.  Pat yourself dry with a clean towel.            10.  Wear clean pajamas.            11.  Place clean sheets on your bed the night of your first shower and do not        sleep with pets.  Day of Surgery  Do not apply any lotions the morning of surgery.  Please wear clean clothes to the hospital/surgery center.     Please read over the following fact sheets that you were given: Pain Booklet, Coughing and Deep Breathing and Surgical Site Infection Prevention

## 2013-02-25 NOTE — Progress Notes (Signed)
Pt had not arrived by 9:10 AM for 9:00 AM PAT appt. Called and spoke with pt's mother and she states pt has called Dr. Randa EvensJensen's office and will be rescheduling surgery. Unable to have surgery tomorrow due to running a fever.

## 2013-02-26 ENCOUNTER — Encounter (HOSPITAL_COMMUNITY): Admission: RE | Payer: Self-pay | Source: Ambulatory Visit

## 2013-02-26 ENCOUNTER — Ambulatory Visit (HOSPITAL_COMMUNITY): Admission: RE | Admit: 2013-02-26 | Payer: Medicaid Other | Source: Ambulatory Visit | Admitting: Oral Surgery

## 2013-02-26 SURGERY — DENTAL RESTORATION/EXTRACTIONS
Anesthesia: Choice

## 2013-03-05 ENCOUNTER — Inpatient Hospital Stay (HOSPITAL_COMMUNITY)
Admission: EM | Admit: 2013-03-05 | Discharge: 2013-03-10 | DRG: 432 | Disposition: A | Discharge status: Home / Self Care | Payer: Medicaid Other | Attending: Internal Medicine | Admitting: Internal Medicine

## 2013-03-05 ENCOUNTER — Encounter (HOSPITAL_COMMUNITY): Payer: Self-pay | Admitting: Emergency Medicine

## 2013-03-05 DIAGNOSIS — F329 Major depressive disorder, single episode, unspecified: Secondary | ICD-10-CM | POA: Diagnosis present

## 2013-03-05 DIAGNOSIS — M549 Dorsalgia, unspecified: Secondary | ICD-10-CM | POA: Diagnosis present

## 2013-03-05 DIAGNOSIS — K7030 Alcoholic cirrhosis of liver without ascites: Principal | ICD-10-CM | POA: Diagnosis present

## 2013-03-05 DIAGNOSIS — K2970 Gastritis, unspecified, without bleeding: Secondary | ICD-10-CM | POA: Diagnosis present

## 2013-03-05 DIAGNOSIS — I959 Hypotension, unspecified: Secondary | ICD-10-CM | POA: Diagnosis present

## 2013-03-05 DIAGNOSIS — D649 Anemia, unspecified: Secondary | ICD-10-CM

## 2013-03-05 DIAGNOSIS — F3289 Other specified depressive episodes: Secondary | ICD-10-CM | POA: Diagnosis present

## 2013-03-05 DIAGNOSIS — Z79899 Other long term (current) drug therapy: Secondary | ICD-10-CM

## 2013-03-05 DIAGNOSIS — D696 Thrombocytopenia, unspecified: Secondary | ICD-10-CM | POA: Diagnosis present

## 2013-03-05 DIAGNOSIS — M25559 Pain in unspecified hip: Secondary | ICD-10-CM | POA: Diagnosis present

## 2013-03-05 DIAGNOSIS — F1010 Alcohol abuse, uncomplicated: Secondary | ICD-10-CM | POA: Diagnosis present

## 2013-03-05 DIAGNOSIS — Z8249 Family history of ischemic heart disease and other diseases of the circulatory system: Secondary | ICD-10-CM

## 2013-03-05 DIAGNOSIS — Z7982 Long term (current) use of aspirin: Secondary | ICD-10-CM

## 2013-03-05 DIAGNOSIS — F17200 Nicotine dependence, unspecified, uncomplicated: Secondary | ICD-10-CM | POA: Diagnosis present

## 2013-03-05 DIAGNOSIS — F411 Generalized anxiety disorder: Secondary | ICD-10-CM | POA: Diagnosis present

## 2013-03-05 DIAGNOSIS — K922 Gastrointestinal hemorrhage, unspecified: Secondary | ICD-10-CM | POA: Diagnosis present

## 2013-03-05 DIAGNOSIS — K7460 Unspecified cirrhosis of liver: Secondary | ICD-10-CM | POA: Diagnosis present

## 2013-03-05 DIAGNOSIS — I8511 Secondary esophageal varices with bleeding: Secondary | ICD-10-CM | POA: Diagnosis present

## 2013-03-05 DIAGNOSIS — R17 Unspecified jaundice: Secondary | ICD-10-CM | POA: Diagnosis present

## 2013-03-05 DIAGNOSIS — F410 Panic disorder [episodic paroxysmal anxiety] without agoraphobia: Secondary | ICD-10-CM | POA: Diagnosis present

## 2013-03-05 DIAGNOSIS — I8510 Secondary esophageal varices without bleeding: Secondary | ICD-10-CM | POA: Diagnosis present

## 2013-03-05 DIAGNOSIS — K920 Hematemesis: Secondary | ICD-10-CM | POA: Diagnosis present

## 2013-03-05 DIAGNOSIS — K2990 Gastroduodenitis, unspecified, without bleeding: Secondary | ICD-10-CM

## 2013-03-05 DIAGNOSIS — F1020 Alcohol dependence, uncomplicated: Secondary | ICD-10-CM

## 2013-03-05 DIAGNOSIS — R188 Other ascites: Secondary | ICD-10-CM | POA: Diagnosis present

## 2013-03-05 DIAGNOSIS — K759 Inflammatory liver disease, unspecified: Secondary | ICD-10-CM | POA: Diagnosis present

## 2013-03-05 LAB — AMMONIA: AMMONIA: 35 umol/L (ref 11–60)

## 2013-03-05 LAB — CBC WITH DIFFERENTIAL/PLATELET
BASOS ABS: 0.1 10*3/uL (ref 0.0–0.1)
Basophils Relative: 1 % (ref 0–1)
Eosinophils Absolute: 0.1 10*3/uL (ref 0.0–0.7)
Eosinophils Relative: 3 % (ref 0–5)
HCT: 21.6 % — ABNORMAL LOW (ref 36.0–46.0)
HEMOGLOBIN: 6.9 g/dL — AB (ref 12.0–15.0)
LYMPHS ABS: 1.6 10*3/uL (ref 0.7–4.0)
LYMPHS PCT: 30 % (ref 12–46)
MCH: 31.4 pg (ref 26.0–34.0)
MCHC: 31.9 g/dL (ref 30.0–36.0)
MCV: 98.2 fL (ref 78.0–100.0)
Monocytes Absolute: 0.6 10*3/uL (ref 0.1–1.0)
Monocytes Relative: 12 % (ref 3–12)
NEUTROS ABS: 2.8 10*3/uL (ref 1.7–7.7)
Neutrophils Relative %: 54 % (ref 43–77)
Platelets: 158 10*3/uL (ref 150–400)
RBC: 2.2 MIL/uL — AB (ref 3.87–5.11)
RDW: 20.6 % — ABNORMAL HIGH (ref 11.5–15.5)
WBC: 5.2 10*3/uL (ref 4.0–10.5)

## 2013-03-05 LAB — PROTIME-INR
INR: 1.98 — ABNORMAL HIGH (ref 0.00–1.49)
Prothrombin Time: 21.9 seconds — ABNORMAL HIGH (ref 11.6–15.2)

## 2013-03-05 LAB — COMPREHENSIVE METABOLIC PANEL
ALT: 17 U/L (ref 0–35)
AST: 52 U/L — ABNORMAL HIGH (ref 0–37)
Albumin: 2.1 g/dL — ABNORMAL LOW (ref 3.5–5.2)
Alkaline Phosphatase: 284 U/L — ABNORMAL HIGH (ref 39–117)
BILIRUBIN TOTAL: 1.3 mg/dL — AB (ref 0.3–1.2)
BUN: 16 mg/dL (ref 6–23)
CHLORIDE: 109 meq/L (ref 96–112)
CO2: 24 meq/L (ref 19–32)
Calcium: 7.8 mg/dL — ABNORMAL LOW (ref 8.4–10.5)
Creatinine, Ser: 0.89 mg/dL (ref 0.50–1.10)
GFR calc Af Amer: >90 mL/min (ref >90)
GFR, EST NON AFRICAN AMERICAN: 78 mL/min — AB (ref >90)
Glucose, Bld: 99 mg/dL (ref 70–99)
Potassium: 3.7 mEq/L (ref 3.7–5.3)
Sodium: 143 mEq/L (ref 137–147)
Total Protein: 6 g/dL (ref 6.0–8.3)

## 2013-03-05 LAB — PREPARE RBC (CROSSMATCH)

## 2013-03-05 LAB — POC OCCULT BLOOD, ED: Fecal Occult Bld: POSITIVE — AB

## 2013-03-05 LAB — TROPONIN I: Troponin I: <0.3 ng/mL (ref <0.30)

## 2013-03-05 LAB — LACTIC ACID, PLASMA: LACTIC ACID, VENOUS: 0.8 mmol/L (ref 0.5–2.2)

## 2013-03-05 MED ORDER — SODIUM CHLORIDE 0.9 % IV SOLN
50.0000 ug/h | INTRAVENOUS | Status: DC
  Administered 2013-03-05 – 2013-03-09 (×7): 50 ug/h via INTRAVENOUS
  Filled 2013-03-05 (×16): qty 1

## 2013-03-05 MED ORDER — SODIUM CHLORIDE 0.9 % IV SOLN
INTRAVENOUS | Status: DC
  Administered 2013-03-05 – 2013-03-06 (×3): via INTRAVENOUS

## 2013-03-05 MED ORDER — LORAZEPAM 2 MG/ML IJ SOLN: 1.0000 mg | Freq: Four times a day (QID) | INTRAMUSCULAR | Status: AC | PRN

## 2013-03-05 MED ORDER — LACTULOSE 10 GM/15ML PO SOLN
20.0000 g | Freq: Two times a day (BID) | ORAL | Status: DC
  Administered 2013-03-05 – 2013-03-10 (×6): 20 g via ORAL
  Filled 2013-03-05 (×11): qty 30

## 2013-03-05 MED ORDER — ADULT MULTIVITAMIN W/MINERALS CH
1.0000 | ORAL_TABLET | Freq: Every day | ORAL | Status: DC
  Administered 2013-03-06 – 2013-03-10 (×5): 1 via ORAL
  Filled 2013-03-05 (×5): qty 1

## 2013-03-05 MED ORDER — ONDANSETRON HCL 4 MG PO TABS: 4.0000 mg | ORAL_TABLET | Freq: Four times a day (QID) | ORAL | Status: DC | PRN

## 2013-03-05 MED ORDER — PANTOPRAZOLE SODIUM 40 MG PO TBEC
40.0000 mg | DELAYED_RELEASE_TABLET | Freq: Two times a day (BID) | ORAL | Status: DC
Start: 2013-03-05 — End: 2013-03-06
  Administered 2013-03-05: 40 mg via ORAL
  Filled 2013-03-05: qty 1

## 2013-03-05 MED ORDER — RIFAXIMIN 550 MG PO TABS
550.0000 mg | ORAL_TABLET | Freq: Two times a day (BID) | ORAL | Status: DC
  Administered 2013-03-05 – 2013-03-10 (×10): 550 mg via ORAL
  Filled 2013-03-05 (×11): qty 1

## 2013-03-05 MED ORDER — FOLIC ACID 800 MCG PO TABS: 800.0000 ug | ORAL_TABLET | Freq: Every day | ORAL | Status: DC

## 2013-03-05 MED ORDER — SODIUM CHLORIDE 0.9 % IV BOLUS (SEPSIS)
1000.0000 mL | Freq: Once | INTRAVENOUS | Status: AC
  Administered 2013-03-05: 1000 mL via INTRAVENOUS

## 2013-03-05 MED ORDER — DEXTROSE 5 % IV SOLN
1.0000 g | Freq: Once | INTRAVENOUS | Status: AC
  Administered 2013-03-05: 1 g via INTRAVENOUS
  Filled 2013-03-05: qty 10

## 2013-03-05 MED ORDER — SODIUM CHLORIDE 0.9 % IJ SOLN
3.0000 mL | Freq: Two times a day (BID) | INTRAMUSCULAR | Status: DC
  Administered 2013-03-05 – 2013-03-09 (×7): 3 mL via INTRAVENOUS

## 2013-03-05 MED ORDER — TRAMADOL HCL 50 MG PO TABS
50.0000 mg | ORAL_TABLET | Freq: Four times a day (QID) | ORAL | Status: DC | PRN
  Administered 2013-03-05 – 2013-03-10 (×9): 50 mg via ORAL
  Filled 2013-03-05 (×9): qty 1

## 2013-03-05 MED ORDER — VITAMIN B-1 100 MG PO TABS
100.0000 mg | ORAL_TABLET | Freq: Every day | ORAL | Status: DC
  Administered 2013-03-06 – 2013-03-10 (×5): 100 mg via ORAL
  Filled 2013-03-05 (×5): qty 1

## 2013-03-05 MED ORDER — FOLIC ACID 1 MG PO TABS
1.0000 mg | ORAL_TABLET | Freq: Every day | ORAL | Status: DC
  Administered 2013-03-06 – 2013-03-10 (×5): 1 mg via ORAL
  Filled 2013-03-05 (×5): qty 1

## 2013-03-05 MED ORDER — PANTOPRAZOLE SODIUM 40 MG IV SOLR
40.0000 mg | Freq: Once | INTRAVENOUS | Status: AC
  Administered 2013-03-05: 40 mg via INTRAVENOUS
  Filled 2013-03-05: qty 40

## 2013-03-05 MED ORDER — URSODIOL 300 MG PO CAPS
300.0000 mg | ORAL_CAPSULE | Freq: Two times a day (BID) | ORAL | Status: DC
  Administered 2013-03-05 – 2013-03-10 (×10): 300 mg via ORAL
  Filled 2013-03-05 (×11): qty 1

## 2013-03-05 MED ORDER — DEXTROSE 5 % IV SOLN
1.0000 g | INTRAVENOUS | Status: DC
  Filled 2013-03-05: qty 10

## 2013-03-05 MED ORDER — ONDANSETRON HCL 4 MG/2ML IJ SOLN
4.0000 mg | Freq: Once | INTRAMUSCULAR | Status: AC
  Administered 2013-03-05: 4 mg via INTRAVENOUS
  Filled 2013-03-05: qty 2

## 2013-03-05 MED ORDER — LORAZEPAM 1 MG PO TABS: 1.0000 mg | ORAL_TABLET | Freq: Four times a day (QID) | ORAL | Status: AC | PRN

## 2013-03-05 MED ORDER — WOMENS ONE DAILY PO TABS: 1.0000 | ORAL_TABLET | Freq: Every day | ORAL | Status: DC

## 2013-03-05 MED ORDER — BUPROPION HCL ER (XL) 150 MG PO TB24
150.0000 mg | ORAL_TABLET | Freq: Every day | ORAL | Status: DC
Start: 2013-03-06 — End: 2013-03-10
  Administered 2013-03-06 – 2013-03-10 (×5): 150 mg via ORAL
  Filled 2013-03-05 (×5): qty 1

## 2013-03-05 MED ORDER — ONDANSETRON HCL 4 MG/2ML IJ SOLN
4.0000 mg | Freq: Four times a day (QID) | INTRAMUSCULAR | Status: DC | PRN
  Administered 2013-03-10: 4 mg via INTRAVENOUS
  Filled 2013-03-05: qty 2

## 2013-03-05 NOTE — ED Notes (Signed)
Admitting physician at bedside

## 2013-03-05 NOTE — ED Notes (Signed)
IV team called for 2nd IV site.

## 2013-03-05 NOTE — ED Notes (Signed)
IV team at PT bedside for 2nd IV start.

## 2013-03-05 NOTE — ED Notes (Signed)
PT vomited blood today and called EMS. Pt reports feeling dizzy . PT A/O and speaking in full sentences.

## 2013-03-05 NOTE — ED Notes (Signed)
IV team returned call and will come start 2nd IV.

## 2013-03-05 NOTE — ED Notes (Signed)
Admitting MD at BS.  

## 2013-03-05 NOTE — Progress Notes (Signed)
We were notified of pt's presentation to ER w/ hematemesis and will plan to see her in the morning, or sooner if she destabilizes.  She is followed in our office by Dr. Bosie ClosSchooler, who last saw her 3 wks ago, at which time hgb was 8.7  Pt had variceal bleed, w/ banding performed, last year.  At present, per d/w ER MD (Dr. Hendricks MiloWoffard), pt appears stable w/ no hematemesis for the past several hrs, not shocky, mentating well, not tachycardic but w/ lowish BP (she normally runs somewhat low).  Her hgb is down about 2 gm from 3 wks ago.  She is getting octreotide and is about to get a unit of prc's.  Plan:    1.  We will plan on formal consultation in the morning, but could see her and/or endoscope her emergently this evening if her condition destabilizes in the meantime.  2.  Recomm octreotide at 50 mcg/hr, empiric PPI, and antbx b/o presumed variceal bleeding in a cirrhotic patient.  3.  Timing of endoscopy will depend on pt's clinical evolution--if she remains hemodynamically stable and without evidence of further active bleeding on medical therapy, it is probably safer to defer endoscopy until BP and hgb have been optimized and any blood in stomach has had a chance to empty out.  If she shows evidence of active, ongoing, destabilizing bleeding, she would probably benefit from urgent endoscopy despite the higher risk.  4.  Please call at any time tonight if pt becomes unstable or you have any questions pertaining to her mgt  Florencia Reasonsobert V. Kiril Hippe, M.D. [phone]

## 2013-03-05 NOTE — ED Provider Notes (Signed)
CSN: [id number]     Arrival date & time 03/05/13  1554 History   First MD Initiated Contact with Patient 03/05/13 1557     Chief Complaint  Patient presents with  . Hematemesis     (Consider location/radiation/quality/duration/timing/severity/associated sxs/prior Treatment) HPI Comments: 43 yo female with hx of Cirrhosis presenting with hematemesis.  She reports multiple episodes over last hour of BRB with some clots.  Has a history of varices.  Patient is a 42 y.o. female presenting with GI illness.  GI Problem This is a recurrent problem. The current episode started less than 1 hour ago. Episode frequency: several times. The problem has been gradually improving. Pertinent negatives include no chest pain, no abdominal pain and no shortness of breath. Associated symptoms comments: dizziness. Nothing aggravates the symptoms. Nothing relieves the symptoms.    Past Medical History  Diagnosis Date  . Sclerosing cholangitis   . Panic attacks   . Anxiety   . Alcohol abuse   . Ascites   . Cirrhosis of liver   . EtOH dependence 08/31/2012  . Family history of anesthesia complication     " my son had problems "   Past Surgical History  Procedure Laterality Date  . None    . Ercp    . Esophagogastroduodenoscopy N/A 09/10/2012    Procedure: ESOPHAGOGASTRODUODENOSCOPY (EGD);  Surgeon: Florencia Reasons, MD;  Location: [facility] ENDOSCOPY;  Service: Endoscopy;  Laterality: N/A;  . Esophagogastroduodenoscopy N/A 11/04/2012    Procedure: ESOPHAGOGASTRODUODENOSCOPY (EGD);  Surgeon: Barrie Folk, MD;  Location: [facility] ENDOSCOPY;  Service: Endoscopy;  Laterality: N/A;   Family History  Problem Relation Age of Onset  . CAD Father    History  Substance Use Topics  . Smoking status: Current Every Day Smoker -- 1.00 packs/day for 20 years    Types: Cigarettes  . Smokeless tobacco: Never Used  . Alcohol Use: Yes     Comment: Pt drinks vodka/liquor every weekend, denies daily drinking...states she quit a  month ago....   OB History   Grav Para Term Preterm Abortions TAB SAB Ect Mult Living                 Review of Systems  Respiratory: Negative for cough and shortness of breath.   Cardiovascular: Negative for chest pain.  Gastrointestinal: Negative for nausea, vomiting, abdominal pain and diarrhea.  All other systems reviewed and are negative.      Allergies  Review of patient's allergies indicates no known allergies.  Home Medications   Current Outpatient Rx  Name  Route  Sig  Dispense  Refill  . aspirin EC 325 MG tablet   Oral   Take 325 mg by mouth every 6 (six) hours as needed for mild pain.         Marland Kitchen buPROPion (WELLBUTRIN XL) 150 MG 24 hr tablet   Oral   Take 150 mg by mouth daily.         . folic acid (FOLVITE) 800 MCG tablet   Oral   Take 800 mcg by mouth daily.         . furosemide (LASIX) 20 MG tablet   Oral   Take 1 tablet (20 mg total) by mouth 2 (two) times daily.   20 tablet   0   . lactulose (CHRONULAC) 10 GM/15ML solution   Oral   Take 20 g by mouth 2 (two) times daily.         . Multiple Vitamins-Minerals (WOMENS ONE DAILY)  TABS   Oral   Take 1 tablet by mouth daily.         . pantoprazole (PROTONIX) 40 MG tablet   Oral   Take 40 mg by mouth 2 (two) times daily.         . potassium chloride 20 MEQ/15ML (10%) solution   Oral   Take 20 mEq by mouth 2 (two) times daily.         . propranolol (INDERAL) 20 MG tablet   Oral   Take 20 mg by mouth daily.         . rifaximin (XIFAXAN) 550 MG TABS tablet   Oral   Take 550 mg by mouth 2 (two) times daily.         Marland Kitchen spironolactone (ALDACTONE) 100 MG tablet   Oral   Take 100 mg by mouth daily.         Marland Kitchen thiamine (VITAMIN B-1) 100 MG tablet   Oral   Take 100 mg by mouth daily.         . traMADol (ULTRAM) 50 MG tablet   Oral   Take 1 tablet (50 mg total) by mouth every 6 (six) hours as needed for moderate pain.   28 tablet   0   . ursodiol (ACTIGALL) 300 MG  capsule   Oral   Take 300 mg by mouth 2 (two) times daily.          SpO2 100% Physical Exam  Nursing note and vitals reviewed. Constitutional: She is oriented to person, place, and time. She appears well-developed and well-nourished. No distress.  HENT:  Head: Normocephalic and atraumatic.  Mouth/Throat: Oropharynx is clear and moist.  Eyes: Conjunctivae are normal. Pupils are equal, round, and reactive to light. No scleral icterus.  Neck: Neck supple.  Cardiovascular: Normal rate, regular rhythm, normal heart sounds and intact distal pulses.   No murmur heard. Pulmonary/Chest: Effort normal and breath sounds normal. No stridor. No respiratory distress. She has no rales.  Abdominal: Soft. Bowel sounds are normal. She exhibits distension (mild). There is no tenderness. There is no rigidity, no rebound and no guarding.  Musculoskeletal: Normal range of motion.  Neurological: She is alert and oriented to person, place, and time.  Skin: Skin is warm and dry. No rash noted.  Psychiatric: She has a normal mood and affect. Her behavior is normal.    ED Course  CRITICAL CARE Performed by: Blake Divine DAVID III Authorized by: Blake Divine DAVID III Total critical care time: 35 minutes Critical care time was exclusive of separately billable procedures and treating other patients. Critical care was necessary to treat or prevent imminent or life-threatening deterioration of the following conditions: circulatory failure. Critical care was time spent personally by me on the following activities: development of treatment plan with patient or surrogate, discussions with consultants, evaluation of patient's response to treatment, examination of patient, obtaining history from patient or surrogate, ordering and performing treatments and interventions, ordering and review of laboratory studies, ordering and review of radiographic studies, pulse oximetry, re-evaluation of patient's condition and  review of old charts.   (including critical care time) Labs Review Labs Reviewed  CBC WITH DIFFERENTIAL - Abnormal; Notable for the following:    RBC 2.20 (*)    Hemoglobin 6.9 (*)    HCT 21.6 (*)    RDW 20.6 (*)    All other components within normal limits  COMPREHENSIVE METABOLIC PANEL - Abnormal; Notable for the following:    Calcium  7.8 (*)    Albumin 2.1 (*)    AST 52 (*)    Alkaline Phosphatase 284 (*)    Total Bilirubin 1.3 (*)    GFR calc non Af Amer 78 (*)    All other components within normal limits  PROTIME-INR - Abnormal; Notable for the following:    Prothrombin Time 21.9 (*)    INR 1.98 (*)    All other components within normal limits  CBC - Abnormal; Notable for the following:    RBC 2.26 (*)    Hemoglobin 7.0 (*)    HCT 21.3 (*)    RDW 20.0 (*)    Platelets 128 (*)    All other components within normal limits  POC OCCULT BLOOD, ED - Abnormal; Notable for the following:    Fecal Occult Bld POSITIVE (*)    All other components within normal limits  MRSA PCR SCREENING  AMMONIA  LACTIC ACID, PLASMA  TROPONIN I  TROPONIN I  TROPONIN I  URINALYSIS, ROUTINE W REFLEX MICROSCOPIC  BASIC METABOLIC PANEL  CBC  TYPE AND SCREEN  PREPARE RBC (CROSSMATCH)   Imaging Review No results found.   EKG Interpretation None      MDM   Final diagnoses:  GI bleeding  Cirrhosis of liver  Esophageal varices in alcoholic cirrhosis  EtOH dependence  Hepatitis    Recurrent GI bleeding, likely from varices.  IV protonix, octreotide, ceftriaxone.  Plan labs, IV fluids. IV zofran for symptom control.    Hg 6.9.  Discussed need for transfusion, especially with persistent mild hypotension.  Continued to mentate well.  No further episodes of emesis.    Discussed with Dr. Matthias HughsBuccini (GI) who will evaluate in AM.  Discussed with internal medicine teaching service for admission to stepdown unit for close monitoring.   Candyce ChurnJohn David Wayland Baik III, MD 03/06/13 [phone]

## 2013-03-06 ENCOUNTER — Encounter (HOSPITAL_COMMUNITY): Payer: Self-pay | Admitting: *Deleted

## 2013-03-06 ENCOUNTER — Encounter (HOSPITAL_COMMUNITY)
Admission: EM | Disposition: A | Discharge status: Home / Self Care | Payer: Self-pay | Source: Home / Self Care | Attending: Internal Medicine

## 2013-03-06 DIAGNOSIS — F1010 Alcohol abuse, uncomplicated: Secondary | ICD-10-CM

## 2013-03-06 DIAGNOSIS — I8511 Secondary esophageal varices with bleeding: Secondary | ICD-10-CM

## 2013-03-06 HISTORY — PX: ESOPHAGOGASTRODUODENOSCOPY: SHX5428

## 2013-03-06 LAB — CBC
HCT: 27.5 % — ABNORMAL LOW (ref 36.0–46.0)
HEMATOCRIT: 21.3 % — AB (ref 36.0–46.0)
HEMATOCRIT: 26.8 % — AB (ref 36.0–46.0)
HEMATOCRIT: 25.8 % — AB (ref 36.0–46.0)
HEMOGLOBIN: 7 g/dL — AB (ref 12.0–15.0)
HEMOGLOBIN: 8.9 g/dL — AB (ref 12.0–15.0)
Hemoglobin: 8.9 g/dL — ABNORMAL LOW (ref 12.0–15.0)
Hemoglobin: 8.6 g/dL — ABNORMAL LOW (ref 12.0–15.0)
MCH: 31 pg (ref 26.0–34.0)
MCH: 31.8 pg (ref 26.0–34.0)
MCH: 31 pg (ref 26.0–34.0)
MCH: 31.9 pg (ref 26.0–34.0)
MCHC: 32.9 g/dL (ref 30.0–36.0)
MCHC: 33.2 g/dL (ref 30.0–36.0)
MCHC: 32.4 g/dL (ref 30.0–36.0)
MCHC: 33.3 g/dL (ref 30.0–36.0)
MCV: 94.2 fL (ref 78.0–100.0)
MCV: 95.7 fL (ref 78.0–100.0)
MCV: 95.8 fL (ref 78.0–100.0)
MCV: 95.6 fL (ref 78.0–100.0)
Platelets: 128 10*3/uL — ABNORMAL LOW (ref 150–400)
Platelets: 125 10*3/uL — ABNORMAL LOW (ref 150–400)
Platelets: 140 10*3/uL — ABNORMAL LOW (ref 150–400)
Platelets: 141 10*3/uL — ABNORMAL LOW (ref 150–400)
RBC: 2.26 MIL/uL — AB (ref 3.87–5.11)
RBC: 2.8 MIL/uL — AB (ref 3.87–5.11)
RBC: 2.87 MIL/uL — AB (ref 3.87–5.11)
RBC: 2.7 MIL/uL — ABNORMAL LOW (ref 3.87–5.11)
RDW: 20 % — AB (ref 11.5–15.5)
RDW: 19.2 % — ABNORMAL HIGH (ref 11.5–15.5)
RDW: 19.9 % — ABNORMAL HIGH (ref 11.5–15.5)
RDW: 19.8 % — AB (ref 11.5–15.5)
WBC: 4.8 10*3/uL (ref 4.0–10.5)
WBC: 4.2 10*3/uL (ref 4.0–10.5)
WBC: 4.6 10*3/uL (ref 4.0–10.5)
WBC: 4.4 10*3/uL (ref 4.0–10.5)

## 2013-03-06 LAB — URINALYSIS, ROUTINE W REFLEX MICROSCOPIC
GLUCOSE, UA: NEGATIVE mg/dL
Hgb urine dipstick: NEGATIVE
Ketones, ur: NEGATIVE mg/dL
Nitrite: NEGATIVE
Protein, ur: NEGATIVE mg/dL
Specific Gravity, Urine: 1.025 (ref 1.005–1.030)
UROBILINOGEN UA: 0.2 mg/dL (ref 0.0–1.0)
pH: 5.5 (ref 5.0–8.0)

## 2013-03-06 LAB — BASIC METABOLIC PANEL
BUN: 17 mg/dL (ref 6–23)
CALCIUM: 7.2 mg/dL — AB (ref 8.4–10.5)
CO2: 19 mEq/L (ref 19–32)
Chloride: 111 mEq/L (ref 96–112)
Creatinine, Ser: 0.77 mg/dL (ref 0.50–1.10)
Glucose, Bld: 75 mg/dL (ref 70–99)
Potassium: 4.1 mEq/L (ref 3.7–5.3)
Sodium: 142 mEq/L (ref 137–147)

## 2013-03-06 LAB — MRSA PCR SCREENING: MRSA BY PCR: NEGATIVE

## 2013-03-06 LAB — URINE MICROSCOPIC-ADD ON

## 2013-03-06 LAB — ETHANOL

## 2013-03-06 LAB — TROPONIN I

## 2013-03-06 LAB — GLUCOSE, CAPILLARY: GLUCOSE-CAPILLARY: 71 mg/dL (ref 70–99)

## 2013-03-06 LAB — PREPARE RBC (CROSSMATCH)

## 2013-03-06 SURGERY — EGD (ESOPHAGOGASTRODUODENOSCOPY)
Anesthesia: Moderate Sedation

## 2013-03-06 MED ORDER — CIPROFLOXACIN IN D5W 400 MG/200ML IV SOLN
400.0000 mg | Freq: Two times a day (BID) | INTRAVENOUS | Status: DC
  Administered 2013-03-06 – 2013-03-09 (×6): 400 mg via INTRAVENOUS
  Filled 2013-03-06 (×8): qty 200

## 2013-03-06 MED ORDER — BUTAMBEN-TETRACAINE-BENZOCAINE 2-2-14 % EX AERO
INHALATION_SPRAY | CUTANEOUS | Status: DC | PRN
  Administered 2013-03-06: 2 via TOPICAL

## 2013-03-06 MED ORDER — DIPHENHYDRAMINE HCL 50 MG/ML IJ SOLN
INTRAMUSCULAR | Status: AC
  Filled 2013-03-06: qty 1

## 2013-03-06 MED ORDER — FENTANYL CITRATE 0.05 MG/ML IJ SOLN
INTRAMUSCULAR | Status: DC | PRN
  Administered 2013-03-06: 25 ug via INTRAVENOUS

## 2013-03-06 MED ORDER — MIDAZOLAM HCL 10 MG/2ML IJ SOLN
INTRAMUSCULAR | Status: DC | PRN
  Administered 2013-03-06: 1 mg via INTRAVENOUS
  Administered 2013-03-06: 2 mg via INTRAVENOUS
  Administered 2013-03-06 (×3): 1 mg via INTRAVENOUS

## 2013-03-06 MED ORDER — PANTOPRAZOLE SODIUM 40 MG IV SOLR
40.0000 mg | Freq: Two times a day (BID) | INTRAVENOUS | Status: DC
  Administered 2013-03-06 – 2013-03-09 (×7): 40 mg via INTRAVENOUS
  Filled 2013-03-06 (×9): qty 40

## 2013-03-06 MED ORDER — MIDAZOLAM HCL 5 MG/ML IJ SOLN
INTRAMUSCULAR | Status: AC
  Filled 2013-03-06: qty 2

## 2013-03-06 MED ORDER — DIPHENHYDRAMINE HCL 50 MG/ML IJ SOLN
INTRAMUSCULAR | Status: DC | PRN
  Administered 2013-03-06 (×2): 25 mg via INTRAVENOUS

## 2013-03-06 MED ORDER — FENTANYL CITRATE 0.05 MG/ML IJ SOLN
INTRAMUSCULAR | Status: AC
  Filled 2013-03-06: qty 2

## 2013-03-06 MED ORDER — SODIUM CHLORIDE 0.9 % IV SOLN: INTRAVENOUS | Status: DC

## 2013-03-06 NOTE — Care Management Note (Signed)
    Page 1 of 1   03/06/2013     7:40:44 AM   CARE MANAGEMENT NOTE 03/06/2013  Patient:  [name],[name]   Account Number:  [id number]  Date Initiated:  03/06/2013  Documentation initiated by:  Junius CreamerWELL,DEBBIE  Subjective/Objective Assessment:   adm w gi bleed     Action/Plan:   lives w fam, pcp dr Lowanda Fosterbrittany hunt   Anticipated DC Date:     Anticipated DC Plan:           Choice offered to / List presented to:             Status of service:   Medicare Important Message given?   (If response is "NO", the following Medicare IM given date fields will be blank) Date Medicare IM given:   Date Additional Medicare IM given:    Discharge Disposition:    Per UR Regulation:  Reviewed for med. necessity/level of care/duration of stay  If discussed at Long Length of Stay Meetings, dates discussed:    Comments:

## 2013-03-06 NOTE — Brief Op Note (Signed)
Bleeding stigmata on esophageal varices with nipple sign noted and prior to banding active bleeding from this area occurred with vigorous bleeding that was stopped with esophageal variceal band ligation. See endopro note for details.

## 2013-03-06 NOTE — Progress Notes (Signed)
Night Float Interim Progress Note  Went to evaluate patient this morning given continued hypotension despite fluid's. Received 1L in ED, given another 500cc bolus earlier this evening.  Upon evaluation of patient at this time, BP was being taken on lower arm, changed to upper arm at site of extra IV. Repeat BP 80/30's. Patient comfortable, no more episodes of hematemesis since admission.   Vitals reviewed. General: resting in bed, NAD Cardiac: RRR Abd: soft, nontender, BS present Ext: moving all 4 extremities Neuro: alert and oriented X3  [name] is a 43 year old female with PMH of alcoholic cirrhosis and hepatitis with prior history of esophageal varices bleeding s/p banding admitted for recurrent hematemesis.  Hb on admission down to 6.9 at time of admission. Given 1 unit PRBC in ED. Post transfusion Hb 7.0. Patient noted to have low BP in the past (SBP 100's), however currently remains 80/30's.   -give another 500cc NS bolus -given continued hypotension and GIB with minimal rise in Hb s/p 1 unit PRBC, will transfuse 1 more unit PRBC -continue to monitor  Signed: Darden PalmerSamaya Demaya Hardge, MD PGY-2, Internal Medicine Resident Pager: [phone]  03/06/2013,2:47 AM

## 2013-03-06 NOTE — H&P (View-Only) (Signed)
Per conversation w/ RN this a.m., no further bleeding  BP remains low but this may be nl for her--she runs low as outpt and she is asymptomatic  Hgb unchanged at 7 after 1 u prc's--to receive another.  We will be seeing pt later this a.m.    Florencia Reasonsobert V. Gus Littler, M.D. [phone]

## 2013-03-06 NOTE — Consult Note (Signed)
Referring Provider: Dr. Kem Kays Primary Care Physician:  Tanna Furry, PA-C Primary Gastroenterologist:  Dr. Bosie Clos  Reason for Consultation:  Hematemesis  HPI: [name] is a 43 y.o. female with cirrhosis and history of alcohol abuse and PSC who was in the hospital last month with a GI bleed and alcohol intoxication. Variceal bleed last September and banded at that time. Reports acute onset of hematemesis yesterday that she described as 5 cups in volume. Denies melena or hematochezia. Hypotensive on admit with BP in the 90's systolic. Hgb 6.9 (7.5 on 02/19/13). No further hematemesis since admit. Denies any alcohol since mid-February. S/P 2 U PRBCs and Hgb 8.9 this morning. Denies any abdominal pain. Reports daily aspirin for back pain and denies any other NSAIDs. Reports dark urine today. She had been going to alcohol counseling with a desire to be on a liver transplant list in the future but had yet to be evaluated formally by a transplant center due to her alcohol abuse.     Past Medical History  Diagnosis Date  . Sclerosing cholangitis   . Panic attacks   . Anxiety   . Alcohol abuse   . Ascites   . Cirrhosis of liver   . EtOH dependence 08/31/2012  . Family history of anesthesia complication     " my son had problems "    Past Surgical History  Procedure Laterality Date  . None    . Ercp    . Esophagogastroduodenoscopy N/A 09/10/2012    Procedure: ESOPHAGOGASTRODUODENOSCOPY (EGD);  Surgeon: Florencia Reasons, MD;  Location: [facility] ENDOSCOPY;  Service: Endoscopy;  Laterality: N/A;  . Esophagogastroduodenoscopy N/A 11/04/2012    Procedure: ESOPHAGOGASTRODUODENOSCOPY (EGD);  Surgeon: Barrie Folk, MD;  Location: [facility] ENDOSCOPY;  Service: Endoscopy;  Laterality: N/A;    Prior to Admission medications   Medication Sig Start Date End Date Taking? Authorizing Provider  aspirin EC 325 MG tablet Take 325 mg by mouth every 6 (six) hours as needed for mild pain.   Yes Historical Provider, MD   buPROPion (WELLBUTRIN XL) 150 MG 24 hr tablet Take 150 mg by mouth daily.   Yes Historical Provider, MD  folic acid (FOLVITE) 800 MCG tablet Take 800 mcg by mouth daily.   Yes Historical Provider, MD  furosemide (LASIX) 20 MG tablet Take 1 tablet (20 mg total) by mouth 2 (two) times daily. 11/07/12  Yes Rocco Serene, MD  lactulose (CHRONULAC) 10 GM/15ML solution Take 20 g by mouth 2 (two) times daily.   Yes Historical Provider, MD  Multiple Vitamins-Minerals (WOMENS ONE DAILY) TABS Take 1 tablet by mouth daily.   Yes Historical Provider, MD  pantoprazole (PROTONIX) 40 MG tablet Take 40 mg by mouth 2 (two) times daily.   Yes Historical Provider, MD  potassium chloride 20 MEQ/15ML (10%) solution Take 20 mEq by mouth 2 (two) times daily.   Yes Historical Provider, MD  propranolol (INDERAL) 20 MG tablet Take 20 mg by mouth daily.   Yes Historical Provider, MD  rifaximin (XIFAXAN) 550 MG TABS tablet Take 550 mg by mouth 2 (two) times daily.   Yes Historical Provider, MD  spironolactone (ALDACTONE) 100 MG tablet Take 100 mg by mouth daily.   Yes Historical Provider, MD  thiamine (VITAMIN B-1) 100 MG tablet Take 100 mg by mouth daily.   Yes Historical Provider, MD  traMADol (ULTRAM) 50 MG tablet Take 1 tablet (50 mg total) by mouth every 6 (six) hours as needed for moderate pain. 11/07/12  Yes Lequita Halt  Aundria Rudogers, MD  ursodiol (ACTIGALL) 300 MG capsule Take 300 mg by mouth 2 (two) times daily.   Yes Historical Provider, MD    Scheduled Meds: . buPROPion  150 mg Oral Daily  . cefTRIAXone (ROCEPHIN)  IV  1 g Intravenous Q24H  . folic acid  1 mg Oral Daily  . lactulose  20 g Oral BID  . multivitamin with minerals  1 tablet Oral Daily  . pantoprazole (PROTONIX) IV  40 mg Intravenous Q12H  . rifaximin  550 mg Oral BID  . sodium chloride  3 mL Intravenous Q12H  . thiamine  100 mg Oral Daily  . ursodiol  300 mg Oral BID   Continuous Infusions: . sodium chloride 150 mL/hr at 03/06/13 1115  . octreotide  (SANDOSTATIN) infusion 50 mcg/hr (03/06/13 0800)   PRN Meds:.LORazepam, LORazepam, ondansetron (ZOFRAN) IV, ondansetron, traMADol  Allergies as of 03/05/2013  . (No Known Allergies)    Family History  Problem Relation Age of Onset  . CAD Father     History   Social History  . Marital Status: Single    Spouse Name: N/A    Number of Children: N/A  . Years of Education: N/A   Occupational History  . Not on file.   Social History Main Topics  . Smoking status: Current Every Day Smoker -- 1.00 packs/day for 20 years    Types: Cigarettes  . Smokeless tobacco: Never Used  . Alcohol Use: Yes     Comment: Pt drinks vodka/liquor every weekend, denies daily drinking...states she quit a month ago....  . Drug Use: Yes    Special: Marijuana  . Sexual Activity: Yes    Birth Control/ Protection: IUD   Other Topics Concern  . Not on file   Social History Narrative  . No narrative on file    Review of Systems: All negative from GI standpoint except as stated above in HPI.  Physical Exam: Vital signs: Filed Vitals:   03/06/13 1208  BP: 90/34  Pulse: 65  Temp: 98.4  Resp: 19     General:   Lethargic, awake, no acute distress HEENT: anicteric Lungs:  Clear throughout to auscultation.   No wheezes, crackles, or rhonchi. No acute distress. Heart:  Regular rate and rhythm; no murmurs, clicks, rubs,  or gallops. Abdomen: epigastric tenderness with guarding, soft, nondistended, +BS  Rectal:  Deferred  GI:  Lab Results:  Recent Labs  03/06/13 0120 03/06/13 0620 03/06/13 1045  WBC 4.8 4.2 4.6  HGB 7.0* 8.9* 8.9*  HCT 21.3* 26.8* 27.5*  PLT 128* 125* 140*   BMET  Recent Labs  03/05/13 1645 03/06/13 0145  NA 143 142  K 3.7 4.1  CL 109 111  CO2 24 19  GLUCOSE 99 75  BUN 16 17  CREATININE 0.89 0.77  CALCIUM 7.8* 7.2*   LFT  Recent Labs  03/05/13 1645  PROT 6.0  ALBUMIN 2.1*  AST 52*  ALT 17  ALKPHOS 284*  BILITOT 1.3*   PT/INR  Recent Labs   03/05/13 1645  LABPROT 21.9*  INR 1.98*     Studies/Results: No results found.  Impression/Plan: 43 yo with cirrhosis and history of alcohol abuse and PSC presenting with hematemesis concerning for a variceal bleed. Bedside EGD. Continue Octreotide drip. Volume resuscitate. IV PPI. Supportive care.    LOS: 1 day   Bernis Stecher C.  03/06/2013, 12:31 PM

## 2013-03-06 NOTE — H&P (Signed)
INTERNAL MEDICINE TEACHING SERVICE Attending Admission Note  Date: 03/06/2013  Patient name: [name]  Medical record number: [id number]  Date of birth: 1970-07-08    I have seen and evaluated [name] and discussed their care with the Residency Team.  43 yr old woman with a pmhx of alcohol abuse, esophageal varices w/ cirrhosis, presented with hematemesis. She admitted to five cups of hematemesis. She denies LOC or CP or SOB. She has a known hx of UGI bleed and admission for this earlier in 02/2013. She was noted to have a BP of 91/36 in the ED. Admission H/H noted to be 6.9/21.6 compared to a discharge value of 7.5/23.2 on 02/19/13. She was volume resuscitated in the ED and transfused 2 U PRBCs. This morning H/H is 8.9/27.5/ She denies any further hematemesis. She is AAOx3 in the room on my exam. Abdomen is nontender but with noted HSM. At this time, continue octreotide gtt and protonix gtt. GI was made aware of admission overnight. Her BP runs low at baseline, but regardless, given presentation, careful monitor and watch for appropriate H/H response to pRBCS.   Jonah BlueAlejandro Marty Uy, DO, FACP Faculty [facility] 03/06/2013, 12:07 PM

## 2013-03-06 NOTE — Progress Notes (Signed)
Systolic BP remains in 70's. Pt asymptomatic. Dr Marilu FavreQuershi paged. Order received to check manual BP and increase maintenance IV fluids to 150 ml/hr.

## 2013-03-06 NOTE — Progress Notes (Signed)
Per conversation w/ RN this a.m., no further bleeding  BP remains low but this may be nl for her--she runs low as outpt and she is asymptomatic  Hgb unchanged at 7 after 1 u prc's--to receive another.  We will be seeing pt later this a.m.    Thaila Bottoms V. Carliyah Cotterman, M.D. [phone]  

## 2013-03-06 NOTE — Op Note (Signed)
[facility] [address] [place], [id number]   ENDOSCOPY PROCEDURE REPORT  PATIENT: [name], [name]  MR#: [id number] BIRTHDATE: 12-07-1970 , 42  yrs. old GENDER: Female  ENDOSCOPIST: Charlott RakesVincent Tristin Gladman, MD REFERRED BY:  PROCEDURE DATE:  03/06/2013 PROCEDURE:   EGD w/ band ligation of varices ASA CLASS:   Class IV INDICATIONS:Hematemesis. MEDICATIONS: Fentanyl 50 mcg IV, Versed 6 mg IV, and Benadryl 25 mg IV  TOPICAL ANESTHETIC:  DESCRIPTION OF PROCEDURE:   After the risks benefits and alternatives of the procedure were thoroughly explained, informed consent was obtained.  The Pentax Gastroscope [id number]  endoscope was introduced through the mouth and advanced to the second portion of the duodenum , limited by Without limitations.   The instrument was slowly withdrawn as the mucosa was fully examined.     FINDINGS: The endoscope was inserted into the oropharynx and esophagus was intubated.  The gastroesophageal junction was noted to be 36 cm from the incisors. Esophageal varices noted from the mid-esophagus extending to the GEJ. In the distal esophagus was a focal raised area on a varix consistent with a nipple sign that was not actively bleeding on initial inspection. Endoscope was advanced into the stomach where clear fluid was seen and mild erythema noted in the antrum. The endoscope was advanced to the duodenal bulb and second portion of duodenum which were unremarkable and clear bilious fluid was seen.  The endoscope was withdrawn back into the stomach and revealed a normal proximal stomach. The endoscope was withdrawn and a bander device was attached to the tip of the endoscope in the usual fashion. Upon reinsertion of the endoscope the nipple sign was oozing red blood and then began spurting red blood. Two bands were applied to this site with good hemostasis. There was a flat red spot about 5 cm proximal to this area and attempts to band this  area were unsuccessful likely due to scarring. The area became irritated from attempted banding with minimal bleeding that stopped spontaneously. Several columns of medium-sized esophageal varices extended from 25 cm - 36 cm from the incisors.  COMPLICATIONS: None  ENDOSCOPIC IMPRESSION:     Esophageal variceal bleed - s/p band ligation Gastritis  RECOMMENDATIONS: Keep strict NPO for now; Continue Octreotide; Continue IV PPI; Supportive care; IV Abx   REPEAT EXAM: N/A  _______________________________ Charlott RakesVincent Verner Kopischke, MD eSigned:  Charlott RakesVincent Rami Waddle, MD 03/06/2013 1:41 PM    CC:  PATIENT NAME:  [name], [name] MR#: [id number]

## 2013-03-06 NOTE — Progress Notes (Signed)
BP 86/38. Dr Virgina OrganQureshi paged. Order received for 500ml Bolus of NS.

## 2013-03-06 NOTE — Progress Notes (Signed)
Subjective: Patient seen and examined at the bedside this morning. She is alert, oriented, talkative, and active. BP 85/40 on the monitor. HR 66. She reports no further hematemesis since the episode prior to admission. At that time, she reports throwing up about 3 "water cups" full of blood. Denies frank hematochezia. She tells me her blood pressure is usually low. Denies dizziness and has been able to walk about the room to use the commode. She tells me she has some right hip pain after falling on her tailbone last Thursday. She slipped on a linoleum floor. She has been able to walk normally since then, but reports pain and a limp. I told her I would be happy to image this after we have a solid plan for her GI bleeding. She tells me she has not used alcohol since November.  Objective: Vital signs in last 24 hours: Filed Vitals:   03/06/13 0526 03/06/13 0600 03/06/13 0700 03/06/13 0800  BP: 82/58 73/27 74/33  84/37  Pulse: 70 75 71 66  Temp:    98.4 F (36.9 C)  TempSrc:    Oral  Resp: 19 16 21 19   Height:      Weight:      SpO2: 97% 98% 97% 98%   Weight change:   Intake/Output Summary (Last 24 hours) at 03/06/13 1024 Last data filed at 03/06/13 0900  Gross per 24 hour  Intake 2437.5 ml  Output    550 ml  Net 1887.5 ml   Physical Exam:  General: resting in bed in NAD, alert and talkative, jaundiced skin HEENT: PERRL, EOMI, oropharynx clear and moist  Cardiac: RRR, no rubs, murmurs or gallops  Pulm: clear to auscultation bilaterally, moving normal volumes of air  Abd: soft, nontender, BS present, +distention, +hepatomegaly  Ext: warm and well perfused, no pedal edema  Neuro: alert and oriented X3, cranial nerves II-XII grossly intact, 5/5 MMS  Lab Results: Basic Metabolic Panel:  Recent Labs Lab 03/05/13 1645 03/06/13 0145  NA 143 142  K 3.7 4.1  CL 109 111  CO2 24 19  GLUCOSE 99 75  BUN 16 17  CREATININE 0.89 0.77  CALCIUM 7.8* 7.2*   Liver Function  Tests:  Recent Labs Lab 03/05/13 1645  AST 52*  ALT 17  ALKPHOS 284*  BILITOT 1.3*  PROT 6.0  ALBUMIN 2.1*   No results found for this basename: LIPASE, AMYLASE,  in the last 168 hours  Recent Labs Lab 03/05/13 1612  AMMONIA 35   CBC:  Recent Labs Lab 03/05/13 1645 03/06/13 0120 03/06/13 0620  WBC 5.2 4.8 4.2  NEUTROABS 2.8  --   --   HGB 6.9* 7.0* 8.9*  HCT 21.6* 21.3* 26.8*  MCV 98.2 94.2 95.7  PLT 158 128* 125*   Cardiac Enzymes:  Recent Labs Lab 03/05/13 2115 03/06/13 0120 03/06/13 0900  TROPONINI <0.30 <0.30 <0.30   BNP: No results found for this basename: PROBNP,  in the last 168 hours D-Dimer: No results found for this basename: DDIMER,  in the last 168 hours CBG:  Recent Labs Lab 03/06/13 0826  GLUCAP 71   Hemoglobin A1C: No results found for this basename: HGBA1C,  in the last 168 hours Fasting Lipid Panel: No results found for this basename: CHOL, HDL, LDLCALC, TRIG, CHOLHDL, LDLDIRECT,  in the last 168 hours Thyroid Function Tests: No results found for this basename: TSH, T4TOTAL, FREET4, T3FREE, THYROIDAB,  in the last 168 hours Coagulation:  Recent Labs Lab 03/05/13 1645  LABPROT 21.9*  INR 1.98*   Anemia Panel: No results found for this basename: VITAMINB12, FOLATE, FERRITIN, TIBC, IRON, RETICCTPCT,  in the last 168 hours Urine Drug Screen: Drugs of Abuse     Component Value Date/Time   LABOPIA NONE DETECTED 01/31/2013 1030   COCAINSCRNUR NONE DETECTED 01/31/2013 1030   LABBENZ NONE DETECTED 01/31/2013 1030   AMPHETMU NONE DETECTED 01/31/2013 1030   THCU NONE DETECTED 01/31/2013 1030   LABBARB NONE DETECTED 01/31/2013 1030    Alcohol Level:  Recent Labs Lab 03/06/13 0900  ETH <11   Urinalysis:  Recent Labs Lab 03/06/13 0824  COLORURINE AMBER*  LABSPEC 1.025  PHURINE 5.5  GLUCOSEU NEGATIVE  HGBUR NEGATIVE  BILIRUBINUR SMALL*  KETONESUR NEGATIVE  PROTEINUR NEGATIVE  UROBILINOGEN 0.2  NITRITE NEGATIVE   LEUKOCYTESUR MODERATE*     Micro Results: Recent Results (from the past 240 hour(s))  MRSA PCR SCREENING     Status: None   Collection Time    03/05/13 10:40 PM      Result Value Ref Range Status   MRSA by PCR NEGATIVE  NEGATIVE Final   Comment:            The GeneXpert MRSA Assay (FDA     approved for NASAL specimens     only), is one component of a     comprehensive MRSA colonization     surveillance program. It is not     intended to diagnose MRSA     infection nor to guide or     monitor treatment for     MRSA infections.   Studies/Results: No results found. Medications: I have reviewed the patient's current medications. Scheduled Meds: . buPROPion  150 mg Oral Daily  . cefTRIAXone (ROCEPHIN)  IV  1 g Intravenous Q24H  . folic acid  1 mg Oral Daily  . lactulose  20 g Oral BID  . multivitamin with minerals  1 tablet Oral Daily  . pantoprazole (PROTONIX) IV  40 mg Intravenous Q12H  . rifaximin  550 mg Oral BID  . sodium chloride  3 mL Intravenous Q12H  . thiamine  100 mg Oral Daily  . ursodiol  300 mg Oral BID   Continuous Infusions: . sodium chloride 50 mL/hr at 03/05/13 2333  . octreotide (SANDOSTATIN) infusion 50 mcg/hr (03/06/13 0800)   PRN Meds:.LORazepam, LORazepam, ondansetron (ZOFRAN) IV, ondansetron, traMADol  Assessment/Plan: 43 year old woman with a PMH of EtOH abuse, cirrhosis with esophageal varices who presents with complaint of hematemesis.   #UGIB - No hematemesis since admission. Hgb 8.9 s/p 2U PRBCs. Trend below. Likely due to bleeding esophageal varices given patient's history. FOBT positive as expected. Troponins trended and negative x3. - PRBCs to keep Hgb > 7.0  - Monitor CBC q6h - Continue Octreotide gtt, IV Protonix and IV Rocephin  - Zofran prn nausea - Tramadol q6h prn pain - GI consulted and will see her this morning, Dr. Matthias Hughs - NPO for possible endoscopy  Hemoglobin  Date Value Ref Range Status  03/06/2013 8.9* 12.0 - 15.0 g/dL  Final     REPEATED TO VERIFY     POST TRANSFUSION SPECIMEN  03/06/2013 7.0* 12.0 - 15.0 g/dL Final  01/08/1094 6.9* 04.5 - 15.0 g/dL Final     REPEATED TO VERIFY     CRITICAL RESULT CALLED TO, READ BACK BY AND VERIFIED WITH:     A MCKEOWN RN 1710 03/05/13 A BROWNING  02/19/2013 7.5* 12.0 - 15.0 g/dL Final  04/11/8117  8.0* 12.0 - 15.0 g/dL Final    #Hypotension - 70-100s/20-40s since admission. ~100s/40-50s at baseline based on prior notes. She is asymptomatic and does not appear to be in hypovolemic shock. HR 60s (takes propranolol at home, but being held here). She is not actively bleeding. S/p ~2L NS bolus plus 2U PRBCs, now on continuous IVF. - Continue IVF NS @150cc /hr  - Continue to monitor and fluid resuscitate as needed  #History of alcohol abuse - Last admission patiet was admitted with an BAC = 0.21 and was put on a CIWA protocol. She denies using EtOH since then. Ethanol level <11 here. - CIWA protocol - Folate, MV, thiamine - We discussed her drinking habits and recommended cessation  #Right hip pain - Patient reports fall on tailbone/right hip last Thursday. No issues with ambulation. Strength and sensation preserved. - Will XR after plan has been established for UGIB  #Cirrhosis - Ammonia 35 (wnl). She has evidence of liver synthetic deficits including PT/INR 21.9/1.98, thrombocytopenia (128). - Continue lactulose, rifaximin, spironolactone, ursodiol  #Depression - Continue Wellbutrin  #VTE - SCDs   Dispo: Disposition is deferred at this time, awaiting improvement of current medical problems.  Anticipated discharge in approximately 2-4 day(s).   The patient does have a current PCP ([place], PA-C) and does not need an [facility] hospital follow-up appointment after discharge.  The patient does not have transportation limitations that hinder transportation to clinic appointments.  .Services Needed at time of discharge: Y = Yes, Blank = No PT:   OT:   RN:   Equipment:    Other:     LOS: 1 day   [name] Barrack, MD 03/06/2013, 10:24 AM

## 2013-03-06 NOTE — H&P (Signed)
Date: 03/06/2013               Patient Name:  [name] MRN: [id number]  DOB: 28-Aug-1970 Age / Sex: 43 y.o., female   PCP: Tanna Furry, PA-C         Medical Service: Internal Medicine Teaching Service         Attending Physician: Dr. Jonah Blue, DO    First Contact: Dr. Vivi Barrack Pager: [phone]  Second Contact: Dr. Sara Chu Pager: [phone]       After Hours (After 5p/  First Contact Pager: [phone]  weekends / holidays): Second Contact Pager: [phone]   Chief Complaint: Hematemesis  History of Present Illness: [name] is a 43 year old woman with a PMH of EtOH abuse, cirrhosis with esophageal varices who presents with complaint of hematemesis.  She was in her usual state of health at home today.  She says she ate and drank normally, took all of her medications as prescribed and denies recent EtOH use.  She felt as if she had to have a BM.  When she got to the bathroom door the room started spinning and she felt lightheaded.  She was able to make it to the commode and began vomiting up bright red blood.  When asked to quantify the amount she says it would fill about 5 cups.  She denies chest pain or LOC.  She was able to make it to the phone and called 911.   Of note, she was recently hospitalized for bleeding esophageal varices  02/15-02/17/2015.    In the ED:  T98.64F, RR18, SpO2 100% HR 71, BP 91/36; she received a 1L NSS bolus, octreotide, protonix, zofran and rocephin.  Meds: Current Facility-Administered Medications  Medication Dose Route Frequency Provider Last Rate Last Dose  . 0.9 %  sodium chloride infusion   Intravenous Continuous Darden Palmer, MD 50 mL/hr at 03/05/13 2333    . buPROPion (WELLBUTRIN XL) 24 hr tablet 150 mg  150 mg Oral Daily Darden Palmer, MD      . cefTRIAXone (ROCEPHIN) 1 g in dextrose 5 % 50 mL IVPB  1 g Intravenous Q24H Darden Palmer, MD      . folic acid (FOLVITE) tablet 1 mg  1 mg Oral Daily Jonah Blue, DO      . lactulose  (CHRONULAC) 10 GM/15ML solution 20 g  20 g Oral BID Darden Palmer, MD   20 g at 03/05/13 2322  . LORazepam (ATIVAN) tablet 1 mg  1 mg Oral Q6H PRN Darden Palmer, MD       Or  . LORazepam (ATIVAN) injection 1 mg  1 mg Intravenous Q6H PRN Darden Palmer, MD      . multivitamin with minerals tablet 1 tablet  1 tablet Oral Daily Jonah Blue, DO      . octreotide (SANDOSTATIN) 2 mcg/mL in sodium chloride 0.9 % 250 mL infusion  50 mcg/hr Intravenous Continuous Candyce Churn III, MD 25 mL/hr at 03/05/13 1650 50 mcg/hr at 03/05/13 1650  . ondansetron (ZOFRAN) tablet 4 mg  4 mg Oral Q6H PRN Darden Palmer, MD       Or  . ondansetron (ZOFRAN) injection 4 mg  4 mg Intravenous Q6H PRN Darden Palmer, MD      . pantoprazole (PROTONIX) EC tablet 40 mg  40 mg Oral BID Darden Palmer, MD   40 mg at 03/05/13 2321  . rifaximin (XIFAXAN) tablet 550 mg  550 mg Oral BID Darden Palmer, MD  550 mg at 03/05/13 2321  . sodium chloride 0.9 % injection 3 mL  3 mL Intravenous Q12H Darden Palmer, MD   3 mL at 03/05/13 2323  . thiamine (VITAMIN B-1) tablet 100 mg  100 mg Oral Daily Darden Palmer, MD      . traMADol [name]) tablet 50 mg  50 mg Oral Q6H PRN Darden Palmer, MD   50 mg at 03/05/13 2328  . ursodiol (ACTIGALL) capsule 300 mg  300 mg Oral BID Darden Palmer, MD   300 mg at 03/05/13 2321    Allergies: Allergies as of 03/05/2013  . (No Known Allergies)   Past Medical History  Diagnosis Date  . Sclerosing cholangitis   . Panic attacks   . Anxiety   . Alcohol abuse   . Ascites   . Cirrhosis of liver   . EtOH dependence 08/31/2012  . Family history of anesthesia complication     " my son had problems "   Past Surgical History  Procedure Laterality Date  . None    . Ercp    . Esophagogastroduodenoscopy N/A 09/10/2012    Procedure: ESOPHAGOGASTRODUODENOSCOPY (EGD);  Surgeon: Florencia Reasons, MD;  Location: [facility] ENDOSCOPY;  Service: Endoscopy;  Laterality: N/A;  . Esophagogastroduodenoscopy N/A  11/04/2012    Procedure: ESOPHAGOGASTRODUODENOSCOPY (EGD);  Surgeon: Barrie Folk, MD;  Location: [facility] ENDOSCOPY;  Service: Endoscopy;  Laterality: N/A;   Family History  Problem Relation Age of Onset  . CAD Father    History   Social History  . Marital Status: Single    Spouse Name: N/A    Number of Children: N/A  . Years of Education: N/A   Occupational History  . Not on file.   Social History Main Topics  . Smoking status: Current Every Day Smoker -- 1.00 packs/day for 20 years    Types: Cigarettes  . Smokeless tobacco: Never Used  . Alcohol Use: Yes     Comment: Pt drinks vodka/liquor every weekend, denies daily drinking...states she quit a month ago....  . Drug Use: Yes    Special: Marijuana  . Sexual Activity: Yes    Birth Control/ Protection: IUD   Other Topics Concern  . Not on file   Social History Narrative  . No narrative on file    Review of Systems: Pertinent items noted in HPI General:  Denies increased weakness or fatigue, no unexplained weight loss Cardiopulmonary:  Denies chest pain or dyspnea GI:  Denies decreased po, diarrhea, blood in stool or dark stools GU:  Denies dysuria  Physical Exam: Blood pressure 81/42, pulse 72, temperature 98 F (36.7 C), temperature source Oral, resp. rate 12, SpO2 100.00%. General: resting in bed in NAD HEENT: PERRL, EOMI, oropharynx clear and moist Cardiac: RRR, no rubs, murmurs or gallops Pulm: clear to auscultation bilaterally, moving normal volumes of air Abd: soft, nontender, BS present, + distention, + hepatomegaly Ext: warm and well perfused, no pedal edema Neuro: alert and oriented X3, cranial nerves II-XII grossly intact, 5/5 MMS  Lab results: Basic Metabolic Panel:  Recent Labs  16/10/96 1645 03/06/13 0145  NA 143 142  K 3.7 4.1  CL 109 111  CO2 24 19  GLUCOSE 99 75  BUN 16 17  CREATININE 0.89 0.77  CALCIUM 7.8* 7.2*   Liver Function Tests:  Recent Labs  03/05/13 1645  AST 52*  ALT 17    ALKPHOS 284*  BILITOT 1.3*  PROT 6.0  ALBUMIN 2.1*   Recent Labs  03/05/13  1612  AMMONIA 35   CBC:  Recent Labs  03/05/13 1645 03/06/13 0120  WBC 5.2 4.8  NEUTROABS 2.8  --   HGB 6.9* 7.0*  HCT 21.6* 21.3*  MCV 98.2 94.2  PLT 158 128*   Cardiac Enzymes:  Recent Labs  03/05/13 2115 03/06/13 0120  TROPONINI <0.30 <0.30   Coagulation:  Recent Labs  03/05/13 1645  LABPROT 21.9*  INR 1.98*    Assessment & Plan by Problem: 43 year old woman with a PMH of EtOH abuse, cirrhosis with esophageal varices who presents with complaint of hematemesis.  UGIB:  Hematemesis consistent with UGIB.   Likely due to bleeding esophageal varices given patient's history. Hgb 6.9 at admission. 6.9 --> 7.0 s/p 1 unit PRBCs.  Patient also hypotensive 80-90s/30-50s.  Given an additional unit PRBCs and IV fluids. - Admit to SDU - PRBCs to keep Hgb > 7.0 - monitor CBC - continue Octreotide, Protonix and Rocephin - GI consulted and will see her in the AM   Cirrhosis:  Continue lactulose, rifaximin, spironolactone, ursodiol, propranolol.  Diet:  NPO  VTE:  SCDs  Dispo: Disposition is deferred at this time, awaiting improvement of current medical problems. Anticipated discharge in approximately 1-2 day(s).   The patient does have a current PCP (S[place] LeoneBrittany Hout, PA-C) and [facility] after discharge.  The patient does not know have transportation limitations that hinder transportation to clinic appointments.  Signed: Evelena PeatAlex Inanna Telford, DO 03/06/2013, 2:32 AM

## 2013-03-06 NOTE — Interval H&P Note (Signed)
History and Physical Interval Note:  03/06/2013 12:30 PM  [name]  has presented today for surgery, with the diagnosis of Cirrhosis,hematemesis  The various methods of treatment have been discussed with the patient and family. After consideration of risks, benefits and other options for treatment, the patient has consented to  Procedure(s) with comments: ESOPHAGOGASTRODUODENOSCOPY (EGD) (N/A) - Beside as a surgical intervention .  The patient's history has been reviewed, patient examined, no change in status, stable for surgery.  I have reviewed the patient's chart and labs.  Questions were answered to the patient's satisfaction.     Katey Barrie C.

## 2013-03-07 ENCOUNTER — Inpatient Hospital Stay (HOSPITAL_COMMUNITY): Payer: Medicaid Other

## 2013-03-07 ENCOUNTER — Encounter (HOSPITAL_COMMUNITY): Payer: Self-pay | Admitting: Gastroenterology

## 2013-03-07 DIAGNOSIS — F329 Major depressive disorder, single episode, unspecified: Secondary | ICD-10-CM

## 2013-03-07 DIAGNOSIS — I959 Hypotension, unspecified: Secondary | ICD-10-CM

## 2013-03-07 DIAGNOSIS — M25559 Pain in unspecified hip: Secondary | ICD-10-CM

## 2013-03-07 DIAGNOSIS — F3289 Other specified depressive episodes: Secondary | ICD-10-CM

## 2013-03-07 LAB — CBC
HCT: 26.9 % — ABNORMAL LOW (ref 36.0–46.0)
HCT: 24.5 % — ABNORMAL LOW (ref 36.0–46.0)
HCT: 25.8 % — ABNORMAL LOW (ref 36.0–46.0)
HCT: 25.2 % — ABNORMAL LOW (ref 36.0–46.0)
HEMOGLOBIN: 8.8 g/dL — AB (ref 12.0–15.0)
Hemoglobin: 7.9 g/dL — ABNORMAL LOW (ref 12.0–15.0)
Hemoglobin: 8.4 g/dL — ABNORMAL LOW (ref 12.0–15.0)
Hemoglobin: 8.5 g/dL — ABNORMAL LOW (ref 12.0–15.0)
MCH: 31.1 pg (ref 26.0–34.0)
MCH: 30.9 pg (ref 26.0–34.0)
MCH: 31.2 pg (ref 26.0–34.0)
MCH: 32.2 pg (ref 26.0–34.0)
MCHC: 32.7 g/dL (ref 30.0–36.0)
MCHC: 32.2 g/dL (ref 30.0–36.0)
MCHC: 32.6 g/dL (ref 30.0–36.0)
MCHC: 33.7 g/dL (ref 30.0–36.0)
MCV: 95.1 fL (ref 78.0–100.0)
MCV: 95.7 fL (ref 78.0–100.0)
MCV: 95.9 fL (ref 78.0–100.0)
MCV: 95.5 fL (ref 78.0–100.0)
PLATELETS: 136 10*3/uL — AB (ref 150–400)
Platelets: 146 10*3/uL — ABNORMAL LOW (ref 150–400)
Platelets: 153 10*3/uL (ref 150–400)
Platelets: 147 K/uL — ABNORMAL LOW (ref 150–400)
RBC: 2.83 MIL/uL — ABNORMAL LOW (ref 3.87–5.11)
RBC: 2.56 MIL/uL — ABNORMAL LOW (ref 3.87–5.11)
RBC: 2.69 MIL/uL — AB (ref 3.87–5.11)
RBC: 2.64 MIL/uL — ABNORMAL LOW (ref 3.87–5.11)
RDW: 19.6 % — AB (ref 11.5–15.5)
RDW: 19.8 % — AB (ref 11.5–15.5)
RDW: 19.7 % — AB (ref 11.5–15.5)
RDW: 19.3 % — ABNORMAL HIGH (ref 11.5–15.5)
WBC: 4.3 10*3/uL (ref 4.0–10.5)
WBC: 4.1 10*3/uL (ref 4.0–10.5)
WBC: 4.5 10*3/uL (ref 4.0–10.5)
WBC: 4.4 K/uL (ref 4.0–10.5)

## 2013-03-07 LAB — BASIC METABOLIC PANEL WITH GFR
BUN: 16 mg/dL (ref 6–23)
CO2: 18 meq/L — ABNORMAL LOW (ref 19–32)
Calcium: 8.1 mg/dL — ABNORMAL LOW (ref 8.4–10.5)
Chloride: 109 meq/L (ref 96–112)
Creatinine, Ser: 0.73 mg/dL (ref 0.50–1.10)
GFR calc Af Amer: >90 mL/min
GFR calc non Af Amer: >90 mL/min
Glucose, Bld: 111 mg/dL — ABNORMAL HIGH (ref 70–99)
Potassium: 3.8 meq/L (ref 3.7–5.3)
Sodium: 139 meq/L (ref 137–147)

## 2013-03-07 LAB — GLUCOSE, CAPILLARY
Glucose-Capillary: 117 mg/dL — ABNORMAL HIGH (ref 70–99)
Glucose-Capillary: 104 mg/dL — ABNORMAL HIGH (ref 70–99)
Glucose-Capillary: 109 mg/dL — ABNORMAL HIGH (ref 70–99)

## 2013-03-07 NOTE — Progress Notes (Addendum)
Subjective: Patient seen and examined at the bedside this morning. EGD with band ligation of varices was performed yesterday. I explained the findings to her. She tells me she had a sore throat this morning, but no other complaints. Denies hematemesis. She is able to walk to the bathroom without dizziness or lightheadedness. She tells me she is hoping for liver transplant and that her counselor has contacted [facility] about this. She is hungry and asks for a diet.  Objective: Vital signs in last 24 hours: Filed Vitals:   03/07/13 0200 03/07/13 0300 03/07/13 0352 03/07/13 0404  BP: 92/36 91/31 104/46   Pulse:    63  Temp:    97.3 F (36.3 C)  TempSrc:    Oral  Resp:      Height:      Weight:    174 lb 9.7 oz (79.2 kg)  SpO2:    100%   Weight change: 1 lb 15.8 oz (0.9 kg)  Intake/Output Summary (Last 24 hours) at 03/07/13 0721 Last data filed at 03/07/13 0600  Gross per 24 hour  Intake   2275 ml  Output   1000 ml  Net   1275 ml   Physical Exam:  General: resting in bed in NAD, alert and talkative, jaundiced skin HEENT: PERRL, EOMI, oropharynx clear and moist  Cardiac: RRR, no rubs, murmurs or gallops  Pulm: clear to auscultation bilaterally, moving normal volumes of air  Abd: soft, nontender, BS present, +distention, +hepatomegaly  Ext: warm and well perfused, no pedal edema  Neuro: alert and oriented X3, cranial nerves II-XII grossly intact, 5/5 MMS  Lab Results: Basic Metabolic Panel:  Recent Labs Lab 03/05/13 1645 03/06/13 0145  NA 143 142  K 3.7 4.1  CL 109 111  CO2 24 19  GLUCOSE 99 75  BUN 16 17  CREATININE 0.89 0.77  CALCIUM 7.8* 7.2*   Liver Function Tests:  Recent Labs Lab 03/05/13 1645  AST 52*  ALT 17  ALKPHOS 284*  BILITOT 1.3*  PROT 6.0  ALBUMIN 2.1*   No results found for this basename: LIPASE, AMYLASE,  in the last 168 hours  Recent Labs Lab 03/05/13 1612  AMMONIA 35   CBC:  Recent Labs Lab 03/05/13 1645  03/06/13 2300  03/07/13 0545  WBC 5.2  < > 4.3 4.1  NEUTROABS 2.8  --   --   --   HGB 6.9*  < > 8.8* 7.9*  HCT 21.6*  < > 26.9* 24.5*  MCV 98.2  < > 95.1 95.7  PLT 158  < > 146* 136*  < > = values in this interval not displayed. Cardiac Enzymes:  Recent Labs Lab 03/05/13 2115 03/06/13 0120 03/06/13 0900  TROPONINI <0.30 <0.30 <0.30   BNP: No results found for this basename: PROBNP,  in the last 168 hours D-Dimer: No results found for this basename: DDIMER,  in the last 168 hours CBG:  Recent Labs Lab 03/06/13 0826  GLUCAP 71   Hemoglobin A1C: No results found for this basename: HGBA1C,  in the last 168 hours Fasting Lipid Panel: No results found for this basename: CHOL, HDL, LDLCALC, TRIG, CHOLHDL, LDLDIRECT,  in the last 168 hours Thyroid Function Tests: No results found for this basename: TSH, T4TOTAL, FREET4, T3FREE, THYROIDAB,  in the last 168 hours Coagulation:  Recent Labs Lab 03/05/13 1645  LABPROT 21.9*  INR 1.98*   Anemia Panel: No results found for this basename: VITAMINB12, FOLATE, FERRITIN, TIBC, IRON, RETICCTPCT,  in the  last 168 hours Urine Drug Screen: Drugs of Abuse     Component Value Date/Time   LABOPIA NONE DETECTED 01/31/2013 1030   COCAINSCRNUR NONE DETECTED 01/31/2013 1030   LABBENZ NONE DETECTED 01/31/2013 1030   AMPHETMU NONE DETECTED 01/31/2013 1030   THCU NONE DETECTED 01/31/2013 1030   LABBARB NONE DETECTED 01/31/2013 1030    Alcohol Level:  Recent Labs Lab 03/06/13 0900  ETH <11   Urinalysis:  Recent Labs Lab 03/06/13 0824  COLORURINE AMBER*  LABSPEC 1.025  PHURINE 5.5  GLUCOSEU NEGATIVE  HGBUR NEGATIVE  BILIRUBINUR SMALL*  KETONESUR NEGATIVE  PROTEINUR NEGATIVE  UROBILINOGEN 0.2  NITRITE NEGATIVE  LEUKOCYTESUR MODERATE*     Micro Results: Recent Results (from the past 240 hour(s))  MRSA PCR SCREENING     Status: None   Collection Time    03/05/13 10:40 PM      Result Value Ref Range Status   MRSA by PCR NEGATIVE  NEGATIVE  Final   Comment:            The GeneXpert MRSA Assay (FDA     approved for NASAL specimens     only), is one component of a     comprehensive MRSA colonization     surveillance program. It is not     intended to diagnose MRSA     infection nor to guide or     monitor treatment for     MRSA infections.   Studies/Results: No results found. Medications: I have reviewed the patient's current medications. Scheduled Meds: . buPROPion  150 mg Oral Daily  . ciprofloxacin  400 mg Intravenous Q12H  . folic acid  1 mg Oral Daily  . lactulose  20 g Oral BID  . multivitamin with minerals  1 tablet Oral Daily  . pantoprazole (PROTONIX) IV  40 mg Intravenous Q12H  . rifaximin  550 mg Oral BID  . sodium chloride  3 mL Intravenous Q12H  . thiamine  100 mg Oral Daily  . ursodiol  300 mg Oral BID   Continuous Infusions: . sodium chloride 150 mL/hr at 03/06/13 1115  . sodium chloride    . octreotide (SANDOSTATIN) infusion 50 mcg/hr (03/07/13 0258)   PRN Meds:.LORazepam, LORazepam, ondansetron (ZOFRAN) IV, ondansetron, traMADol  Assessment/Plan: 43 year old woman with a PMH of EtOH abuse, cirrhosis with esophageal varices who presents with complaint of hematemesis.   #UGIB - Bedside endoscopy was performed yesterday by Dr. Bosie Clos. He noted bleeding stigmata on her esophageal varices with nipple sign. Apparently vigorous bleeding from this area occurred during the procedure, which was stopped with esophageal variceal band ligation. No hematemesis overnight. Hgb 7.9 this am, s/p 2U PRBCs yesterday. Trend below.  - Appreciate GI recs - Advance to clear liquid diet, but no solid food today until further out from band ligation - PRBCs to keep Hgb >7.0, per Dr. Bosie Clos would NOT transfuse if HCT is around 23-24 - Monitor CBC q6h - Continue Octreotide gtt, IV Protonix and IV Rocephin  - Zofran prn nausea - Tramadol q6h prn pain  Hemoglobin  Date Value Ref Range Status  03/07/2013 7.9* 12.0 -  15.0 g/dL Final  01/08/1094 8.8* 04.5 - 15.0 g/dL Final  4/0/9811 8.6* 91.4 - 15.0 g/dL Final  07/11/2954 8.9* 21.3 - 15.0 g/dL Final  0/08/6576 8.9* 46.9 - 15.0 g/dL Final     REPEATED TO VERIFY     POST TRANSFUSION SPECIMEN    #Hypotension - Improved, BP currently 104/46. ~100s/40-50s at  baseline based on prior notes. She is asymptomatic and does not appear to be in hypovolemic shock. HR 60s (takes propranolol at home, but being held here). She is not actively bleeding. S/p ~2L NS bolus plus 2U PRBCs yesterday, now on continuous IVF. - Continue IVF NS @150cc /hr  - Continue to monitor and fluid resuscitate as needed  #History of alcohol abuse - Last admission patient was admitted with an BAC = 0.21 and was put on a CIWA protocol. She denies using EtOH since then. Ethanol level <11 here. - CIWA protocol - Folate, MV, thiamine - We discussed her drinking habits and recommended cessation, she is working towards a liver transplant  #Right hip pain - Patient reports fall on tailbone/right hip last Thursday. No issues with ambulation. Strength and sensation preserved. - XR bilateral hip and pelvis ordered  #Cirrhosis - Ammonia 35 (wnl). She has evidence of liver synthetic deficits including PT/INR 21.9/1.98, thrombocytopenia (128). - Continue lactulose, rifaximin, spironolactone, ursodiol  #Depression - Continue Wellbutrin  #VTE - SCDs   Dispo: Disposition is deferred at this time, awaiting improvement of current medical problems.  Anticipated discharge in approximately 2-4 day(s).   The patient does have a current PCP ([place], PA-C) and does not need an [facility] hospital follow-up appointment after discharge.  The patient does not have transportation limitations that hinder transportation to clinic appointments.  .Services Needed at time of discharge: Y = Yes, Blank = No PT:   OT:   RN:   Equipment:   Other:     LOS: 2 days   [name] Barrack, MD 03/07/2013, 7:21 AM

## 2013-03-07 NOTE — Discharge Instructions (Signed)
It was a pleasure taking care of you. - Please follow up with your primary care doctor as scheduled above. - For your hip pain, I recommend using heating pads alternating with ice. Please do not take any over-the-counter pain medications. Medications like ibuprofen or aspirin can worsen your gastrointestinal bleeding, and medications like Tylenol can worsen your liver disease. - Continue the good work with your sobriety. - Continue your medications for liver disease and propranolol, which will reduce your risk of re-bleeding. - If you develop recurrent bloody vomit, bloody stools, or dark or tarry stools, please call your doctor or come to the emergency department.

## 2013-03-07 NOTE — Progress Notes (Signed)
Patient ID: [name], female   DOB: October 08, 1970, 43 y.o.   MRN: [id number] [facility]  [name] 43 y.o. October 08, 1970   Subjective: Denies melena or hematochezia. Denies further hematemesis. Denies abdominal pain.  Objective: Vital signs in last 24 hours: Filed Vitals:   03/07/13 0800  BP: 71/38  Pulse: 69  Temp: 97.5 F (36.4 C)  Resp:     Physical Exam: Gen: lethargic, no acute distress Abd: epigastric tenderness with guarding, soft, nondistended, +BS  Lab Results:  Recent Labs  03/06/13 0145 03/07/13 0545  NA 142 139  K 4.1 3.8  CL 111 109  CO2 19 18*  GLUCOSE 75 111*  BUN 17 16  CREATININE 0.77 0.73  CALCIUM 7.2* 8.1*    Recent Labs  03/05/13 1645  AST 52*  ALT 17  ALKPHOS 284*  BILITOT 1.3*  PROT 6.0  ALBUMIN 2.1*    Recent Labs  03/05/13 1645  03/06/13 2300 03/07/13 0545  WBC 5.2  < > 4.3 4.1  NEUTROABS 2.8  --   --   --   HGB 6.9*  < > 8.8* 7.9*  HCT 21.6*  < > 26.9* 24.5*  MCV 98.2  < > 95.1 95.7  PLT 158  < > 146* 136*  < > = values in this interval not displayed.  Recent Labs  03/05/13 1645  LABPROT 21.9*  INR 1.98*      Assessment/Plan: 43 yo with cirrhosis and s/p variceal bleed with banding yesterday. Hgb 7.9. No signs of further bleeding. Will allow clear liquids but no solid food today until further out from band ligation. Continue Octreotide drip and Protonix IV Q 12 hours. Would NOT transfuse if HCT is around 23-24. Continue Abx. Will follow.   [name] 03/07/2013, 9:09 AM

## 2013-03-07 NOTE — Discharge Summary (Signed)
Name: [name] MRN: [id number] DOB: 1970/04/09 43 y.o. PCP: [name] Furry, PA-C  Date of Admission: 03/05/2013  3:54 PM Date of Discharge: 03/10/2013 Attending Physician: [name] Blue, DO  Discharge Diagnosis: Principal Problem:   GI bleeding 2/2 esophageal varices in alcoholic cirrhosis Active Problems:   Ascites   Jaundice   Hepatitis   Esophageal varices in alcoholic cirrhosis   Alcohol abuse   Cirrhosis of liver   Hematemesis  Discharge Medications:   Medication List    STOP taking these medications       aspirin EC 325 MG tablet      TAKE these medications       buPROPion 150 MG 24 hr tablet  Commonly known as:  WELLBUTRIN XL  Take 150 mg by mouth daily.     ciprofloxacin 500 MG tablet  Commonly known as:  CIPRO  Take 1 tablet (500 mg total) by mouth every evening.     folic acid 800 MCG tablet  Commonly known as:  FOLVITE  Take 800 mcg by mouth daily.     furosemide 20 MG tablet  Commonly known as:  LASIX  Take 1 tablet (20 mg total) by mouth 2 (two) times daily.     lactulose 10 GM/15ML solution  Commonly known as:  CHRONULAC  Take 20 g by mouth 2 (two) times daily.     pantoprazole 40 MG tablet  Commonly known as:  PROTONIX  Take 40 mg by mouth 2 (two) times daily.     potassium chloride 20 MEQ/15ML (10%) solution  Take 20 mEq by mouth 2 (two) times daily.     propranolol 20 MG tablet  Commonly known as:  INDERAL  Take 20 mg by mouth daily.     rifaximin 550 MG Tabs tablet  Commonly known as:  XIFAXAN  Take 550 mg by mouth 2 (two) times daily.     spironolactone 100 MG tablet  Commonly known as:  ALDACTONE  Take 100 mg by mouth daily.     thiamine 100 MG tablet  Commonly known as:  VITAMIN B-1  Take 100 mg by mouth daily.     traMADol 50 MG tablet  Commonly known as:  ULTRAM  Take 1 tablet (50 mg total) by mouth every 6 (six) hours as needed for moderate pain.     ursodiol 300 MG capsule  Commonly known as:  ACTIGALL  Take  300 mg by mouth 2 (two) times daily.     WOMENS ONE DAILY Tabs  Take 1 tablet by mouth daily.        Disposition and follow-up:   [name] was discharged from [facility] in Stable condition.  At the hospital follow up visit please address:  1.  Sobriety? Progress towards getting on the liver transplant list? Please refer to sports medicine or orthopedics if she is having continued hip pain.  2.  Labs / imaging needed at time of follow-up: CBC, blood pressure check.  3.  Pending labs/ test needing follow-up: None.  Follow-up Appointments: Follow-up Information   Follow up with King, [name] Foster, PA-C On 03/13/2013. (@10 :30am. This is your primary care doctor.)    Specialty:  Physician Assistant   Contact information:   [facility] [address] [place] [address] [phone]       Follow up with [name]., MD. Schedule an appointment as soon as possible for a visit in 4 weeks.   Specialty:  Gastroenterology   Contact  information:   [address] [place] [address] [phone]       Discharge Instructions: Discharge Orders   Future Orders Complete By Expires   Diet - low sodium heart healthy  As directed    For home use only DME Cane  As directed    Increase activity slowly  As directed       Consultations: Treatment Team:  [name] Reasonsobert V Buccini, MD [name] FriarVincent C. Schooler, MD  Procedures Performed:  Dg Hip Bilateral W/pelvis  03/07/2013   CLINICAL DATA:  right hip and tailbone pain s/p fall  EXAM: BILATERAL HIP WITH PELVIS - 4+ VIEW  COMPARISON:  None.  FINDINGS: No acute bony abnormality. Specifically, no fracture, subluxation, or dislocation. Soft tissues are intact.  IMPRESSION: No acute bony abnormality.   Electronically Signed   By: [name] M.D.   On: 03/07/2013 09:25    Admission HPI:  [name] is a 43 year old woman with a PMH of EtOH abuse, cirrhosis with esophageal varices who presents with  complaint of hematemesis. She was in her usual state of health at home today. She says she ate and drank normally, took all of her medications as prescribed and denies recent EtOH use. She felt as if she had to have a BM. When she got to the bathroom door the room started spinning and she felt lightheaded. She was able to make it to the commode and began vomiting up bright red blood. When asked to quantify the amount she says it would fill about 5 cups. She denies chest pain or LOC. She was able to make it to the phone and called 911.  Of note, she was recently hospitalized for bleeding esophageal varices 02/15-02/17/2015.  In the ED: T98.77F, RR18, SpO2 100% HR 71, BP 91/36; she received a 1L NSS bolus, octreotide, protonix, zofran and rocephin.  Physical Exam:  Blood pressure 81/42, pulse 72, temperature 98 F (36.7 C), temperature source Oral, resp. rate 12, SpO2 100.00%.  General: resting in bed in NAD  HEENT: PERRL, EOMI, oropharynx clear and moist  Cardiac: RRR, no rubs, murmurs or gallops  Pulm: clear to auscultation bilaterally, moving normal volumes of air  Abd: soft, nontender, BS present, + distention, + hepatomegaly  Ext: warm and well perfused, no pedal edema  Neuro: alert and oriented X3, cranial nerves II-XII grossly intact, 5/5 MMS   Hospital Course by problem list: 43 year old woman with a PMH of EtOH abuse, cirrhosis with esophageal varices who presents with complaint of hematemesis.   1. UGIB 2/2 esophageal varices - Patient presents with hematemesis at home consistent with UGIB, felt likely due to bleeding esophageal varices given patient's history. Octreotide gtt, IV Protonix and IV Rocephin were started. Hgb 6.9 > 8.9 s/p 2U PRBCs. FOBT positive on admission as expected. Troponins trended and negative x3. She had no further episodes of hematemesis here. Bedside endoscopy was performed on 3/5 by Dr. Bosie King. He noted bleeding stigmata of her esophageal varices with nipple  sign. Apparently vigorous bleeding from this area occurred during the procedure, which was stopped with esophageal variceal band ligation. On 3/6 she was advanced to a clear liquid diet, which she tolerated well. Hemoglobin was monitored every 6 hours with trend as below. On 3/7 she was advanced to a regular diet and on 3/8 she was transitioned to oral PPI as well as her home beta blocker for prophylaxis. She was also discharged on Ciprofloxacin 500mg  BID, last dose on  night of discharge.  Hemoglobin  Date Value Ref Range Status  03/09/2013 8.0* 12.0 - 15.0 g/dL Final  01/08/1094 8.4* 04.5 - 15.0 g/dL Final  4/0/9811 7.7* 91.4 - 15.0 g/dL Final  07/11/2954 7.9* 21.3 - 15.0 g/dL Final  0/08/6576 7.8* 46.9 - 15.0 g/dL Final  06/05/9526 8.5* 41.3 - 15.0 g/dL Final  02/07/4008 8.4* 27.2 - 15.0 g/dL Final  05/06/6642 7.9* 03.4 - 15.0 g/dL Final  07/06/2593 8.2* 63.8 - 15.0 g/dL Final  07/08/6431 8.8* 29.5 - 15.0 g/dL Final    2. Hypotension - BP 70-100s/20-40s since admission. It is ~100s/40-50s at baseline based on prior notes. She was admitted to the step down unit for close monitoring. She was consistently asymptomatic with these pressures and did not appear to be in hypovolemic shock. No tachycardia noted, HR 60s (takes propranolol at home, but it was held here). No active bleeding as bove. On admission she was given ~2L NS bolus plus 2U PRBCs, followed by continuous IVF NS @150cc /hr. IVF were stopped on 3/6 as patient continued to be asymptomatic and was tolerating good PO.  3. History of alcohol abuse - Last admission patient was admitted with an BAC = 0.21 and was put on a CIWA protocol. She denied using EtOH since then. Ethanol level <11 here. We placed her on the CIWA protocol, scores were ~1 for anxiety only. She was given folate, MV, thiamine. We discussed her drinking habits and recommended continued cessation, she is working towards a liver transplant.  4. Right hip pain - Patient reported a fall onto her  tailbone/right hip last Thursday after slipping on a linoleum floor. No issues with ambulation. Strength and sensation preserved. Straight leg raise negative. XR bilateral hip and pelvis ordered with no bony abnormality. Recommend follow up and referral to sports medicine versus orthopedics.  5. Cirrhosis - Ammonia 35 (wnl) on admission. She has laboratory evidence of liver synthetic deficits including PT/INR 21.9/1.98, thrombocytopenia (128). We continued lactulose, rifaximin, spironolactone, ursodiol. There was no suggestion of hepatic encephalopathy.  6. Depression - We continued Wellbutrin.   Discharge Vitals:   BP 117/59  Pulse 81  Temp(Src) 98.9 F (37.2 C) (Oral)  Resp 18  Ht 5\' 4"  (1.626 m)  Wt 168 lb 11.2 oz (76.522 kg)  BMI 28.94 kg/m2  SpO2 97%  Discharge Labs:  BMET    Component Value Date/Time   NA 139 03/09/2013 0633   K 4.3 03/09/2013 0633   CL 106 03/09/2013 0633   CO2 24 03/09/2013 0633   GLUCOSE 112* 03/09/2013 0633   BUN 6 03/09/2013 0633   CREATININE 0.59 03/09/2013 0633   CALCIUM 8.1* 03/09/2013 0633   GFRNONAA >90 03/09/2013 0633   GFRAA >90 03/09/2013 0633    CBC    Component Value Date/Time   WBC 6.7 03/09/2013 2239   RBC 2.53* 03/09/2013 2239   HGB 8.0* 03/09/2013 2239   HCT 24.7* 03/09/2013 2239   PLT 147* 03/09/2013 2239   MCV 97.6 03/09/2013 2239   MCH 31.6 03/09/2013 2239   MCHC 32.4 03/09/2013 2239   RDW 18.4* 03/09/2013 2239   LYMPHSABS 1.6 03/05/2013 1645   MONOABS 0.6 03/05/2013 1645   EOSABS 0.1 03/05/2013 1645   BASOSABS 0.1 03/05/2013 1645    Signed: Vivi Barrack, MD 03/11/2013, 10:36 AM   Time Spent on Discharge: 35 minutes Services Ordered on Discharge: None Equipment Ordered on Discharge: None

## 2013-03-07 NOTE — Progress Notes (Signed)
Night Float Interim Progress Note  Called by RN in regards to [name]'s BP 80/30's.  Went to evaluate patient, lying comfortably in bed wanting to eat. Tolerated clear liquid diet well. Denies any more episodes of hematemesis.  Repeat BP manually 90/56. Per day team documentation, IVF were to be running NS @150cc /hr, however were not running at this time.   Vitals reviewed. General: resting in bed, NAD HEENT: EOMI Cardiac: RRR Pulm: clear to auscultation bilaterally Abd: soft, nontender, BS present Ext: moving all 4 extermities Neuro: alert and oriented X3  [name] is a 43 year old female with PMH of alcoholic cirrhosis and hepatitis with prior history of esophageal varices bleeding s/p banding admitted for recurrent hematemesis. S/p bedside endoscopy by Dr. Bosie ClosSchooler 3/4 and subsequent variceal band ligation again. No more hematemesis noted by patient since admission.    -start NS @150cc /hr per day team instructions -continue octreotide gtt and protonix -continue clear liquid diet for now -will continue to monitor vitals closely  Signed: Darden PalmerSamaya Tyvon Eggenberger, MD PGY-2, Internal Medicine Resident Pager: [phone]  03/07/2013,11:49 PM

## 2013-03-07 NOTE — Progress Notes (Signed)
  Date: 03/07/2013  Patient name: [name]  Medical record number: [id number]  Date of birth: 1970-12-25   This patient has been seen and the plan of care was discussed with the house staff. Please see their note for complete details. I concur with their findings with the following additions/corrections: S/P EGD with esophageal variceal banding. She has no signs of further bleeding. Continuing octreotide for ~72 hrs and continue PPI IV q12 hrs. Agree with not transfusing over a Hgb of 8, due to risk of re-bleed. Advance diet per GI recs only. Hemodynamically stable.  Jonah BlueAlejandro Sekou Zuckerman, DO, FACP Faculty [facility] 03/07/2013, 2:24 PM

## 2013-03-08 LAB — CBC
HCT: 24.2 % — ABNORMAL LOW (ref 36.0–46.0)
HCT: 23.3 % — ABNORMAL LOW (ref 36.0–46.0)
HEMATOCRIT: 24 % — AB (ref 36.0–46.0)
HEMATOCRIT: 24.6 % — AB (ref 36.0–46.0)
HEMOGLOBIN: 7.9 g/dL — AB (ref 12.0–15.0)
HEMOGLOBIN: 7.7 g/dL — AB (ref 12.0–15.0)
Hemoglobin: 7.8 g/dL — ABNORMAL LOW (ref 12.0–15.0)
Hemoglobin: 8.2 g/dL — ABNORMAL LOW (ref 12.0–15.0)
MCH: 31 pg (ref 26.0–34.0)
MCH: 31.6 pg (ref 26.0–34.0)
MCH: 32 pg (ref 26.0–34.0)
MCH: 32 pg (ref 26.0–34.0)
MCHC: 32.2 g/dL (ref 30.0–36.0)
MCHC: 32.9 g/dL (ref 30.0–36.0)
MCHC: 33 g/dL (ref 30.0–36.0)
MCHC: 33.3 g/dL (ref 30.0–36.0)
MCV: 96 fL (ref 78.0–100.0)
MCV: 96 fL (ref 78.0–100.0)
MCV: 96.1 fL (ref 78.0–100.0)
MCV: 96.7 fL (ref 78.0–100.0)
PLATELETS: 134 10*3/uL — AB (ref 150–400)
PLATELETS: 141 10*3/uL — AB (ref 150–400)
Platelets: 139 10*3/uL — ABNORMAL LOW (ref 150–400)
Platelets: 125 10*3/uL — ABNORMAL LOW (ref 150–400)
RBC: 2.41 MIL/uL — AB (ref 3.87–5.11)
RBC: 2.5 MIL/uL — AB (ref 3.87–5.11)
RBC: 2.52 MIL/uL — AB (ref 3.87–5.11)
RBC: 2.56 MIL/uL — AB (ref 3.87–5.11)
RDW: 18.4 % — ABNORMAL HIGH (ref 11.5–15.5)
RDW: 18.9 % — ABNORMAL HIGH (ref 11.5–15.5)
RDW: 18.9 % — ABNORMAL HIGH (ref 11.5–15.5)
RDW: 19 % — ABNORMAL HIGH (ref 11.5–15.5)
WBC: 4.2 10*3/uL (ref 4.0–10.5)
WBC: 4.4 10*3/uL (ref 4.0–10.5)
WBC: 4.6 10*3/uL (ref 4.0–10.5)
WBC: 5.3 10*3/uL (ref 4.0–10.5)

## 2013-03-08 LAB — PROTIME-INR
INR: 2.03 — ABNORMAL HIGH (ref 0.00–1.49)
Prothrombin Time: 22.3 seconds — ABNORMAL HIGH (ref 11.6–15.2)

## 2013-03-08 NOTE — Progress Notes (Addendum)
Admission note:  Arrival Method: from 2 heart via wheel chair  Mental Orientation: alert and oriented x 4  Telemetry: Box #13. Central tele made aware of pt's transfer   Assessment: WDL; chronic back pain so ultram given  Skin: ecchymosis to bilateral upper extremities  IV: Right forearm date for the 3rd Pain: pt rates at 7 Tubes: N/A beside IV tubing  Safety Measures: Patient Handbook has been given, and discussed the Fall Prevention worksheet. Left at bedside    6E Orientation: Patient has been oriented to the unit, staff and to the room.

## 2013-03-08 NOTE — Progress Notes (Signed)
Subjective: Patient seen and examined at the bedside this morning. She is sleepy but arousable. She has no complaints. Denies hematemesis. No dizziness or lightheadedness. She is hungry but understands the reasoning behind the soft diet.  Objective: Vital signs in last 24 hours: Filed Vitals:   03/08/13 0326 03/08/13 0327 03/08/13 0748 03/08/13 0750  BP:  102/72 90/39 90/39   Pulse:  70 64   Temp:  98.3 F (36.8 C) 98.9 F (37.2 C)   TempSrc:  Oral Oral   Resp:  16 16   Height:      Weight: 172 lb 13.5 oz (78.4 kg)     SpO2:  100% 100%    Weight change: -1 lb 12.2 oz (-0.8 kg)  Intake/Output Summary (Last 24 hours) at 03/08/13 1134 Last data filed at 03/08/13 1100  Gross per 24 hour  Intake   3103 ml  Output    200 ml  Net   2903 ml   Physical Exam:  General: resting in bed in NAD, sleepy but arousable and talkative, jaundiced skin HEENT: PERRL, EOMI, oropharynx clear and moist  Cardiac: RRR, no rubs, murmurs or gallops  Pulm: clear to auscultation bilaterally, moving normal volumes of air  Abd: soft, nontender, BS present, +distention, +hepatomegaly  Ext: warm and well perfused, no pedal edema  Neuro: alert and oriented X3, cranial nerves II-XII grossly intact, 5/5 MMS, SLR negative  Lab Results: Basic Metabolic Panel:  Recent Labs Lab 03/06/13 0145 03/07/13 0545  NA 142 139  K 4.1 3.8  CL 111 109  CO2 19 18*  GLUCOSE 75 111*  BUN 17 16  CREATININE 0.77 0.73  CALCIUM 7.2* 8.1*   Liver Function Tests:  Recent Labs Lab 03/05/13 1645  AST 52*  ALT 17  ALKPHOS 284*  BILITOT 1.3*  PROT 6.0  ALBUMIN 2.1*   No results found for this basename: LIPASE, AMYLASE,  in the last 168 hours  Recent Labs Lab 03/05/13 1612  AMMONIA 35   CBC:  Recent Labs Lab 03/05/13 1645  03/08/13 0248 03/08/13 1040  WBC 5.2  < > 4.6 4.4  NEUTROABS 2.8  --   --   --   HGB 6.9*  < > 7.8* 7.9*  HCT 21.6*  < > 24.2* 24.0*  MCV 98.2  < > 96.0 96.0  PLT 158  < > 141*  139*  < > = values in this interval not displayed. Cardiac Enzymes:  Recent Labs Lab 03/05/13 2115 03/06/13 0120 03/06/13 0900  TROPONINI <0.30 <0.30 <0.30   BNP: No results found for this basename: PROBNP,  in the last 168 hours D-Dimer: No results found for this basename: DDIMER,  in the last 168 hours CBG:  Recent Labs Lab 03/06/13 0826 03/07/13 0802 03/07/13 1227 03/07/13 1629  GLUCAP 71 117* 104* 109*   Hemoglobin A1C: No results found for this basename: HGBA1C,  in the last 168 hours Fasting Lipid Panel: No results found for this basename: CHOL, HDL, LDLCALC, TRIG, CHOLHDL, LDLDIRECT,  in the last 168 hours Thyroid Function Tests: No results found for this basename: TSH, T4TOTAL, FREET4, T3FREE, THYROIDAB,  in the last 168 hours Coagulation:  Recent Labs Lab 03/05/13 1645 03/08/13 1040  LABPROT 21.9* 22.3*  INR 1.98* 2.03*   Anemia Panel: No results found for this basename: VITAMINB12, FOLATE, FERRITIN, TIBC, IRON, RETICCTPCT,  in the last 168 hours Urine Drug Screen: Drugs of Abuse     Component Value Date/Time   LABOPIA NONE DETECTED 01/31/2013  1030   COCAINSCRNUR NONE DETECTED 01/31/2013 1030   LABBENZ NONE DETECTED 01/31/2013 1030   AMPHETMU NONE DETECTED 01/31/2013 1030   THCU NONE DETECTED 01/31/2013 1030   LABBARB NONE DETECTED 01/31/2013 1030    Alcohol Level:  Recent Labs Lab 03/06/13 0900  ETH <11   Urinalysis:  Recent Labs Lab 03/06/13 0824  COLORURINE AMBER*  LABSPEC 1.025  PHURINE 5.5  GLUCOSEU NEGATIVE  HGBUR NEGATIVE  BILIRUBINUR SMALL*  KETONESUR NEGATIVE  PROTEINUR NEGATIVE  UROBILINOGEN 0.2  NITRITE NEGATIVE  LEUKOCYTESUR MODERATE*     Micro Results: Recent Results (from the past 240 hour(s))  MRSA PCR SCREENING     Status: None   Collection Time    03/05/13 10:40 PM      Result Value Ref Range Status   MRSA by PCR NEGATIVE  NEGATIVE Final   Comment:            The GeneXpert MRSA Assay (FDA     approved for  NASAL specimens     only), is one component of a     comprehensive MRSA colonization     surveillance program. It is not     intended to diagnose MRSA     infection nor to guide or     monitor treatment for     MRSA infections.   Studies/Results: Dg Hip Bilateral W/pelvis  03/07/2013   CLINICAL DATA:  right hip and tailbone pain s/p fall  EXAM: BILATERAL HIP WITH PELVIS - 4+ VIEW  COMPARISON:  None.  FINDINGS: No acute bony abnormality. Specifically, no fracture, subluxation, or dislocation. Soft tissues are intact.  IMPRESSION: No acute bony abnormality.   Electronically Signed   By: Salome Holmes M.D.   On: 03/07/2013 09:25   Medications: I have reviewed the patient's current medications. Scheduled Meds: . buPROPion  150 mg Oral Daily  . ciprofloxacin  400 mg Intravenous Q12H  . folic acid  1 mg Oral Daily  . lactulose  20 g Oral BID  . multivitamin with minerals  1 tablet Oral Daily  . pantoprazole (PROTONIX) IV  40 mg Intravenous Q12H  . rifaximin  550 mg Oral BID  . sodium chloride  3 mL Intravenous Q12H  . thiamine  100 mg Oral Daily  . ursodiol  300 mg Oral BID   Continuous Infusions: . sodium chloride    . octreotide (SANDOSTATIN) infusion 50 mcg/hr (03/08/13 0237)   PRN Meds:.LORazepam, LORazepam, ondansetron (ZOFRAN) IV, ondansetron, traMADol  Assessment/Plan: 43 year old woman with a PMH of EtOH abuse, cirrhosis with esophageal varices who presents with complaint of hematemesis.   #UGIB - No hematemesis overnight. Hgb stable at 7.9 this am, s/p 2U PRBCs on 3/4. Trend below. She tolerated her CLD and wishes to advance it. - Appreciate GI recs - Full liquid diet, to be advanced as tolerated to soft diet only today - PRBCs to keep Hgb >7.0, per Dr. Bosie Clos would NOT transfuse if HCT is around 23-24 - Monitor CBC q6h - Recheck CMP, PT/INR - Continue Octreotide gtt, IV Protonix and IV Rocephin  - Zofran prn nausea - Tramadol q6h prn pain - Transfer to telemetry  bed  Hemoglobin  Date Value Ref Range Status  03/08/2013 7.9* 12.0 - 15.0 g/dL Final  01/08/1094 7.8* 04.5 - 15.0 g/dL Final  4/0/9811 8.5* 91.4 - 15.0 g/dL Final  07/11/2954 8.4* 21.3 - 15.0 g/dL Final  0/08/6576 7.9* 46.9 - 15.0 g/dL Final    #Hypotension - Improved, BP currently  90-102/30s. Soft pressures at baseline. She is asymptomatic and not in hypovolemic shock. HR 60-70s (takes propranolol at home, but being held here). She is not actively bleeding, Hgb stable, and tolerating good po. - D/c IVF - Encourage po fluid intake - Continue to monitor and fluid resuscitate as needed  #History of alcohol abuse - Last admission patient was admitted with an BAC = 0.21 and was put on a CIWA protocol. She denies using EtOH since then. Ethanol level <11 here. CIWAs have been 0-1. - CIWA protocol - Folate, MV, thiamine - We discussed her drinking habits and recommended cessation, she is working towards a liver transplant  #Right hip pain - Patient reports fall on tailbone/right hip last Thursday. No issues with ambulation. Strength and sensation preserved. Negative SLR. XR bilateral hip and pelvis showed no acute bony abnormality. - Heating pads prn - Will recommend outpatient sports medicine vs. Orthopedics referral on discharge summary - Counseled the patient to avoid NSAIDs and Tylenol at home  #Cirrhosis - Ammonia 35 (wnl). She has evidence of liver synthetic deficits including PT/INR 21.9/1.98, thrombocytopenia (128). - Continue lactulose, rifaximin, spironolactone, ursodiol  #Depression - Continue Wellbutrin  #VTE - SCDs   Dispo: Disposition is deferred at this time, awaiting improvement of current medical problems.  Anticipated discharge in approximately 2-4 day(s).   The patient does have a current PCP (S[place] LeoneBrittany Hout, PA-C) and does not [facility] after discharge.  The patient does not have transportation limitations that hinder transportation to  clinic appointments.  .Services Needed at time of discharge: Y = Yes, Blank = No PT:   OT:   RN:   Equipment:   Other:     LOS: 3 days   Vivi BarrackSarah Rayvon Dakin, MD 03/08/2013, 11:34 AM

## 2013-03-08 NOTE — Progress Notes (Signed)
Heating pads applied to right lower back , with relief.

## 2013-03-08 NOTE — Progress Notes (Signed)
  Date: 03/08/2013  Patient name: [name]  Medical record number: [id number]  Date of birth: June 08, 1970   This patient has been seen and the plan of care was discussed with the house staff. Please see their note for complete details. I concur with their findings with the following additions/corrections: Hemodynamics stable. No SOB, CP, dizziness, hematemesis.  Slow advancing of diet. Soft Diet today per GI. Still on octreotide gtt. No signs of further bleeding.  Jonah BlueAlejandro Natayla Cadenhead, DO, FACP Faculty [facility] 03/08/2013, 11:53 AM

## 2013-03-08 NOTE — Progress Notes (Signed)
Patient ID: [name], female   DOB: 02-May-1970, 43 y.o.   MRN: [id number] [facility]  [name] 43 y.o. 02-May-1970   Subjective: Resting in bed states could not sleep last night. No vomiting, no BMs. Denies abdominal pain. Wants to eat. BP in 90's last night Objective: Vital signs in last 24 hours: Filed Vitals:   03/08/13 0327  BP: 102/72  Pulse: 70  Temp: 98.3 F (36.8 C)  Resp: 16    Physical Exam: Gen: alert, no acute distress Abd: diffusely tender with guarding, soft, nondistended, +BS  Lab Results:  Recent Labs  03/06/13 0145 03/07/13 0545  NA 142 139  K 4.1 3.8  CL 111 109  CO2 19 18*  GLUCOSE 75 111*  BUN 17 16  CREATININE 0.77 0.73  CALCIUM 7.2* 8.1*    Recent Labs  03/05/13 1645  AST 52*  ALT 17  ALKPHOS 284*  BILITOT 1.3*  PROT 6.0  ALBUMIN 2.1*    Recent Labs  03/05/13 1645  03/07/13 1540 03/08/13 0248  WBC 5.2  < > 4.4 4.6  NEUTROABS 2.8  --   --   --   HGB 6.9*  < > 8.5* 7.8*  HCT 21.6*  < > 25.2* 24.2*  MCV 98.2  < > 95.5 96.0  PLT 158  < > 147* 141*  < > = values in this interval not displayed.  Recent Labs  03/05/13 1645  LABPROT 21.9*  INR 1.98*      Assessment/Plan: 43 yo with cirrhosis s/p variceal bleed with banding who has not had any signs of recurrent bleeding. Hgb 7.8. Changed to full liquids this morning. Would not advance beyond soft diet today. Will recheck INR and CMET today. Continue Octreotide drip.   Rut Betterton C. 03/08/2013, 7:24 AM

## 2013-03-08 NOTE — Progress Notes (Signed)
Transferred to 6east room13 by wheelchair, stable , report given to RN. Belongings with pt.

## 2013-03-09 LAB — COMPREHENSIVE METABOLIC PANEL
ALT: 20 U/L (ref 0–35)
AST: 50 U/L — ABNORMAL HIGH (ref 0–37)
Albumin: 2.1 g/dL — ABNORMAL LOW (ref 3.5–5.2)
Alkaline Phosphatase: 264 U/L — ABNORMAL HIGH (ref 39–117)
BUN: 6 mg/dL (ref 6–23)
CALCIUM: 8.1 mg/dL — AB (ref 8.4–10.5)
CO2: 24 meq/L (ref 19–32)
CREATININE: 0.59 mg/dL (ref 0.50–1.10)
Chloride: 106 mEq/L (ref 96–112)
GLUCOSE: 112 mg/dL — AB (ref 70–99)
Potassium: 4.3 mEq/L (ref 3.7–5.3)
Sodium: 139 mEq/L (ref 137–147)
Total Bilirubin: 1.3 mg/dL — ABNORMAL HIGH (ref 0.3–1.2)
Total Protein: 5.6 g/dL — ABNORMAL LOW (ref 6.0–8.3)

## 2013-03-09 LAB — CBC
HCT: 26.6 % — ABNORMAL LOW (ref 36.0–46.0)
HCT: 24.7 % — ABNORMAL LOW (ref 36.0–46.0)
HEMOGLOBIN: 8.4 g/dL — AB (ref 12.0–15.0)
Hemoglobin: 8 g/dL — ABNORMAL LOW (ref 12.0–15.0)
MCH: 30.7 pg (ref 26.0–34.0)
MCH: 31.6 pg (ref 26.0–34.0)
MCHC: 31.6 g/dL (ref 30.0–36.0)
MCHC: 32.4 g/dL (ref 30.0–36.0)
MCV: 97.1 fL (ref 78.0–100.0)
MCV: 97.6 fL (ref 78.0–100.0)
Platelets: 142 10*3/uL — ABNORMAL LOW (ref 150–400)
Platelets: 147 10*3/uL — ABNORMAL LOW (ref 150–400)
RBC: 2.74 MIL/uL — AB (ref 3.87–5.11)
RBC: 2.53 MIL/uL — AB (ref 3.87–5.11)
RDW: 18.4 % — ABNORMAL HIGH (ref 11.5–15.5)
RDW: 18.4 % — ABNORMAL HIGH (ref 11.5–15.5)
WBC: 5.6 10*3/uL (ref 4.0–10.5)
WBC: 6.7 10*3/uL (ref 4.0–10.5)

## 2013-03-09 LAB — TYPE AND SCREEN
ABO/RH(D): O NEG
ANTIBODY SCREEN: NEGATIVE
UNIT DIVISION: 0
Unit division: 0
Unit division: 0

## 2013-03-09 LAB — GLUCOSE, CAPILLARY: Glucose-Capillary: 136 mg/dL — ABNORMAL HIGH (ref 70–99)

## 2013-03-09 MED ORDER — CIPROFLOXACIN HCL 500 MG PO TABS
500.0000 mg | ORAL_TABLET | Freq: Two times a day (BID) | ORAL | Status: DC
  Administered 2013-03-09 – 2013-03-10 (×2): 500 mg via ORAL
  Filled 2013-03-09 (×4): qty 1

## 2013-03-09 MED ORDER — PANTOPRAZOLE SODIUM 40 MG IV SOLR
40.0000 mg | INTRAVENOUS | Status: DC
  Administered 2013-03-10: 40 mg via INTRAVENOUS
  Filled 2013-03-09 (×2): qty 40

## 2013-03-09 NOTE — Progress Notes (Signed)
Patient ID: [name], female   DOB: 01/25/1970, 43 y.o.   MRN: [id number] [facility]  [name] 43 y.o. 01/25/1970   Subjective: Sitting on side of bed. Wants to go home. Reports dark brown stool. Denies abdominal pain.  Objective: Vital signs in last 24 hours: Filed Vitals:   03/09/13 0735  BP: 97/59  Pulse: 69  Temp: 98.7 F (37.1 C)  Resp: 16    Physical Exam: Gen: alert, no acute distress Abd: soft, nontender, nondistended, +BS  Lab Results:  Recent Labs  03/07/13 0545 03/09/13 0633  NA 139 139  K 3.8 4.3  CL 109 106  CO2 18* 24  GLUCOSE 111* 112*  BUN 16 6  CREATININE 0.73 0.59  CALCIUM 8.1* 8.1*    Recent Labs  03/09/13 0633  AST 50*  ALT 20  ALKPHOS 264*  BILITOT 1.3*  PROT 5.6*  ALBUMIN 2.1*    Recent Labs  03/08/13 2303 03/09/13 1020  WBC 5.3 5.6  HGB 7.7* 8.4*  HCT 23.3* 26.6*  MCV 96.7 97.1  PLT 125* 142*    Recent Labs  03/08/13 1040  LABPROT 22.3*  INR 2.03*      Assessment/Plan: 42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 No further bleeding. Hgb 8.4. Changed diet to low sodium diet. D/C Octreotide drip. Continue PPI IV and change to PO QD at discharge. Discussed her recent alcohol abuse from last month and she becomes upset about why I am bringing that up. When I mention that the [facility] transplant coordinator will obtain those records and that will likely prolong further counseling that she needs, she states "I wish they would burn those records." I am not convinced that she will remain off alcohol long enough to be evaluated by [facility] liver transplant center but she states that she will continue to go to counseling. Despite her not having any bleeding since endoscopy, my recommendation would be for to be watched another day in the hospital, advance her diet to low sodium diet and let her go home tomorrow if she is stable. Needs to f/u with me in 3-4 weeks. If she is stable on Sunday morning,  then she can be discharged.   Haydyn Liddell C. 03/09/2013, 11:11 AM

## 2013-03-09 NOTE — Progress Notes (Signed)
Subjective: She feels ready to go home today. She tolerated a regular diet for lunch. No overt bleeding. Denies dizziness, chest pain, shortness of breath, N/V/D or abdominal pain.   Objective: Vital signs in last 24 hours: Filed Vitals:   03/09/13 0735 03/09/13 1416 03/09/13 1715 03/09/13 2110  BP: 97/59 101/62 111/58 116/69  Pulse: 69 75 75 75  Temp: 98.7 F (37.1 C) 98 F (36.7 C) 98 F (36.7 C) 98.5 F (36.9 C)  TempSrc: Oral Oral Oral Oral  Resp: 16 18 18 18   Height:    5\' 4"  (1.626 m)  Weight:    168 lb 11.2 oz (76.522 kg)  SpO2: 96% 98% 98% 95%   Weight change: 2 lb 6.8 oz (1.1 kg)  Intake/Output Summary (Last 24 hours) at 03/09/13 2140 Last data filed at 03/09/13 1833  Gross per 24 hour  Intake 1678.75 ml  Output    352 ml  Net 1326.75 ml   General: resting in bed in NAD, more interactive today HEENT: oropharynx clear and moist  Cardiac: RRR, no rubs, murmurs or gallops  Pulm: clear to auscultation bilaterally, moving normal volumes of air  Abd: soft, nontender, BS present, +distention, +hepatomegaly  Ext: warm and well perfused, no pedal edema  Neuro: alert and oriented X3, moves all 4 extremities voluntarily, no asterixis   Lab Results: Basic Metabolic Panel:  Recent Labs Lab 03/07/13 0545 03/09/13 0633  NA 139 139  K 3.8 4.3  CL 109 106  CO2 18* 24  GLUCOSE 111* 112*  BUN 16 6  CREATININE 0.73 0.59  CALCIUM 8.1* 8.1*   Liver Function Tests:  Recent Labs Lab 03/05/13 1645 03/09/13 0633  AST 52* 50*  ALT 17 20  ALKPHOS 284* 264*  BILITOT 1.3* 1.3*  PROT 6.0 5.6*  ALBUMIN 2.1* 2.1*    Recent Labs Lab 03/05/13 1612  AMMONIA 35   CBC:  Recent Labs Lab 03/05/13 1645  03/08/13 2303 03/09/13 1020  WBC 5.2  < > 5.3 5.6  NEUTROABS 2.8  --   --   --   HGB 6.9*  < > 7.7* 8.4*  HCT 21.6*  < > 23.3* 26.6*  MCV 98.2  < > 96.7 97.1  PLT 158  < > 125* 142*  < > = values in this interval not displayed. Cardiac Enzymes:  Recent  Labs Lab 03/05/13 2115 03/06/13 0120 03/06/13 0900  TROPONINI <0.30 <0.30 <0.30   CBG:  Recent Labs Lab 03/06/13 0826 03/07/13 0802 03/07/13 1227 03/07/13 1629 03/09/13 0754  GLUCAP 71 117* 104* 109* 136*   Coagulation:  Recent Labs Lab 03/05/13 1645 03/08/13 1040  LABPROT 21.9* 22.3*  INR 1.98* 2.03*   Urine Drug Screen: Drugs of Abuse     Component Value Date/Time   LABOPIA NONE DETECTED 01/31/2013 1030   COCAINSCRNUR NONE DETECTED 01/31/2013 1030   LABBENZ NONE DETECTED 01/31/2013 1030   AMPHETMU NONE DETECTED 01/31/2013 1030   THCU NONE DETECTED 01/31/2013 1030   LABBARB NONE DETECTED 01/31/2013 1030    Alcohol Level:  Recent Labs Lab 03/06/13 0900  ETH <11   Urinalysis:  Recent Labs Lab 03/06/13 0824  COLORURINE AMBER*  LABSPEC 1.025  PHURINE 5.5  GLUCOSEU NEGATIVE  HGBUR NEGATIVE  BILIRUBINUR SMALL*  KETONESUR NEGATIVE  PROTEINUR NEGATIVE  UROBILINOGEN 0.2  NITRITE NEGATIVE  LEUKOCYTESUR MODERATE*    Micro Results: Recent Results (from the past 240 hour(s))  MRSA PCR SCREENING     Status: None   Collection Time  03/05/13 10:40 PM      Result Value Ref Range Status   MRSA by PCR NEGATIVE  NEGATIVE Final   Comment:            The GeneXpert MRSA Assay (FDA     approved for NASAL specimens     only), is one component of a     comprehensive MRSA colonization     surveillance program. It is not     intended to diagnose MRSA     infection nor to guide or     monitor treatment for     MRSA infections.   Medications: I have reviewed the patient's current medications. Scheduled Meds: . buPROPion  150 mg Oral Daily  . ciprofloxacin  500 mg Oral BID  . folic acid  1 mg Oral Daily  . lactulose  20 g Oral BID  . multivitamin with minerals  1 tablet Oral Daily  . [START ON 03/10/2013] pantoprazole (PROTONIX) IV  40 mg Intravenous Q24H  . rifaximin  550 mg Oral BID  . sodium chloride  3 mL Intravenous Q12H  . thiamine  100 mg Oral Daily  .  ursodiol  300 mg Oral BID   Continuous Infusions:  PRN Meds:.ondansetron (ZOFRAN) IV, ondansetron, traMADol Assessment/Plan: 43 year old woman with a PMH of EtOH abuse, cirrhosis with esophageal varices who presents with complaint of hematemesis.   #UGIB - No hematemesis overnight. Hgb stable at 8.4 this am, s/p 2U PRBCs on 3/4. Trend below. Her diet was advances to regular diet and she is tolerating it well.   - Appreciate GI recs  - Regular diet, 2g of sodium - PRBCs to keep Hgb >7.0, per Dr. Bosie ClosSchooler would NOT transfuse if HCT is around 23-24  - Monitor CBC q12h - Recheck CMP, PT/INR  - Discontinue Octreotide gtt,and IV Rocephin  - Start Cipro 500mg  BID per GI - Will transition to Protonix PO BID prior to discharge - Zofran prn nausea  - Tramadol q6h prn pain   #Hypotension - Improved, BP currently 90-102/30s. Soft pressures at baseline. She is asymptomatic and not in hypovolemic shock. HR 60-70s (takes propranolol at home, but being held here). She is not actively bleeding, Hgb stable, and tolerating good po.  - D/c IVF  - Encourage po fluid intake  - Continue to monitor and fluid resuscitate as needed   #History of alcohol abuse - Last admission patient was admitted with an BAC = 0.21 and was put on a CIWA protocol. She denies using EtOH since then. Ethanol level <11 here. CIWAs have been 0-1.  - CIWA protocol  - Folate, MV, thiamine  - We discussed her drinking habits and recommended cessation, she is working towards a liver transplant   #Right hip pain - Patient reports fall on tailbone/right hip last Thursday. No issues with ambulation. Strength and sensation preserved. Negative SLR. XR bilateral hip and pelvis showed no acute bony abnormality.  - Heating pads prn  - Will recommend outpatient sports medicine vs. Orthopedics referral on discharge summary  - Counseled the patient to avoid NSAIDs and Tylenol at home   #Cirrhosis - Ammonia 35 (wnl). She has evidence of liver  synthetic deficits including PT/INR 21.9/1.98, thrombocytopenia (128).  - Continue lactulose, rifaximin, spironolactone, ursodiol   #Depression - Continue Wellbutrin   #VTE - SCDs    Dispo: Disposition is deferred at this time, awaiting improvement of current medical problems.  Anticipated discharge in approximately tomorrow.   The patient does have  a current PCP ([place], PA-C) and does not need an [facility] hospital follow-up appointment after discharge.  The patient does not have transportation limitations that hinder transportation to clinic appointments.  .Services Needed at time of discharge: Y = Yes, Blank = No PT:   OT:   RN:   Equipment:   Other:     LOS: 4 days   Ky Barban, MD 03/09/2013, 9:40 PM

## 2013-03-09 NOTE — Progress Notes (Signed)
03/09/13 01:35 Patient requesting for a sleeping pill,MD on call notified.MD to call back. 02:30 No call back received. Jerimiah Wolman Joselita,RN

## 2013-03-10 ENCOUNTER — Encounter (HOSPITAL_COMMUNITY): Payer: Self-pay | Admitting: *Deleted

## 2013-03-10 MED ORDER — CIPROFLOXACIN HCL 500 MG PO TABS: 500.0000 mg | ORAL_TABLET | Freq: Every evening | ORAL | Status: DC

## 2013-03-10 NOTE — Progress Notes (Signed)
Patient ID: [name], female   DOB: 11-Aug-1970, 43 y.o.   MRN: [id number] [facility]  [name] Shownnita Christian 43 y.o. 11-Aug-1970   Subjective: Sleeping comfortably. Friend at bedside. Able to arouse pt. Denies abdominal pain. Denies BMs. Complains of back pain. Tolerating diet. She has not ambulated in the halls stating she has only walked around the room.  Objective: Vital signs in last 24 hours: Filed Vitals:   03/10/13 0834  BP: 117/59  Pulse: 81  Temp: 98.9 F (37.2 C)  Resp: 18    Physical Exam: Gen: no acute distress Abd: tender periumbilically with minimal guarding, soft, nondistended, +BS  Lab Results:  Recent Labs  03/09/13 0633  NA 139  K 4.3  CL 106  CO2 24  GLUCOSE 112*  BUN 6  CREATININE 0.59  CALCIUM 8.1*    Recent Labs  03/09/13 0633  AST 50*  ALT 20  ALKPHOS 264*  BILITOT 1.3*  PROT 5.6*  ALBUMIN 2.1*    Recent Labs  03/09/13 1020 03/09/13 2239  WBC 5.6 6.7  HGB 8.4* 8.0*  HCT 26.6* 24.7*  MCV 97.1 97.6  PLT 142* 147*    Recent Labs  03/08/13 1040  LABPROT 22.3*  INR 2.03*      Assessment/Plan: 43 yo with cirrhosis from alcohol abuse and has history of PSC with recent variceal bleed requiring banding. No further bleeding since EGD. Encouraged ambulation. Stable to d/c home from GI standpoint. Needs one additional dose of Cipro tonight for home to complete a 5 day course. Defer her question about referral for her back to the primary team. F/U with me in 4 weeks. Will sign off. Call if questions.   Salvador Coupe C. 03/10/2013, 10:06 AM

## 2013-03-10 NOTE — Progress Notes (Signed)
Subjective: She feels well today and wants to go home as soon as possible. Refused labs stating that she is tired of the "sticking". Had nausea last night after eating a large meal but denies vomiting. She had two BMs yesterday that were dark in color but with no bright red blood.  Denies chest pain, cough, SOB, abdominal pain, or dysuria.   Objective: Vital signs in last 24 hours: Filed Vitals:   03/09/13 1715 03/09/13 2110 03/10/13 0519 03/10/13 0834  BP: 111/58 116/69 104/63 117/59  Pulse: 75 75 79 81  Temp: 98 F (36.7 C) 98.5 F (36.9 C) 98.4 F (36.9 C) 98.9 F (37.2 C)  TempSrc: Oral Oral Oral Oral  Resp: 18 18 18 18   Height:  5\' 4"  (1.626 m)    Weight:  168 lb 11.2 oz (76.522 kg)    SpO2: 98% 95% 96% 97%   Weight change: -6 lb 9.1 oz (-2.978 kg)  Intake/Output Summary (Last 24 hours) at 03/10/13 1128 Last data filed at 03/10/13 0000  Gross per 24 hour  Intake 1022.92 ml  Output      0 ml  Net 1022.92 ml   Vitals reviewed General: Sitting in bed in NAD, interactive, pleasant  HEENT: oropharynx clear and moist  Cardiac: RRR, no rubs, murmurs or gallops  Pulm: clear to auscultation bilaterally, moving normal volumes of air  Abd: soft, nontender, BS present, +distention, +hepatomegaly  Ext: warm and well perfused, no pedal edema  Neuro: alert and oriented X3, moves all 4 extremities voluntarily, no asterixis   Lab Results: Basic Metabolic Panel:  Recent Labs Lab 03/07/13 0545 03/09/13 0633  NA 139 139  K 3.8 4.3  CL 109 106  CO2 18* 24  GLUCOSE 111* 112*  BUN 16 6  CREATININE 0.73 0.59  CALCIUM 8.1* 8.1*   Liver Function Tests:  Recent Labs Lab 03/05/13 1645 03/09/13 0633  AST 52* 50*  ALT 17 20  ALKPHOS 284* 264*  BILITOT 1.3* 1.3*  PROT 6.0 5.6*  ALBUMIN 2.1* 2.1*    Recent Labs Lab 03/05/13 1612  AMMONIA 35   CBC:  Recent Labs Lab 03/05/13 1645  03/09/13 1020 03/09/13 2239  WBC 5.2  < > 5.6 6.7  NEUTROABS 2.8  --   --   --     HGB 6.9*  < > 8.4* 8.0*  HCT 21.6*  < > 26.6* 24.7*  MCV 98.2  < > 97.1 97.6  PLT 158  < > 142* 147*  < > = values in this interval not displayed. Cardiac Enzymes:  Recent Labs Lab 03/05/13 2115 03/06/13 0120 03/06/13 0900  TROPONINI <0.30 <0.30 <0.30   CBG:  Recent Labs Lab 03/06/13 0826 03/07/13 0802 03/07/13 1227 03/07/13 1629 03/09/13 0754  GLUCAP 71 117* 104* 109* 136*   Coagulation:  Recent Labs Lab 03/05/13 1645 03/08/13 1040  LABPROT 21.9* 22.3*  INR 1.98* 2.03*   Urine Drug Screen: Drugs of Abuse     Component Value Date/Time   LABOPIA NONE DETECTED 01/31/2013 1030   COCAINSCRNUR NONE DETECTED 01/31/2013 1030   LABBENZ NONE DETECTED 01/31/2013 1030   AMPHETMU NONE DETECTED 01/31/2013 1030   THCU NONE DETECTED 01/31/2013 1030   LABBARB NONE DETECTED 01/31/2013 1030    Alcohol Level:  Recent Labs Lab 03/06/13 0900  ETH <11   Urinalysis:  Recent Labs Lab 03/06/13 0824  COLORURINE AMBER*  LABSPEC 1.025  PHURINE 5.5  GLUCOSEU NEGATIVE  HGBUR NEGATIVE  BILIRUBINUR SMALL*  KETONESUR NEGATIVE  PROTEINUR NEGATIVE  UROBILINOGEN 0.2  NITRITE NEGATIVE  LEUKOCYTESUR MODERATE*   Micro Results: Recent Results (from the past 240 hour(s))  MRSA PCR SCREENING     Status: None   Collection Time    03/05/13 10:40 PM      Result Value Ref Range Status   MRSA by PCR NEGATIVE  NEGATIVE Final   Comment:            The GeneXpert MRSA Assay (FDA     approved for NASAL specimens     only), is one component of a     comprehensive MRSA colonization     surveillance program. It is not     intended to diagnose MRSA     infection nor to guide or     monitor treatment for     MRSA infections.   Medications: I have reviewed the patient's current medications. Scheduled Meds: . buPROPion  150 mg Oral Daily  . ciprofloxacin  500 mg Oral BID  . folic acid  1 mg Oral Daily  . lactulose  20 g Oral BID  . multivitamin with minerals  1 tablet Oral Daily  .  pantoprazole (PROTONIX) IV  40 mg Intravenous Q24H  . rifaximin  550 mg Oral BID  . sodium chloride  3 mL Intravenous Q12H  . thiamine  100 mg Oral Daily  . ursodiol  300 mg Oral BID   Continuous Infusions:  PRN Meds:.ondansetron (ZOFRAN) IV, ondansetron, traMADol Assessment/Plan: 43 year old woman with a PMH of EtOH abuse, cirrhosis with esophageal varices who presents with complaint of hematemesis.   #UGIB - No hematemesis overnight. Hgb stable at 8.4 as of yesterday (she refused labs this morning) s/p 2U PRBCs on 3/4. Continues to tolerate a regular diet (2g Na) well.  - Appreciate GI recs  - Regular diet, 2g of sodium  - PRBCs to keep Hgb >7.0, per Dr. Bosie ClosSchooler would NOT transfuse if HCT is around 23-24  - Monitor CBC q12h  - Continue Cipro 500mg  BID, last dose tonight  - Will transition to Protonix PO BID prior to discharge  - Zofran prn nausea  - Tramadol q6h prn pain   #Hypotension - Improved, BP currently 100s-60s. Soft pressures at baseline. She is asymptomatic and not in hypovolemic shock. HR 60-70s (takes propranolol at home, but being held here). She is not actively bleeding, Hgb stable, and tolerating good po.  - D/c IVF  - Encourage po fluid intake  - Continue to monitor and fluid resuscitate as needed   #History of alcohol abuse - Last admission patient was admitted with an BAC = 0.21 and was put on a CIWA protocol. She denies using EtOH since then. Ethanol level <11 here. CIWAs have been 0-1.  - CIWA protocol  - Folate, MV, thiamine  - We discussed her drinking habits and recommended cessation, she is working towards a liver transplant   #Right hip pain - Patient reports fall on tailbone/right hip last Thursday. No issues with ambulation. Strength and sensation preserved. Negative SLR. XR bilateral hip and pelvis showed no acute bony abnormality.  - Heating pads prn  - Will recommend outpatient sports medicine vs. Orthopedics referral on discharge summary  -  Counseled the patient to avoid NSAIDs and Tylenol at home   #Cirrhosis - Ammonia 35 (wnl). She has evidence of liver synthetic deficits including PT/INR 22.9/2.03, thrombocytopenia (128).  - Continue lactulose, rifaximin, spironolactone, ursodiol   #Depression - Continue Wellbutrin   #VTE - SCDs  Dispo: Disposition is deferred at this time, awaiting improvement of current medical problems.  Anticipated discharge is today.    The patient does have a current PCP ([place], PA-C) and does not need an [facility] hospital follow-up appointment after discharge.  The patient does not have transportation limitations that hinder transportation to clinic appointments.  .Services Needed at time of discharge: Y = Yes, Blank = No PT:   OT:   RN:   Equipment: cane  Other:     LOS: 5 days   Ky Barban, MD 03/10/2013, 11:28 AM

## 2013-03-10 NOTE — Progress Notes (Signed)
03/10/13 1210 nsg Patient d/c to home significant other in room; CN instructed on discharge instructions and understood.

## 2013-03-11 NOTE — Discharge Summary (Signed)
  Date: 03/11/2013  Patient name: [name]  Medical record number: [id number]  Date of birth: 1970/04/10   This patient has been discussed with the house staff. Please see their note for complete details. I concur with their findings and plan.  Jonah BlueAlejandro Barclay Lennox, DO, FACP [facility] 03/11/2013, 11:49 AM

## 2013-04-06 ENCOUNTER — Encounter (HOSPITAL_COMMUNITY): Payer: Self-pay | Admitting: Emergency Medicine

## 2013-04-06 ENCOUNTER — Observation Stay (HOSPITAL_COMMUNITY)
Admission: EM | Admit: 2013-04-06 | Discharge: 2013-04-07 | Disposition: A | Discharge status: Home / Self Care | Payer: Medicaid Other | Attending: Internal Medicine | Admitting: Internal Medicine

## 2013-04-06 DIAGNOSIS — K7682 Hepatic encephalopathy: Principal | ICD-10-CM | POA: Diagnosis present

## 2013-04-06 DIAGNOSIS — Z91199 Patient's noncompliance with other medical treatment and regimen due to unspecified reason: Secondary | ICD-10-CM | POA: Insufficient documentation

## 2013-04-06 DIAGNOSIS — F17200 Nicotine dependence, unspecified, uncomplicated: Secondary | ICD-10-CM

## 2013-04-06 DIAGNOSIS — D649 Anemia, unspecified: Secondary | ICD-10-CM | POA: Diagnosis present

## 2013-04-06 DIAGNOSIS — R188 Other ascites: Secondary | ICD-10-CM

## 2013-04-06 DIAGNOSIS — R17 Unspecified jaundice: Secondary | ICD-10-CM | POA: Insufficient documentation

## 2013-04-06 DIAGNOSIS — K7290 Hepatic failure, unspecified without coma: Principal | ICD-10-CM | POA: Diagnosis present

## 2013-04-06 DIAGNOSIS — F1021 Alcohol dependence, in remission: Secondary | ICD-10-CM | POA: Insufficient documentation

## 2013-04-06 DIAGNOSIS — M549 Dorsalgia, unspecified: Secondary | ICD-10-CM | POA: Insufficient documentation

## 2013-04-06 DIAGNOSIS — R9431 Abnormal electrocardiogram [ECG] [EKG]: Secondary | ICD-10-CM | POA: Insufficient documentation

## 2013-04-06 DIAGNOSIS — F410 Panic disorder [episodic paroxysmal anxiety] without agoraphobia: Secondary | ICD-10-CM | POA: Insufficient documentation

## 2013-04-06 DIAGNOSIS — Z9119 Patient's noncompliance with other medical treatment and regimen: Secondary | ICD-10-CM | POA: Insufficient documentation

## 2013-04-06 DIAGNOSIS — D696 Thrombocytopenia, unspecified: Secondary | ICD-10-CM | POA: Diagnosis present

## 2013-04-06 DIAGNOSIS — K769 Liver disease, unspecified: Secondary | ICD-10-CM | POA: Insufficient documentation

## 2013-04-06 DIAGNOSIS — I8510 Secondary esophageal varices without bleeding: Secondary | ICD-10-CM

## 2013-04-06 DIAGNOSIS — I8500 Esophageal varices without bleeding: Secondary | ICD-10-CM | POA: Insufficient documentation

## 2013-04-06 DIAGNOSIS — G8929 Other chronic pain: Secondary | ICD-10-CM | POA: Insufficient documentation

## 2013-04-06 DIAGNOSIS — F1010 Alcohol abuse, uncomplicated: Secondary | ICD-10-CM

## 2013-04-06 DIAGNOSIS — G9340 Encephalopathy, unspecified: Secondary | ICD-10-CM

## 2013-04-06 DIAGNOSIS — K7030 Alcoholic cirrhosis of liver without ascites: Secondary | ICD-10-CM | POA: Insufficient documentation

## 2013-04-06 DIAGNOSIS — K7460 Unspecified cirrhosis of liver: Secondary | ICD-10-CM

## 2013-04-06 HISTORY — DX: Depression, unspecified: F32.A

## 2013-04-06 HISTORY — DX: Anemia, unspecified: D64.9

## 2013-04-06 HISTORY — DX: Major depressive disorder, single episode, unspecified: F32.9

## 2013-04-06 HISTORY — DX: Inflammatory liver disease, unspecified: K75.9

## 2013-04-06 LAB — CBC
HCT: 28.7 % — ABNORMAL LOW (ref 36.0–46.0)
Hemoglobin: 9.3 g/dL — ABNORMAL LOW (ref 12.0–15.0)
MCH: 31.1 pg (ref 26.0–34.0)
MCHC: 32.4 g/dL (ref 30.0–36.0)
MCV: 96 fL (ref 78.0–100.0)
Platelets: 115 10*3/uL — ABNORMAL LOW (ref 150–400)
RBC: 2.99 MIL/uL — AB (ref 3.87–5.11)
RDW: 21.2 % — ABNORMAL HIGH (ref 11.5–15.5)
WBC: 4.3 10*3/uL (ref 4.0–10.5)

## 2013-04-06 LAB — URINALYSIS, ROUTINE W REFLEX MICROSCOPIC
Glucose, UA: NEGATIVE mg/dL
Hgb urine dipstick: NEGATIVE
Ketones, ur: NEGATIVE mg/dL
Nitrite: NEGATIVE
Protein, ur: NEGATIVE mg/dL
Specific Gravity, Urine: 1.025 (ref 1.005–1.030)
UROBILINOGEN UA: 1 mg/dL (ref 0.0–1.0)
pH: 8 (ref 5.0–8.0)

## 2013-04-06 LAB — COMPREHENSIVE METABOLIC PANEL
ALT: 20 U/L (ref 0–35)
AST: 54 U/L — ABNORMAL HIGH (ref 0–37)
Albumin: 2.5 g/dL — ABNORMAL LOW (ref 3.5–5.2)
Alkaline Phosphatase: 348 U/L — ABNORMAL HIGH (ref 39–117)
BILIRUBIN TOTAL: 5.5 mg/dL — AB (ref 0.3–1.2)
BUN: 15 mg/dL (ref 6–23)
CHLORIDE: 101 meq/L (ref 96–112)
CO2: 26 meq/L (ref 19–32)
CREATININE: 0.62 mg/dL (ref 0.50–1.10)
Calcium: 8.7 mg/dL (ref 8.4–10.5)
GFR calc Af Amer: >90 mL/min (ref >90)
Glucose, Bld: 129 mg/dL — ABNORMAL HIGH (ref 70–99)
Potassium: 3.7 mEq/L (ref 3.7–5.3)
Sodium: 139 mEq/L (ref 137–147)
Total Protein: 6.8 g/dL (ref 6.0–8.3)

## 2013-04-06 LAB — APTT: aPTT: 40 seconds — ABNORMAL HIGH (ref 24–37)

## 2013-04-06 LAB — AMMONIA: AMMONIA: 109 umol/L — AB (ref 11–60)

## 2013-04-06 LAB — PROTIME-INR
INR: 1.78 — ABNORMAL HIGH (ref 0.00–1.49)
Prothrombin Time: 20.2 seconds — ABNORMAL HIGH (ref 11.6–15.2)

## 2013-04-06 LAB — URINE MICROSCOPIC-ADD ON

## 2013-04-06 LAB — CBG MONITORING, ED: Glucose-Capillary: 109 mg/dL — ABNORMAL HIGH (ref 70–99)

## 2013-04-06 LAB — ETHANOL: Alcohol, Ethyl (B): <11 mg/dL (ref 0–11)

## 2013-04-06 LAB — TROPONIN I: Troponin I: <0.3 ng/mL (ref <0.30)

## 2013-04-06 MED ORDER — SODIUM CHLORIDE 0.9 % IV BOLUS (SEPSIS)
1000.0000 mL | Freq: Once | INTRAVENOUS | Status: AC
  Administered 2013-04-06: 1000 mL via INTRAVENOUS

## 2013-04-06 MED ORDER — FUROSEMIDE 20 MG PO TABS
20.0000 mg | ORAL_TABLET | Freq: Every day | ORAL | Status: DC
  Administered 2013-04-06 – 2013-04-07 (×2): 20 mg via ORAL
  Filled 2013-04-06 (×2): qty 1

## 2013-04-06 MED ORDER — CIPROFLOXACIN HCL 500 MG PO TABS
500.0000 mg | ORAL_TABLET | Freq: Every evening | ORAL | Status: DC
  Administered 2013-04-06: 500 mg via ORAL
  Filled 2013-04-06 (×2): qty 1

## 2013-04-06 MED ORDER — BUPROPION HCL ER (XL) 150 MG PO TB24
150.0000 mg | ORAL_TABLET | Freq: Every day | ORAL | Status: DC
  Administered 2013-04-06 – 2013-04-07 (×2): 150 mg via ORAL
  Filled 2013-04-06 (×2): qty 1

## 2013-04-06 MED ORDER — PANTOPRAZOLE SODIUM 40 MG PO TBEC
40.0000 mg | DELAYED_RELEASE_TABLET | Freq: Two times a day (BID) | ORAL | Status: DC
  Administered 2013-04-06 – 2013-04-07 (×2): 40 mg via ORAL
  Filled 2013-04-06 (×3): qty 1

## 2013-04-06 MED ORDER — PROMETHAZINE HCL 25 MG/ML IJ SOLN
12.5000 mg | INTRAMUSCULAR | Status: DC | PRN
  Administered 2013-04-07: 25 mg via INTRAVENOUS
  Filled 2013-04-06: qty 1

## 2013-04-06 MED ORDER — ONDANSETRON HCL 4 MG/2ML IJ SOLN
4.0000 mg | Freq: Once | INTRAMUSCULAR | Status: AC
  Administered 2013-04-06: 4 mg via INTRAVENOUS
  Filled 2013-04-06: qty 2

## 2013-04-06 MED ORDER — SPIRONOLACTONE 100 MG PO TABS
100.0000 mg | ORAL_TABLET | Freq: Every morning | ORAL | Status: DC
  Administered 2013-04-07: 100 mg via ORAL
  Filled 2013-04-06: qty 1

## 2013-04-06 MED ORDER — SODIUM CHLORIDE 0.9 % IV SOLN
INTRAVENOUS | Status: AC
  Administered 2013-04-06: 18:00:00 via INTRAVENOUS

## 2013-04-06 MED ORDER — LACTULOSE 10 GM/15ML PO SOLN
20.0000 g | Freq: Two times a day (BID) | ORAL | Status: DC
  Administered 2013-04-07: 20 g via ORAL
  Filled 2013-04-06 (×2): qty 30

## 2013-04-06 MED ORDER — LACTULOSE 10 GM/15ML PO SOLN
30.0000 g | Freq: Once | ORAL | Status: AC
  Administered 2013-04-06: 30 g via ORAL
  Filled 2013-04-06: qty 45

## 2013-04-06 MED ORDER — URSODIOL 300 MG PO CAPS
300.0000 mg | ORAL_CAPSULE | Freq: Two times a day (BID) | ORAL | Status: DC
  Administered 2013-04-06 – 2013-04-07 (×2): 300 mg via ORAL
  Filled 2013-04-06 (×3): qty 1

## 2013-04-06 MED ORDER — SODIUM CHLORIDE 0.9 % IV BOLUS (SEPSIS)
250.0000 mL | Freq: Once | INTRAVENOUS | Status: AC
  Administered 2013-04-06: 250 mL via INTRAVENOUS

## 2013-04-06 MED ORDER — ADULT MULTIVITAMIN W/MINERALS CH
1.0000 | ORAL_TABLET | Freq: Every day | ORAL | Status: DC
  Administered 2013-04-07: 1 via ORAL
  Filled 2013-04-06: qty 1

## 2013-04-06 MED ORDER — ONDANSETRON HCL 4 MG PO TABS: 4.0000 mg | ORAL_TABLET | Freq: Four times a day (QID) | ORAL | Status: DC | PRN

## 2013-04-06 MED ORDER — RIFAXIMIN 550 MG PO TABS
550.0000 mg | ORAL_TABLET | Freq: Two times a day (BID) | ORAL | Status: DC
  Administered 2013-04-06 – 2013-04-07 (×2): 550 mg via ORAL
  Filled 2013-04-06 (×3): qty 1

## 2013-04-06 MED ORDER — LACTULOSE 10 GM/15ML PO SOLN
20.0000 g | Freq: Two times a day (BID) | ORAL | Status: DC
  Filled 2013-04-06: qty 30

## 2013-04-06 MED ORDER — FOLIC ACID 800 MCG PO TABS: 800.0000 ug | ORAL_TABLET | Freq: Every day | ORAL | Status: DC

## 2013-04-06 MED ORDER — VITAMIN B-1 100 MG PO TABS
100.0000 mg | ORAL_TABLET | Freq: Every day | ORAL | Status: DC
  Administered 2013-04-06 – 2013-04-07 (×2): 100 mg via ORAL
  Filled 2013-04-06 (×2): qty 1

## 2013-04-06 MED ORDER — FOLIC ACID 0.5 MG HALF TAB
0.5000 mg | ORAL_TABLET | Freq: Every day | ORAL | Status: DC
  Administered 2013-04-07: 0.5 mg via ORAL
  Filled 2013-04-06: qty 1

## 2013-04-06 MED ORDER — FENTANYL CITRATE 0.05 MG/ML IJ SOLN
50.0000 ug | Freq: Once | INTRAMUSCULAR | Status: AC
  Administered 2013-04-06: 50 ug via INTRAVENOUS
  Filled 2013-04-06: qty 2

## 2013-04-06 MED ORDER — ONDANSETRON HCL 4 MG/2ML IJ SOLN: 4.0000 mg | Freq: Four times a day (QID) | INTRAMUSCULAR | Status: DC | PRN

## 2013-04-06 MED ORDER — TRAMADOL HCL 50 MG PO TABS
50.0000 mg | ORAL_TABLET | Freq: Two times a day (BID) | ORAL | Status: DC
  Administered 2013-04-06 – 2013-04-07 (×2): 50 mg via ORAL
  Filled 2013-04-06 (×2): qty 1

## 2013-04-06 MED ORDER — WOMENS ONE DAILY PO TABS: 1.0000 | ORAL_TABLET | Freq: Every day | ORAL | Status: DC

## 2013-04-06 NOTE — ED Notes (Signed)
MD at bedside. 

## 2013-04-06 NOTE — ED Notes (Addendum)
Pt reports confusion and slurred speech noted 1100 today by family member when they came home. Pt Last seen normal last night. Pt reports similar symptoms in past and was due to elevated ammonia level.

## 2013-04-06 NOTE — ED Provider Notes (Signed)
CSN: [id number]     Arrival date & time 04/06/13  1239 History   First MD Initiated Contact with Patient 04/06/13 1406     Chief Complaint  Patient presents with  . Altered Mental Status     (Consider location/radiation/quality/duration/timing/severity/associated sxs/prior Treatment) Patient is a 43 y.o. female presenting with altered mental status.  Altered Mental Status  Level 5 caveat due to confusion Pt with history of EtOH and cirrhosis brought to the ED by family for increased confusion since yesterday. She has had the same before with high ammonia. Has been taking lactulose per her usual dose.Denies any fever, CP SOB or cough. She has had some dry heaves since yesterday but no bloody emesis and no bloody or melanic stools.   Past Medical History  Diagnosis Date  . Sclerosing cholangitis   . Panic attacks   . Anxiety   . Alcohol abuse   . Ascites   . Cirrhosis of liver   . EtOH dependence 08/31/2012  . Family history of anesthesia complication     " my son had problems "   Past Surgical History  Procedure Laterality Date  . None    . Ercp    . Esophagogastroduodenoscopy N/A 09/10/2012    Procedure: ESOPHAGOGASTRODUODENOSCOPY (EGD);  Surgeon: Florencia Reasonsobert V Buccini, MD;  [facility];  Service: Endoscopy;  Laterality: N/A;  . Esophagogastroduodenoscopy N/A 11/04/2012    Procedure: ESOPHAGOGASTRODUODENOSCOPY (EGD);  Surgeon: Barrie FolkJohn C Hayes, MD;  [facility];  Service: Endoscopy;  Laterality: N/A;  . Esophagogastroduodenoscopy N/A 03/06/2013    Procedure: ESOPHAGOGASTRODUODENOSCOPY (EGD);  Surgeon: Shirley FriarVincent C. Schooler, MD;  [facility];  Service: Endoscopy;  Laterality: N/A;  [place]   Family History  Problem Relation Age of Onset  . CAD Father    History  Substance Use Topics  . Smoking status: Current Every Day Smoker -- 0.50 packs/day for 20 years    Types: Cigarettes  . Smokeless tobacco: Never Used  . Alcohol Use: No     Comment: Pt drinks  vodka/liquor every weekend, denies daily drinking...states she quit a month ago....   OB History   Grav Para Term Preterm Abortions TAB SAB Ect Mult Living                 Review of Systems  Unable to assess due to mental status.    Allergies  Review of patient's allergies indicates no known allergies.  Home Medications   Current Outpatient Rx  Name  Route  Sig  Dispense  Refill  . buPROPion (WELLBUTRIN XL) 150 MG 24 hr tablet   Oral   Take 150 mg by mouth daily.         . ciprofloxacin (CIPRO) 500 MG tablet   Oral   Take 1 tablet (500 mg total) by mouth every evening.   1 tablet   0   . folic acid (FOLVITE) 800 MCG tablet   Oral   Take 800 mcg by mouth daily.         . furosemide (LASIX) 20 MG tablet   Oral   Take 1 tablet (20 mg total) by mouth 2 (two) times daily.   20 tablet   0   . lactulose (CHRONULAC) 10 GM/15ML solution   Oral   Take 20 g by mouth 2 (two) times daily.         . Multiple Vitamins-Minerals (WOMENS ONE DAILY) TABS   Oral   Take 1 tablet by mouth daily.         .Marland Kitchen  pantoprazole (PROTONIX) 40 MG tablet   Oral   Take 40 mg by mouth 2 (two) times daily.         . potassium chloride 20 MEQ/15ML (10%) solution   Oral   Take 20 mEq by mouth 2 (two) times daily.         . propranolol (INDERAL) 20 MG tablet   Oral   Take 20 mg by mouth daily.         . rifaximin (XIFAXAN) 550 MG TABS tablet   Oral   Take 550 mg by mouth 2 (two) times daily.         Marland Kitchen spironolactone (ALDACTONE) 100 MG tablet   Oral   Take 100 mg by mouth daily.         Marland Kitchen thiamine (VITAMIN B-1) 100 MG tablet   Oral   Take 100 mg by mouth daily.         . traMADol (ULTRAM) 50 MG tablet   Oral   Take 1 tablet (50 mg total) by mouth every 6 (six) hours as needed for moderate pain.   28 tablet   0   . ursodiol (ACTIGALL) 300 MG capsule   Oral   Take 300 mg by mouth 2 (two) times daily.          BP 100/48  Pulse 74  Temp(Src) 98.3 F (36.8  C) (Oral)  Resp 14  Ht 5\' 4"  (1.626 m)  Wt 168 lb (76.204 kg)  BMI 28.82 kg/m2  SpO2 100% Physical Exam  Nursing note and vitals reviewed. Constitutional: She appears well-developed and well-nourished.  HENT:  Head: Normocephalic and atraumatic.  Eyes: EOM are normal. Pupils are equal, round, and reactive to light.  Neck: Normal range of motion. Neck supple.  Cardiovascular: Normal rate, normal heart sounds and intact distal pulses.   Pulmonary/Chest: Effort normal and breath sounds normal.  Abdominal: Bowel sounds are normal. She exhibits no distension. There is no tenderness. There is no rebound and no guarding.  Musculoskeletal: Normal range of motion. She exhibits no edema and no tenderness.  Neurological: She is alert. She has normal strength. No cranial nerve deficit or sensory deficit.  Skin: Skin is warm and dry. No rash noted.  Multiple spider hemangiomas  Psychiatric: She has a normal mood and affect.    ED Course  Procedures (including critical care time) Labs Review Labs Reviewed  CBC  COMPREHENSIVE METABOLIC PANEL  URINALYSIS, ROUTINE W REFLEX MICROSCOPIC  AMMONIA  PROTIME-INR  APTT  TROPONIN I  CBG MONITORING, ED   Imaging Review No results found.   EKG Interpretation   Date/Time:  Saturday April 06 2013 13:01:45 EDT Ventricular Rate:  78 PR Interval:  140 QRS Duration: 82 QT Interval:  440 QTC Calculation: 501 R Axis:   76 Text Interpretation:  Normal sinus rhythm Cannot rule out Anterior infarct  , age undetermined Prolonged QT Abnormal ECG Since last tracing T wave  inversion V2 now present Confirmed by [facility]  MD, Leonette Most [phone]) on  04/06/2013 2:16:55 PM      MDM   Final diagnoses:  Hepatic encephalopathy   Hepatic encephalopathy. Admit for lactulose and further eval. Was previously on [facility] service.     Alekxander Isola B. Bernette Mayers, MD 04/06/13 [phone]

## 2013-04-06 NOTE — H&P (Signed)
Date: 04/06/2013               Patient Name:  [name] MRN: [id number]  DOB: 1970/10/26 Age / Sex: 43 y.o., female   PCP: [name], PA-C         Medical Service: Internal Medicine Teaching Service         Attending Physician: Dr. Madilyn Fireman, MD    First Contact: Dr. Mechele Claude Pager: [phone]  Second Contact: Dr. Eulas Post Pager: [phone]       After Hours (After 5p/  First Contact Pager: [phone]  weekends / holidays): Second Contact Pager: [phone]   Chief Complaint: AMS  History of Present Illness:  This is a 43yo woman w/ PMH alcohol abuse, cirrhosis w/ ascites and esophageal varices (s/p multiple banding procedures last was done 03/2013 for UGI bleed), PSC (MRCP 03/2009) who presents with AMS. Per family, patient has been sleeping more than usual and has been more confused over the last two days. Patient was given lactulose 30g PO in the ED and per family, she is close to her baseline, but not quite. Patient has h/o multiple admissions for similar episodes of AMS after lactulose noncompliance. This admission, she reports compliance with lactulose 20g PO each evening (lactulose supposed to be BID, but she notes Dr. Michail Sermon said if once daily "is working" she can do that). Patient endorses having some lower abd cramping and nausea with dry heaving x 2 days. Denies F/C, dysuria, bloody stools, hematemesis, melena, CP, SOB. Of note, patient is still in rehab for alcohol abuse since 11/2012. She admits to having one "slip up" when she drank some alcohol when she was admitted back in February, but otherwise no alcohol since 11/07/2012. Patient is unable to list her medications and is unsure if she is taking cipro and rifamixin currently. She will have her boyfriend bring her medications to the hospital tomorrow.  Patient was most recently hospitalized twice (once in February and once in March) for bleeding esophageal varices. Dr. Michail Sermon banded bleeding varix during hospitalization in  March. No concern for bleeding at this time. Hb stable at 9.3 (last Hb 8.0 3/7).   In the ED, patient's blood pressure was soft 104/51, though per chart review this is her baseline. She was afebrile and the rest of her vitals stable. Ammonia level found to be 109, Total bili elevated 5.5, alk phos elevated 349. Patient with chronic thrombocytopenia, though PLT found to be somewhat lower than last (115 today and 147 on 3/7).   Meds: Medications Prior to Admission  Medication Sig Dispense Refill  . buPROPion (WELLBUTRIN XL) 150 MG 24 hr tablet Take 150 mg by mouth daily.      . ciprofloxacin (CIPRO) 500 MG tablet Take 1 tablet (500 mg total) by mouth every evening.  1 tablet  0  . folic acid (FOLVITE) 007 MCG tablet Take 800 mcg by mouth daily.      . furosemide (LASIX) 20 MG tablet Take 20 mg by mouth daily.      Marland Kitchen lactulose (CHRONULAC) 10 GM/15ML solution Take 20 g by mouth every evening.       . Multiple Vitamins-Minerals (WOMENS ONE DAILY) TABS Take 1 tablet by mouth daily.      . naproxen sodium (ANAPROX) 220 MG tablet Take 220 mg by mouth daily as needed (for pain).      . pantoprazole (PROTONIX) 40 MG tablet Take 40 mg by mouth 2 (two) times daily.      Marland Kitchen  potassium chloride 20 MEQ/15ML (10%) solution Take 20 mEq by mouth daily.       . propranolol (INDERAL) 20 MG tablet Take 20 mg by mouth every morning.       . rifaximin (XIFAXAN) 550 MG TABS tablet Take 550 mg by mouth 2 (two) times daily.      Marland Kitchen spironolactone (ALDACTONE) 100 MG tablet Take 100 mg by mouth every morning.       . thiamine (VITAMIN B-1) 100 MG tablet Take 100 mg by mouth daily.      . ursodiol (ACTIGALL) 300 MG capsule Take 300 mg by mouth 2 (two) times daily.        Allergies: Allergies as of 04/06/2013  . (No Known Allergies)   Past Medical History  Diagnosis Date  . Sclerosing cholangitis   . Panic attacks   . Anxiety   . Alcohol abuse   . Ascites   . Cirrhosis of liver   . EtOH dependence 08/31/2012  .  Family history of anesthesia complication     " my son had problems "   Past Surgical History  Procedure Laterality Date  . None    . Ercp    . Esophagogastroduodenoscopy N/A 09/10/2012    Procedure: ESOPHAGOGASTRODUODENOSCOPY (EGD);  Surgeon: Cleotis Nipper, MD;  Location[facility] ENDOSCOPY;  Service: Endoscopy;  Laterality: N/A;  . Esophagogastroduodenoscopy N/A 11/04/2012    Procedure: ESOPHAGOGASTRODUODENOSCOPY (EGD);  Surgeon: Missy Sabins, MD;  Location[facility] ENDOSCOPY;  Service: Endoscopy;  Laterality: N/A;  . Esophagogastroduodenoscopy N/A 03/06/2013    Procedure: ESOPHAGOGASTRODUODENOSCOPY (EGD);  Surgeon: Lear Ng, MD;  Location: [facility] ENDOSCOPY;  Service: Endoscopy;  Laterality: N/A;  [place]   Family History  Problem Relation Age of Onset  . CAD Father    History   Social History  . Marital Status: Single    Spouse Name: N/A    Number of Children: N/A  . Years of Education: N/A   Occupational History  . Not on file.   Social History Main Topics  . Smoking status: Current Every Day Smoker -- 0.50 packs/day for 20 years    Types: Cigarettes  . Smokeless tobacco: Never Used  . Alcohol Use: No     Comment: Pt drinks vodka/liquor every weekend, denies daily drinking...states she quit a month ago....  . Drug Use: Yes    Special: Marijuana     Comment: PT STATES SHE WENT THROUGH ETOH/DRUG PROGRAM AND DOES NOT DO EITHER ANYMORE  . Sexual Activity: Yes    Birth Control/ Protection: IUD   Other Topics Concern  . Not on file   Social History Narrative  . No narrative on file    Review of Systems: General: no fevers, chills, changes in appetite Skin: no rash HEENT: no HA, ST Pulm: no dyspnea, coughing CV: no chest pain, palpitations, shortness of breath Abd: see HPI GU: no dysuria, hematuria, polyuria Ext: no arthralgias, myalgias; she has chronic back pain Neuro: no weakness, numbness, or tingling  Physical Exam: Blood pressure 93/47, pulse 74, temperature  98.3 F (36.8 C), temperature source Oral, resp. rate 10, height _0  (1.626 m), weight 76.204 kg (168 lb), SpO2 100.00%. General: alert, cooperative, and in no apparent distress; sitting upright in bed answering questions appropriately HEENT: pupils equal round and reactive to light, vision grossly intact, mild scleral icterus; oropharynx clear and non-erythematous, no visible sublingual icterus Neck: supple Lungs: clear to ascultation bilaterally, normal work of respiration, no wheezes, rales, ronchi Heart: regular  rate and rhythm, no murmurs, gallops, or rubs Abdomen: soft, somewhat distended with visible blood vessels to epigastrium; +fluid wave; mildly TTP in suprapubic region Extremities: warm, no pedal edema Neurologic: alert & oriented X3, cranial nerves II-XII grossly intact, strength grossly intact, sensation intact to light touch; +asterixis   Lab results: Basic Metabolic Panel:  Recent Labs  04/06/13 1431  NA 139  K 3.7  CL 101  CO2 26  GLUCOSE 129*  BUN 15  CREATININE 0.62  CALCIUM 8.7   Liver Function Tests:  Recent Labs  04/06/13 1431  AST 54*  ALT 20  ALKPHOS 348*  BILITOT 5.5*  PROT 6.8  ALBUMIN 2.5*    Recent Labs  04/06/13 1431  AMMONIA 109*   CBC:  Recent Labs  04/06/13 1431  WBC 4.3  HGB 9.3*  HCT 28.7*  MCV 96.0  PLT 115*   Cardiac Enzymes:  Recent Labs  04/06/13 1431  TROPONINI <0.30   CBG:  Recent Labs  04/06/13 1511  GLUCAP 109*    ETOH level <11   Coagulation:  Recent Labs  04/06/13 1431  LABPROT 20.2*  INR 1.78*   Urine Drug Screen: Drugs of Abuse     Component Value Date/Time   LABOPIA NONE DETECTED 01/31/2013 1030   COCAINSCRNUR NONE DETECTED 01/31/2013 1030   LABBENZ NONE DETECTED 01/31/2013 1030   AMPHETMU NONE DETECTED 01/31/2013 1030   THCU NONE DETECTED 01/31/2013 1030   LABBARB NONE DETECTED 01/31/2013 1030   Urinalysis:  Recent Labs  04/06/13 1640  COLORURINE ORANGE*  LABSPEC 1.025    PHURINE 8.0  GLUCOSEU NEGATIVE  HGBUR NEGATIVE  BILIRUBINUR SMALL*  KETONESUR NEGATIVE  PROTEINUR NEGATIVE  UROBILINOGEN 1.0  NITRITE NEGATIVE  LEUKOCYTESUR SMALL*    Imaging results:  No results found.  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 Patient's mental status is clearing up s/p lactulose 30g PO given in the ED. VSS, though blood pressure is low this is her baseline. Patient has been taking lactulose 20g once daily, supposed to be taking it BID. + Asterixis on exam. No concern for GI bleed at this time as Hb stable and she has not had any hematemesis or bloody stool. Patient does have distended abdomen with ?fluid wave and lower abd pain, therefore SBP is a concern. No other source of infection suspected, UA negative.  -admit to telemetry  -lactulose 30g again at 10PM -CBC, CMP, PT/INR in AM -continue home rifaximin 515m BID, protonix 429mBID, cipro 50056mHS, spironolactone100m35mily, lasix 20mg44mly -hold propranolol given soft BP -tramadol for pain prn  -f/u BCX x 2 -abd [place] for ascites - will tap if significant amount of fluid   # Alcohol abuse: In outpatient rehab since 11/2012. Patient denies recent use of any alcohol, last drink in February and before that last drink was in November prior to rehab. ETOH negative.  -CIWA to be safe  # EKG changes: There are some T wave changes to anterior leads that are new from prior. Patient has no CP or SOB. However, given AMS we will evaluate by cycling troponin and obtaining AM EKG. -EKG in AM -cycle trops  # Tobacco abuse: Patient still smoking about 1ppd. Says  the Wellbutrin has helped her cut back.  -continue  Wellbutrin  # VTE: SCD given recent GI bleed  # Diet:   Code status: full   Dispo: Disposition is deferred at this time, awaiting improvement of current medical problems. Anticipated discharge in approximately 2 day(s).   The patient does have a current PCP ([place], PA-C) and does not need an [facility] hospital follow-up appointment after discharge.  The patient does not have transportation limitations that hinder transportation to clinic appointments.  Signed: Rebecca Eaton, MD 04/06/2013, 6:53 PM

## 2013-04-06 NOTE — ED Notes (Signed)
MD at bedside. To discuss Admisson

## 2013-04-06 NOTE — ED Notes (Signed)
Attempted Report 

## 2013-04-06 NOTE — ED Notes (Signed)
CBG 109 

## 2013-04-06 NOTE — Progress Notes (Signed)
Patient arrived to [id number]. Patient stable and oriented to room.

## 2013-04-07 ENCOUNTER — Observation Stay (HOSPITAL_COMMUNITY): Payer: Medicaid Other

## 2013-04-07 LAB — COMPREHENSIVE METABOLIC PANEL
ALK PHOS: 310 U/L — AB (ref 39–117)
ALT: 19 U/L (ref 0–35)
AST: 56 U/L — ABNORMAL HIGH (ref 0–37)
Albumin: 2.3 g/dL — ABNORMAL LOW (ref 3.5–5.2)
BILIRUBIN TOTAL: 5.4 mg/dL — AB (ref 0.3–1.2)
BUN: 13 mg/dL (ref 6–23)
CHLORIDE: 105 meq/L (ref 96–112)
CO2: 24 mEq/L (ref 19–32)
Calcium: 9 mg/dL (ref 8.4–10.5)
Creatinine, Ser: 0.64 mg/dL (ref 0.50–1.10)
GFR calc non Af Amer: >90 mL/min (ref >90)
GLUCOSE: 113 mg/dL — AB (ref 70–99)
POTASSIUM: 4 meq/L (ref 3.7–5.3)
Sodium: 141 mEq/L (ref 137–147)
Total Protein: 6.3 g/dL (ref 6.0–8.3)

## 2013-04-07 LAB — CBC
HCT: 26.8 % — ABNORMAL LOW (ref 36.0–46.0)
Hemoglobin: 8.5 g/dL — ABNORMAL LOW (ref 12.0–15.0)
MCH: 31 pg (ref 26.0–34.0)
MCHC: 31.7 g/dL (ref 30.0–36.0)
MCV: 97.8 fL (ref 78.0–100.0)
PLATELETS: 108 10*3/uL — AB (ref 150–400)
RBC: 2.74 MIL/uL — ABNORMAL LOW (ref 3.87–5.11)
RDW: 21.4 % — AB (ref 11.5–15.5)
WBC: 4.2 10*3/uL (ref 4.0–10.5)

## 2013-04-07 LAB — PROTIME-INR
INR: 1.82 — AB (ref 0.00–1.49)
Prothrombin Time: 20.5 seconds — ABNORMAL HIGH (ref 11.6–15.2)

## 2013-04-07 MED ORDER — NAPROXEN SODIUM 275 MG PO TABS: 275.0000 mg | ORAL_TABLET | Freq: Two times a day (BID) | ORAL | Status: DC

## 2013-04-07 MED ORDER — NAPROXEN 250 MG PO TABS
250.0000 mg | ORAL_TABLET | Freq: Two times a day (BID) | ORAL | Status: DC
  Filled 2013-04-07 (×2): qty 1

## 2013-04-07 MED ORDER — LACTULOSE 10 GM/15ML PO SOLN: 20.0000 g | Freq: Two times a day (BID) | ORAL | Status: DC

## 2013-04-07 NOTE — Discharge Instructions (Signed)
YOU NEED TO HAVE YOUR CMP AND CBC RECHECKED THIS WEEK AT YOUR PCP'S OFFICE.  FOLLOW UP WITH DR. Bosie ClosSCHOOLER IN 2-3 WEEKS. PLEASE CALL HIS OFFICE TO MAKE THIS APPOINTMENT.  PLEASE TAKE LACTULOSE 20G TWICE PER DAY. DO NOT SKIP DOSES.  Hepatic Encephalopathy Hepatic encephalopathy is a syndrome. This is a set of symptoms that occur together. It is seen mostly in patients with damage to the liver known as cirrhosis. This is where normal liver tissue has been replaced by scar tissue.  Symptoms of the syndrome include:  Changes in personality.  Mental impairment.  A depressed level of consciousness. These changes occur because toxins build up in the bloodstream. The build up occurs because the scarred liver cannot rid toxins from the body. The most important of these toxins is ammonia. Toxins can cause abnormal behavior and confusion. Toxins in the blood stream can impair your ability to take care of yourself or others. Some people become very sleepy and cannot be woken easily. In severe cases, the patient lapses into a coma.  CAUSES  There are many things that can cause liver damage that can lead to buildup of toxins. These include:  Diseases that cause cirrhosis of the liver.  Long-term alcohol use with progressive liver damage.  Hepatitis B or C with ongoing infection and liver damage.  Patients without cirrhosis who have undergone shunt surgery.  Kidney failure.  Bleeding in the stomach or intestines.  Infection.  Constipation.  Medications that act upon the central nervous system.  Diuretic therapy.  Excessive dietary protein. SYMPTOMS  Symptoms of this syndrome are categorized or "staged" based on severity.   Stage 0. Minimal hepatic encephalopathy. No detectable changes in personality or behavior. Minimal changes in memory, concentration, mental function, and physical ability.  Stage 1. Some lack of awareness. Shortened attention span. Problems with addition or subtraction.  Possible problems with sleeping or a reversal of the normal sleep pattern. Euphoria, depression, or irritability may be present. Mild confusion. Slowing of mental ability. Tremors may be detected.  Stage 2. Lethargy or apathy. Disoriented. Strange behavior. Slurred speech. Obvious tremors. Drowsiness, unable to perform mental tasks. Personality changes, and confusion about time.  Stage 3. Very sleepy but can be aroused. Unable to perform mental tasks, cannot keep track of time and place, marked confusion, amnesia, occasional fits of rage, speech cannot be understood.  Stage 4. Coma with or without response to painful stimuli. DIAGNOSIS  In mild cases, a careful history and physical exam may lead your caregiver to consider possible mild hepatic encephalopathy as the cause of symptoms. The diagnosis is clearer in more severe cases. An elevated blood ammonia level is the classic blood test abnormality in patients with this syndrome. Other tests can be helpful to rule out other diseases.  TREATMENT   Medications are often used to lower the ammonia level in the blood. This usually leads to improvement.  Diets containing vegetable proteins are better than diets rich in animal protein, especially proteins derived from red meats. Eating well-cooked chicken and fish in addition to vegetable protein should be discussed with your caregiver. Malnourished patients are encouraged to add liquid nutritional supplements to their diet.  Antibiotics are sometimes used to try to lessen the volume of bacteria in the intestines that produce ammonia.  Moderate to severe cases of this syndrome usually require a hospital stay and medicine that is given directly into a vein (intravenously). HOME CARE INSTRUCTIONS  The goal at home is to avoid things that  can make the condition worse and lead to a buildup of ammonia in the blood.  Eat a well balanced diet. Your caregiver can help you with suggestions on this.  Talk to  your caregiver before taking vitamin supplements. Large doses of vitamins and minerals, especially vitamin A, iron, or copper, can worsen liver damage.  A low salt diet, water restriction, or diuretic medicine may be needed to reduce fluid retention.  Avoid alcohol and acetaminophen as well as any over-the-counter medications that contain acetaminophen (check labels). Only take over-the-counter or prescription medicines for pain, discomfort, or fever as directed by your caregiver.  Avoid drugs that are toxic to the liver. Review your medications (both prescription and non-prescription) with your caregiver to make sure those you are taking will not be harmful.  Blood tests may be needed. Follow your caregiver's advice regarding the timing of these.  With this condition you play a critical role in maintaining your own good health. The failure to follow your caregiver's advice and these instructions may result in permanent disability or death. SEEK MEDICAL CARE IF:   You have increasing fatigue or weakness.  You develop increasing swelling of the abdomen, hands, feet, legs or face.  You develop loss of appetite.  You are feeling sick to your stomach (nausea) and vomiting.  You develop jaundice. This is a yellow discoloration of the skin.  You develop worsening problems with concentration, confusion, and/or problems with sleep. SEEK IMMEDIATE MEDICAL CARE IF:   You vomit bright red blood or a coffee ground-looking material.  You have blood in your stools. Or the stools turn black and tarry.  You have a fever.  You develop easy bruising or bleeding.  You have a return of slurred speech, change in behavior, or confusion. MAKE SURE YOU:   Understand these instructions.  Will watch your condition.  Will get help right away if you are not doing well or get worse. Document Released: 03/01/2006 Document Revised: 03/14/2011 Document Reviewed: 12/06/2006 [facility] Patient Information  2014 [place], [place].

## 2013-04-07 NOTE — Progress Notes (Signed)
Subjective: Patient feels well this morning. No abd pain. She had mild nausea last night, though this has resolved. I verified her home medications with her medication list that patient's boyfriend brought in from home today. Cipro is not on this list, otherwise our medications listed here match up. Patient does have some chronic back pain and is requesting something for the pain.   Objective: Vital signs in last 24 hours: Filed Vitals:   04/06/13 1700 04/06/13 2100 04/07/13 0600 04/07/13 0900  BP: 117/73 109/64 98/58 101/67  Pulse: 68 67 82 65  Temp: 98.1 F (36.7 C) 98.5 F (36.9 C) 98.9 F (37.2 C) 98.4 F (36.9 C)  TempSrc:  Oral Oral Oral  Resp: 17 18 16 18   Height:      Weight:      SpO2: 100% 100% 98% 96%   Weight change:   Intake/Output Summary (Last 24 hours) at 04/07/13 1215 Last data filed at 04/07/13 0900  Gross per 24 hour  Intake      0 ml  Output      0 ml  Net      0 ml   Physical Exam General: alert, cooperative, and in no apparent distress; sitting upright in bed answering questions appropriately HEENT: pupils equal round and reactive to light, vision grossly intact, mild scleral icterus; oropharynx clear and non-erythematous, MMM, no visible sublingual icterus Neck: supple Lungs: clear to ascultation bilaterally, normal work of respiration, no wheezes, rales, ronchi Heart: regular rate and rhythm, no murmurs, gallops, or rubs Abdomen: soft, somewhat distended with visible blood vessels to epigastrium; no TTP today Extremities: warm, no pedal edema Neurologic: alert & oriented X3, cranial nerves II-XII grossly intact, strength grossly intact, sensation intact to light touch, no asterixis   Lab Results: Basic Metabolic Panel:  Recent Labs Lab 04/06/13 1431 04/07/13 1045  NA 139 141  K 3.7 4.0  CL 101 105  CO2 26 24  GLUCOSE 129* 113*  BUN 15 13  CREATININE 0.62 0.64  CALCIUM 8.7 9.0   Liver Function Tests:  Recent Labs Lab 04/06/13 1431  04/07/13 1045  AST 54* 56*  ALT 20 19  ALKPHOS 348* 310*  BILITOT 5.5* 5.4*  PROT 6.8 6.3  ALBUMIN 2.5* 2.3*    Recent Labs Lab 04/06/13 1431  AMMONIA 109*   CBC:  Recent Labs Lab 04/06/13 1431 04/07/13 1045  WBC 4.3 4.2  HGB 9.3* 8.5*  HCT 28.7* 26.8*  MCV 96.0 97.8  PLT 115* 108*   Cardiac Enzymes:  Recent Labs Lab 04/06/13 1431  TROPONINI <0.30   CBG:  Recent Labs Lab 04/06/13 1511  GLUCAP 109*   Coagulation:  Recent Labs Lab 04/06/13 1431 04/07/13 1045  LABPROT 20.2* 20.5*  INR 1.78* 1.82*   Urine Drug Screen: Drugs of Abuse     Component Value Date/Time   LABOPIA NONE DETECTED 01/31/2013 1030   COCAINSCRNUR NONE DETECTED 01/31/2013 1030   LABBENZ NONE DETECTED 01/31/2013 1030   AMPHETMU NONE DETECTED 01/31/2013 1030   THCU NONE DETECTED 01/31/2013 1030   LABBARB NONE DETECTED 01/31/2013 1030    Alcohol Level:  Recent Labs Lab 04/06/13 1700  ETH <11   Urinalysis:  Recent Labs Lab 04/06/13 1640  COLORURINE ORANGE*  LABSPEC 1.025  PHURINE 8.0  GLUCOSEU NEGATIVE  HGBUR NEGATIVE  BILIRUBINUR SMALL*  KETONESUR NEGATIVE  PROTEINUR NEGATIVE  UROBILINOGEN 1.0  NITRITE NEGATIVE  LEUKOCYTESUR SMALL*   Studies/Results: US Abdomen Complete  04/07/2013   CLINICAL DATA:  Elevated  LFTs, alcohol abuse, cirrhosis, hepatitis  EXAM: ULTRASOUND ABDOMEN COMPLETE  COMPARISON:  06/29/2012  FINDINGS: Gallbladder:  Small amount of gallbladder sludge with intraluminal echogenic debris. small gallbladder polyps noted. No definite shadowing gallstones. Wall thickness is mildly thickened measuring 4 mm suspect related to chronic hepatic disease. No Murphy's sign.  Common bile duct:  Diameter: 5.3 mm  Liver:  Diffusely heterogeneous and nodular compatible with hepatic cirrhosis. No intrahepatic biliary dilatation.  IVC:  No abnormality visualized.  Pancreas:  Limited visualization because of the obscuring bowel gas  Spleen:  Spleen is enlarged measuring 13 cm.   Right Kidney:  Length: 12.3 cm. Echogenicity within normal limits. No mass or hydronephrosis visualized.  Left Kidney:  Length: 12.0 cm. Echogenicity within normal limits. No mass or hydronephrosis visualized.  Abdominal aorta:  No aneurysm visualized.  Other findings:  Upper abdominal perihepatic ascites noted. Recanalized periumbilical vein visualized.  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 Recanalized periumbilical vein noted consistent with portal hypertension.  Mild gallbladder wall thickening with gallbladder sludge but no Murphy's sign, suspect related to chronic hepatic disease.   Electronically Signed   By: Ruel Favors M.D.   On: 04/07/2013 11:20   Medications: I have reviewed the patient's current medications. Scheduled Meds: . buPROPion  150 mg Oral Daily  . ciprofloxacin  500 mg Oral QPM  . folic acid  0.5 mg Oral Daily  . furosemide  20 mg Oral Daily  . lactulose  20 g Oral BID  . multivitamin with minerals  1 tablet Oral Daily  . pantoprazole  40 mg Oral BID  . rifaximin  550 mg Oral BID  . spironolactone  100 mg Oral q morning - 10a  . thiamine  100 mg Oral Daily  . traMADol  50 mg Oral Q12H  . 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 I spoke with GI, Dr. Bosie Clos, regarding patient's mildly worsened labs (Total bili elevated to 5.5 from 1.3 at last discharge in March 2015 was main concern). Dr. Bosie Clos is comfortable with discharging patient with a lab check at PCP's office later this week. He would like patient to follow up with him in the office in 2-3 weeks. I discussed with patient that she needs to be taking her lactulose 20g BID with NO skipping doses. She agrees to do this and understands that if she does not she will likely  keep returning to the hospital. Abd US done does show cirrhosis with small amount of perihepatic ascites. There is not enough fluid on her abdomen to tap today. Labs today are stable.   -discharge to home -will discontinue cipro as this is not on patient's medication list -continue home rifaximin 550mg  BID, protonix 40mg  BID, spironolactone100mg  daily, lasix 20mg  daily  -restart propranolol  -lactulose 20g BID -tramadol and naproxen for pain -BCx x 2 drawn today  # EKG changes: There were nonspecific T wave changes to EKG on admission. Repeat EKG this morning is normal EKG with no T wave changes. No concern for ACS.   # Tobacco abuse: Patient still smoking about 1ppd. Says the Wellbutrin has helped her cut back.  -continue Wellbutrin   # VTE: SCD given recent GI bleed   # Diet: regular  Code status: full   Dispo: Discharge today.  The patient does have a current PCP ([place]ittany Hout, PA-C) and [facility] after discharge.  The patient does not have transportation limitations that hinder transportation to clinic appointments.  .Services Needed at time of discharge: Y = Yes, Blank = No PT:   OT:   RN:   Equipment:   Other:     LOS: 1 day   Windell Hummingbirdachel Anthem Frazer, MD 04/07/2013, 12:15 PM

## 2013-04-07 NOTE — Discharge Summary (Signed)
Name: [name] MRN: [id number] DOB: 03-13-1970 42 y.o. 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     folic acid 409 MCG tablet  Commonly known as:  FOLVITE  Take 800 mcg by mouth daily.     furosemide 20 MG tablet  Commonly known as:  LASIX  Take 20 mg by mouth daily.     lactulose 10 GM/15ML solution  Commonly known as:  CHRONULAC  Take 30 mLs (20 g total) by mouth 2 (two) times daily.     naproxen sodium 220 MG tablet  Commonly known as:  ANAPROX  Take 220 mg by mouth daily as needed (for pain).     pantoprazole 40 MG tablet  Commonly known as:  PROTONIX  Take 40 mg by mouth 2 (two) times daily.     potassium chloride 20 MEQ/15ML (10%) solution  Take 20 mEq by mouth daily.     propranolol 20 MG tablet  Commonly known as:  INDERAL  Take 20 mg by mouth every morning.     rifaximin 550 MG Tabs tablet  Commonly known as:  XIFAXAN  Take 550 mg by mouth 2 (two) times daily.     spironolactone 100 MG tablet  Commonly known as:  ALDACTONE  Take 100 mg by mouth every morning.     thiamine 100 MG tablet  Commonly known as:  VITAMIN B-1  Take 100 mg by mouth daily.     ursodiol 300 MG capsule  Commonly known as:  ACTIGALL  Take 300 mg by mouth 2 (two) times daily.     WOMENS ONE DAILY Tabs  Take 1 tablet by mouth  daily.       Disposition and follow-up:   [name] was discharged from [facility] in Stable condition.  At the hospital follow up visit please address:  1.  Per GI patient will need follow up CMP and CBC later this week at PCP's office as well as a GI follow up within 2-3 weeks.  2. Is patient supposed to be on cipro? This was on patient's medication list per our records, but per patient's medication list that she reports is from GI, she is on rifaximin, not cipro. Please assess.   2.  Labs / imaging needed at time of follow-up: CBC, CMP  3.  Pending labs/ test needing follow-up: Hauser x 2  Follow-up Appointments: Follow-up Information   Follow up with HOUT, BRITTANY, PA-C. Call in 1 day. (SCHEDULE FOR LAB CHECK THIS WEEK)    Specialty:  Physician Assistant   Contact information:   [place]. [address] [place] [address] [phone]       Follow up with [name] Ng., MD. Schedule an appointment as soon as possible for a visit  in 2 weeks. (MAKE APPOINTMENT FOR 2-3 WEEKS.)    Specialty:  Gastroenterology   Contact information:   [address] [place] [address] [phone]       Discharge Instructions: Discharge Orders   Future Orders Complete By Expires   Call MD for:  extreme fatigue  As directed    Call MD for:  persistant nausea and vomiting  As directed    Call MD for:  temperature >100.4  As directed    Diet - low sodium heart healthy  As directed    Increase activity slowly  As directed       Consultations:  GI  Procedures Performed:  US Abdomen Complete  04/07/2013   CLINICAL DATA:  Elevated LFTs, alcohol abuse, cirrhosis, hepatitis  EXAM: ULTRASOUND ABDOMEN COMPLETE  COMPARISON:  06/29/2012  FINDINGS: Gallbladder:  Small amount of gallbladder sludge with intraluminal echogenic debris. small gallbladder polyps noted. No definite shadowing gallstones. Wall thickness is mildly thickened measuring 4  mm suspect related to chronic hepatic disease. No Murphy's sign.  Common bile duct:  Diameter: 5.3 mm  Liver:  Diffusely heterogeneous and nodular compatible with hepatic cirrhosis. No intrahepatic biliary dilatation.  IVC:  No abnormality visualized.  Pancreas:  Limited visualization because of the obscuring bowel gas  Spleen:  Spleen is enlarged measuring 13 cm.  Right Kidney:  Length: 12.3 cm. Echogenicity within normal limits. No mass or hydronephrosis visualized.  Left Kidney:  Length: 12.0 cm. Echogenicity within normal limits. No mass or hydronephrosis visualized.  Abdominal aorta:  No aneurysm visualized.  Other findings:  Upper abdominal perihepatic ascites noted. Recanalized periumbilical vein visualized.  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 Recanalized periumbilical vein noted consistent with portal hypertension.  Mild gallbladder wall thickening with gallbladder sludge but no Murphy's sign, suspect related to chronic hepatic disease.   Electronically Signed   By: [name] M.D.   On: 04/07/2013 11:20   Admission HPI:  This is a 43yo woman w/ PMH alcohol abuse, cirrhosis w/ ascites and esophageal varices (s/p multiple banding procedures last was done 03/2013 for UGI bleed), PSC (MRCP 03/2009) who presents with AMS. Per family, patient has been sleeping more than usual and has been more confused over the last two days. Patient was given lactulose 30g PO in the ED and per family, she is close to her baseline, but not quite. Patient has h/o multiple admissions for similar episodes of AMS after lactulose noncompliance. This admission, she reports compliance with lactulose 20g PO each evening (lactulose supposed to be BID, but she notes Dr. Michail King said if once daily "is working" she can do that). Patient endorses having some lower abd cramping and nausea with dry heaving x 2 days. Denies F/C, dysuria, bloody stools, hematemesis, melena, CP, SOB. Of note, patient is still  in rehab for alcohol abuse since 11/2012. She admits to having one "slip up" when she drank some alcohol when she was admitted back in February, but otherwise no alcohol since 11/07/2012. Patient is unable to list her medications and is unsure if she is taking cipro and rifamixin currently. She will have her boyfriend bring her medications to the hospital tomorrow.   Patient was most recently hospitalized twice (once in February and once in March) for bleeding esophageal varices. Dr. Michail King banded bleeding varix during hospitalization in March. No concern for bleeding at this time. Hb stable at 9.3 (last Hb 8.0 3/7).   In the ED, patient's blood pressure was  soft 104/51, though per chart review this is her baseline. She was afebrile and the rest of her vitals stable. Ammonia level found to be 109, Total bili elevated 5.5, alk phos elevated 349. Patient with chronic thrombocytopenia, though PLT found to be somewhat lower than last (115 today and 147 on 3/7).    Hospital Course by problem list:   # Hepatic encephalopathy in setting of cirrhosis and known esophageal varices s/p banding: Patient with increased confusion, drowsiness and found to have elevated ammonia level(109) c/w hepatic encephalopathy. Total bili elevated 5.5 (1.3 03/09/13), Alk phos 348 (264 03/09/13). No leukocytosis, afebrile. Patient has been taking lactulose 20g once daily, supposed to be taking it BID. + Asterixis on exam. Patient's mental status improved dramatically s/p lactulose 30g PO given in the ED. She received another 30g of lactulose overnight and mental status was at baseline in the morning. VSS, though blood pressure is soft (this is her baseline).  No concern for GI bleed this admission (hx of 2 recent admission for GI bleeding), Hb stable and no hematemesis or bloody stool. Patient had abd [place] (results above), not enough fluid to tap. Of note, cipro 531m qHS was on patient's medication list on admission, though per crosscheck  with patient's home medication list, she is not taking this medication. I would like to verify this with her PCP and GI at her follow up visits. If she needs cipro, this will need to be restarted. SBP was considered, though given patient is well appearing and her abd pain she had on admission had improved on morning of discharge, this was thought to be very unlikely. No other source of infection suspected, UA negative. We continued home rifaximin 5510mBID, protonix 4043mID, spironolactone100m87mily, lasix 20mg41mly. Home propranolol was held given soft BP, but this was restarted on discharge. Pain managed on tramadol and naproxen. BCx x 2 drawn, will need to be followed up. On morning of discharge her labs remained stable. I spoke with Dr. SchooMichail King GI who recommended lab check (CMP/CBC) later this week with GI follow up in 2-3 weeks. Patient was stable for discharge home on her home medications. She was counseled extensively to take lactulose 20g TWICE DAILY. Patient with good understanding.  # Alcohol abuse: In outpatient rehab since 11/2012. Patient denies recent use of any alcohol, last drink in February and before that last drink was in November prior to rehab. ETOH negative this admission. Will need to continue abstaining if she desires liver transplant.  # EKG changes: There were some T wave changes to anterior leads (V2 T wave inversion and V3 T wave flattening) new from prior; thought to be nonspecific. No CP or SOB. Repeat EKG on AM of discharge showed resolution of these changes and a normal EKG.   # Tobacco abuse: Patient still smoking about 1ppd. Says the Wellbutrin has helped her cut back. Continued Wellbutrin   # VTE: SCDs given recent GI bleed   Discharge Vitals:   BP 101/67  Pulse 65  Temp(Src) 98.4 F (36.9 C) (Oral)  Resp 18  Ht 5' 4"  (1.626 m)  Wt 76.204 kg (168 lb)  BMI 28.82 kg/m2  SpO2 96%  Discharge Labs:  Results for orders placed during the hospital encounter  of 04/06/13 (from the past 24 hour(s))  CBC     Status: Abnormal   Collection Time    04/06/13  2:31 PM      Result Value Ref Range   WBC 4.3  4.0 - 10.5 K/uL   RBC 2.99 (*) 3.87 - 5.11 MIL/uL   Hemoglobin 9.3 (*) 12.0 - 15.0 g/dL   HCT 28.7 (*) 36.0 - 46.0 %   MCV 96.0  78.0 - 100.0 fL   MCH 31.1  26.0 - 34.0 pg   MCHC 32.4  30.0 - 36.0 g/dL   RDW 21.2 (*) 11.5 - 15.5 %   Platelets 115 (*) 150 - 400 K/uL  COMPREHENSIVE METABOLIC PANEL     Status: Abnormal   Collection Time    04/06/13  2:31 PM      Result Value Ref Range   Sodium 139  137 - 147 mEq/L   Potassium 3.7  3.7 - 5.3 mEq/L   Chloride 101  96 - 112 mEq/L   CO2 26  19 - 32 mEq/L   Glucose, Bld 129 (*) 70 - 99 mg/dL   BUN 15  6 - 23 mg/dL   Creatinine, Ser 0.62  0.50 - 1.10 mg/dL   Calcium 8.7  8.4 - 10.5 mg/dL   Total Protein 6.8  6.0 - 8.3 g/dL   Albumin 2.5 (*) 3.5 - 5.2 g/dL   AST 54 (*) 0 - 37 U/L   ALT 20  0 - 35 U/L   Alkaline Phosphatase 348 (*) 39 - 117 U/L   Total Bilirubin 5.5 (*) 0.3 - 1.2 mg/dL   GFR calc non Af Amer >90  >90 mL/min   GFR calc Af Amer >90  >90 mL/min  AMMONIA     Status: Abnormal   Collection Time    04/06/13  2:31 PM      Result Value Ref Range   Ammonia 109 (*) 11 - 60 umol/L  PROTIME-INR     Status: Abnormal   Collection Time    04/06/13  2:31 PM      Result Value Ref Range   Prothrombin Time 20.2 (*) 11.6 - 15.2 seconds   INR 1.78 (*) 0.00 - 1.49  APTT     Status: Abnormal   Collection Time    04/06/13  2:31 PM      Result Value Ref Range   aPTT 40 (*) 24 - 37 seconds  TROPONIN I     Status: None   Collection Time    04/06/13  2:31 PM      Result Value Ref Range   Troponin I <0.30  <0.30 ng/mL  CBG MONITORING, ED     Status: Abnormal   Collection Time    04/06/13  3:11 PM      Result Value Ref Range   Glucose-Capillary 109 (*) 70 - 99 mg/dL  URINALYSIS, ROUTINE W REFLEX MICROSCOPIC     Status: Abnormal   Collection Time    04/06/13  4:40 PM      Result Value Ref  Range   Color, Urine ORANGE (*) YELLOW   APPearance CLEAR  CLEAR   Specific Gravity, Urine 1.025  1.005 - 1.030   pH 8.0  5.0 - 8.0   Glucose, UA NEGATIVE  NEGATIVE mg/dL   Hgb urine dipstick NEGATIVE  NEGATIVE   Bilirubin Urine SMALL (*) NEGATIVE   Ketones, ur NEGATIVE  NEGATIVE mg/dL   Protein, ur NEGATIVE  NEGATIVE mg/dL   Urobilinogen, UA 1.0  0.0 - 1.0 mg/dL   Nitrite NEGATIVE  NEGATIVE   Leukocytes, UA SMALL (*) NEGATIVE  URINE MICROSCOPIC-ADD ON     Status: None   Collection Time    04/06/13  4:40  PM      Result Value Ref Range   Squamous Epithelial / LPF RARE  RARE   WBC, UA 0-2  <3 WBC/hpf   Urine-Other MUCOUS PRESENT    ETHANOL     Status: None   Collection Time    04/06/13  5:00 PM      Result Value Ref Range   Alcohol, Ethyl (B) <11  0 - 11 mg/dL  COMPREHENSIVE METABOLIC PANEL     Status: Abnormal   Collection Time    04/07/13 10:45 AM      Result Value Ref Range   Sodium 141  137 - 147 mEq/L   Potassium 4.0  3.7 - 5.3 mEq/L   Chloride 105  96 - 112 mEq/L   CO2 24  19 - 32 mEq/L   Glucose, Bld 113 (*) 70 - 99 mg/dL   BUN 13  6 - 23 mg/dL   Creatinine, Ser 0.64  0.50 - 1.10 mg/dL   Calcium 9.0  8.4 - 10.5 mg/dL   Total Protein 6.3  6.0 - 8.3 g/dL   Albumin 2.3 (*) 3.5 - 5.2 g/dL   AST 56 (*) 0 - 37 U/L   ALT 19  0 - 35 U/L   Alkaline Phosphatase 310 (*) 39 - 117 U/L   Total Bilirubin 5.4 (*) 0.3 - 1.2 mg/dL   GFR calc non Af Amer >90  >90 mL/min   GFR calc Af Amer >90  >90 mL/min  CBC     Status: Abnormal   Collection Time    04/07/13 10:45 AM      Result Value Ref Range   WBC 4.2  4.0 - 10.5 K/uL   RBC 2.74 (*) 3.87 - 5.11 MIL/uL   Hemoglobin 8.5 (*) 12.0 - 15.0 g/dL   HCT 26.8 (*) 36.0 - 46.0 %   MCV 97.8  78.0 - 100.0 fL   MCH 31.0  26.0 - 34.0 pg   MCHC 31.7  30.0 - 36.0 g/dL   RDW 21.4 (*) 11.5 - 15.5 %   Platelets 108 (*) 150 - 400 K/uL  PROTIME-INR     Status: Abnormal   Collection Time    04/07/13 10:45 AM      Result Value Ref Range    Prothrombin Time 20.5 (*) 11.6 - 15.2 seconds   INR 1.82 (*) 0.00 - 1.49    Signed: Rebecca Eaton, MD 04/07/2013, 12:31 PM   Time Spent on Discharge:35 minutes Services Ordered on Discharge: none Equipment Ordered on Discharge: none

## 2013-04-07 NOTE — Progress Notes (Signed)
Patient discharged home with family. Patient discharged with discharge instructions and prescriptions. Patient was given education on hepatic encephalopathy. Patient was stable upon discharge.

## 2013-04-07 NOTE — H&P (Signed)
INTERNAL MEDICINE TEACHING ATTENDING NOTE  Day 1 of stay  Patient name: [name]  MRN: [id number] Date of birth: 02/09/1970   43 y.o.woman with cirrhosis with ascitis, chronically elevated liver indices, esophageal varices s/p banding, PSC presented with altered mentation most likely secondary to non-compliance to her lactulose regimen. She has been admitted with similar presentation in the past. She follows Dr. Bosie ClosSchooler for her GI issues as outpatient. There is some confusion surrounding her outpatient medications. In this hospital course, her lactulose has been resumed and her sensorium is now restored.   Upon evaluation today, the is alert and oriented, aware of what happened to her and asking about next plans. Exam is significant for a distended abdomen, everted umblicus and positive fluid thrill.  Her vitals have been stable. Filed Vitals:   04/06/13 1650 04/06/13 1700 04/06/13 2100 04/07/13 0600  BP: 93/47 117/73 109/64 98/58  Pulse:  68 67 82  Temp:  98.1 F (36.7 C) 98.5 F (36.9 C) 98.9 F (37.2 C)  TempSrc:   Oral Oral  Resp: 10 17 18 16   Height:      Weight:      SpO2: 100% 100% 100% 98%     Her admission labs look similar to previous labs with slight elevation seen in alkaline phosphatase levels and marked elevation in total bilirubin from 1.3 to 5.5. The patient has had worse bilirubin levels before, however given this acute rise, we will follow it, and consult GI. The patient does not have any signs of SBP at this time. Complete abdomen ultrasound has been done and report is awaited. There is no bleeding episode. The patient has been a difficult to draw blood from, therefore follow up labs have been delayed. I will advise my team to try to get blood work done on this patient. Agree with repeating EKG and following up on the changes which seem non-specific yet, we will be cautious and rule out MI. Troponin levels negative.  I have seen and evaluated this patient and  discussed it with my IM resident team.  Please see the rest of the plan per resident note from today.   Lillie Bollig 04/07/2013, 9:53 AM.

## 2013-04-09 NOTE — Discharge Summary (Signed)
Discussed with resident.

## 2013-04-13 LAB — CULTURE, BLOOD (ROUTINE X 2)
CULTURE: NO GROWTH
Culture: NO GROWTH

## 2013-05-10 ENCOUNTER — Emergency Department (HOSPITAL_COMMUNITY): Payer: Medicaid Other

## 2013-05-10 ENCOUNTER — Inpatient Hospital Stay (HOSPITAL_COMMUNITY)
Admission: EM | Admit: 2013-05-10 | Discharge: 2013-05-17 | DRG: 871 | Disposition: A | Discharge status: Home Health | Payer: Medicaid Other | Attending: Internal Medicine | Admitting: Internal Medicine

## 2013-05-10 ENCOUNTER — Encounter (HOSPITAL_COMMUNITY): Payer: Self-pay | Admitting: Emergency Medicine

## 2013-05-10 DIAGNOSIS — K920 Hematemesis: Secondary | ICD-10-CM

## 2013-05-10 DIAGNOSIS — R9431 Abnormal electrocardiogram [ECG] [EKG]: Secondary | ICD-10-CM

## 2013-05-10 DIAGNOSIS — R609 Edema, unspecified: Secondary | ICD-10-CM

## 2013-05-10 DIAGNOSIS — I10 Essential (primary) hypertension: Secondary | ICD-10-CM | POA: Diagnosis present

## 2013-05-10 DIAGNOSIS — F32A Depression, unspecified: Secondary | ICD-10-CM

## 2013-05-10 DIAGNOSIS — J9600 Acute respiratory failure, unspecified whether with hypoxia or hypercapnia: Secondary | ICD-10-CM

## 2013-05-10 DIAGNOSIS — F329 Major depressive disorder, single episode, unspecified: Secondary | ICD-10-CM | POA: Diagnosis present

## 2013-05-10 DIAGNOSIS — K838 Other specified diseases of biliary tract: Secondary | ICD-10-CM | POA: Diagnosis present

## 2013-05-10 DIAGNOSIS — K8309 Other cholangitis: Secondary | ICD-10-CM

## 2013-05-10 DIAGNOSIS — I81 Portal vein thrombosis: Secondary | ICD-10-CM | POA: Diagnosis present

## 2013-05-10 DIAGNOSIS — Z8719 Personal history of other diseases of the digestive system: Secondary | ICD-10-CM

## 2013-05-10 DIAGNOSIS — J189 Pneumonia, unspecified organism: Secondary | ICD-10-CM | POA: Diagnosis present

## 2013-05-10 DIAGNOSIS — I8511 Secondary esophageal varices with bleeding: Secondary | ICD-10-CM

## 2013-05-10 DIAGNOSIS — R45851 Suicidal ideations: Secondary | ICD-10-CM

## 2013-05-10 DIAGNOSIS — F1010 Alcohol abuse, uncomplicated: Secondary | ICD-10-CM

## 2013-05-10 DIAGNOSIS — Z91199 Patient's noncompliance with other medical treatment and regimen due to unspecified reason: Secondary | ICD-10-CM

## 2013-05-10 DIAGNOSIS — R6520 Severe sepsis without septic shock: Secondary | ICD-10-CM

## 2013-05-10 DIAGNOSIS — R17 Unspecified jaundice: Secondary | ICD-10-CM

## 2013-05-10 DIAGNOSIS — X31XXXA Exposure to excessive natural cold, initial encounter: Secondary | ICD-10-CM | POA: Diagnosis present

## 2013-05-10 DIAGNOSIS — F419 Anxiety disorder, unspecified: Secondary | ICD-10-CM

## 2013-05-10 DIAGNOSIS — R4182 Altered mental status, unspecified: Secondary | ICD-10-CM

## 2013-05-10 DIAGNOSIS — T68XXXA Hypothermia, initial encounter: Secondary | ICD-10-CM

## 2013-05-10 DIAGNOSIS — K759 Inflammatory liver disease, unspecified: Secondary | ICD-10-CM

## 2013-05-10 DIAGNOSIS — D689 Coagulation defect, unspecified: Secondary | ICD-10-CM

## 2013-05-10 DIAGNOSIS — K8301 Primary sclerosing cholangitis: Secondary | ICD-10-CM

## 2013-05-10 DIAGNOSIS — K7030 Alcoholic cirrhosis of liver without ascites: Secondary | ICD-10-CM | POA: Diagnosis present

## 2013-05-10 DIAGNOSIS — I8510 Secondary esophageal varices without bleeding: Secondary | ICD-10-CM

## 2013-05-10 DIAGNOSIS — F411 Generalized anxiety disorder: Secondary | ICD-10-CM | POA: Diagnosis present

## 2013-05-10 DIAGNOSIS — D649 Anemia, unspecified: Secondary | ICD-10-CM

## 2013-05-10 DIAGNOSIS — E669 Obesity, unspecified: Secondary | ICD-10-CM | POA: Diagnosis present

## 2013-05-10 DIAGNOSIS — F3289 Other specified depressive episodes: Secondary | ICD-10-CM | POA: Diagnosis present

## 2013-05-10 DIAGNOSIS — F17200 Nicotine dependence, unspecified, uncomplicated: Secondary | ICD-10-CM | POA: Diagnosis present

## 2013-05-10 DIAGNOSIS — E2749 Other adrenocortical insufficiency: Secondary | ICD-10-CM | POA: Diagnosis present

## 2013-05-10 DIAGNOSIS — Y9210 Unspecified residential institution as the place of occurrence of the external cause: Secondary | ICD-10-CM | POA: Diagnosis present

## 2013-05-10 DIAGNOSIS — T380X5A Adverse effect of glucocorticoids and synthetic analogues, initial encounter: Secondary | ICD-10-CM | POA: Diagnosis present

## 2013-05-10 DIAGNOSIS — N179 Acute kidney failure, unspecified: Secondary | ICD-10-CM | POA: Diagnosis present

## 2013-05-10 DIAGNOSIS — K7290 Hepatic failure, unspecified without coma: Secondary | ICD-10-CM

## 2013-05-10 DIAGNOSIS — T07XXXA Unspecified multiple injuries, initial encounter: Secondary | ICD-10-CM | POA: Diagnosis present

## 2013-05-10 DIAGNOSIS — K769 Liver disease, unspecified: Secondary | ICD-10-CM | POA: Diagnosis present

## 2013-05-10 DIAGNOSIS — F1210 Cannabis abuse, uncomplicated: Secondary | ICD-10-CM | POA: Diagnosis present

## 2013-05-10 DIAGNOSIS — Z9119 Patient's noncompliance with other medical treatment and regimen: Secondary | ICD-10-CM

## 2013-05-10 DIAGNOSIS — A419 Sepsis, unspecified organism: Principal | ICD-10-CM

## 2013-05-10 DIAGNOSIS — D696 Thrombocytopenia, unspecified: Secondary | ICD-10-CM

## 2013-05-10 DIAGNOSIS — R791 Abnormal coagulation profile: Secondary | ICD-10-CM | POA: Diagnosis present

## 2013-05-10 DIAGNOSIS — R188 Other ascites: Secondary | ICD-10-CM

## 2013-05-10 DIAGNOSIS — E876 Hypokalemia: Secondary | ICD-10-CM | POA: Diagnosis present

## 2013-05-10 DIAGNOSIS — K922 Gastrointestinal hemorrhage, unspecified: Secondary | ICD-10-CM

## 2013-05-10 DIAGNOSIS — K7682 Hepatic encephalopathy: Secondary | ICD-10-CM

## 2013-05-10 DIAGNOSIS — Z6833 Body mass index (BMI) 33.0-33.9, adult: Secondary | ICD-10-CM

## 2013-05-10 DIAGNOSIS — I959 Hypotension, unspecified: Secondary | ICD-10-CM

## 2013-05-10 DIAGNOSIS — N390 Urinary tract infection, site not specified: Secondary | ICD-10-CM | POA: Diagnosis present

## 2013-05-10 DIAGNOSIS — F1020 Alcohol dependence, uncomplicated: Secondary | ICD-10-CM

## 2013-05-10 DIAGNOSIS — R6521 Severe sepsis with septic shock: Secondary | ICD-10-CM | POA: Diagnosis present

## 2013-05-10 DIAGNOSIS — F410 Panic disorder [episodic paroxysmal anxiety] without agoraphobia: Secondary | ICD-10-CM

## 2013-05-10 DIAGNOSIS — G9340 Encephalopathy, unspecified: Secondary | ICD-10-CM | POA: Diagnosis present

## 2013-05-10 DIAGNOSIS — K7460 Unspecified cirrhosis of liver: Secondary | ICD-10-CM

## 2013-05-10 LAB — TYPE AND SCREEN
ABO/RH(D): O NEG
ANTIBODY SCREEN: NEGATIVE

## 2013-05-10 LAB — CBC WITH DIFFERENTIAL/PLATELET
BASOS PCT: 1 % (ref 0–1)
Basophils Absolute: 0.1 10*3/uL (ref 0.0–0.1)
EOS ABS: 0.1 10*3/uL (ref 0.0–0.7)
Eosinophils Relative: 2 % (ref 0–5)
HEMATOCRIT: 30.2 % — AB (ref 36.0–46.0)
Hemoglobin: 9.9 g/dL — ABNORMAL LOW (ref 12.0–15.0)
Lymphocytes Relative: 14 % (ref 12–46)
Lymphs Abs: 0.7 10*3/uL (ref 0.7–4.0)
MCH: 31.8 pg (ref 26.0–34.0)
MCHC: 32.8 g/dL (ref 30.0–36.0)
MCV: 97.1 fL (ref 78.0–100.0)
MONOS PCT: 18 % — AB (ref 3–12)
Monocytes Absolute: 0.9 10*3/uL (ref 0.1–1.0)
NEUTROS ABS: 3.4 10*3/uL (ref 1.7–7.7)
Neutrophils Relative %: 65 % (ref 43–77)
Platelets: 93 10*3/uL — ABNORMAL LOW (ref 150–400)
RBC: 3.11 MIL/uL — ABNORMAL LOW (ref 3.87–5.11)
RDW: 21.2 % — ABNORMAL HIGH (ref 11.5–15.5)
WBC: 5.2 10*3/uL (ref 4.0–10.5)

## 2013-05-10 LAB — COMPREHENSIVE METABOLIC PANEL
ALBUMIN: 2.2 g/dL — AB (ref 3.5–5.2)
ALK PHOS: 277 U/L — AB (ref 39–117)
ALT: 23 U/L (ref 0–35)
AST: 71 U/L — AB (ref 0–37)
BUN: 24 mg/dL — AB (ref 6–23)
CO2: 27 mEq/L (ref 19–32)
Calcium: 8.3 mg/dL — ABNORMAL LOW (ref 8.4–10.5)
Chloride: 95 mEq/L — ABNORMAL LOW (ref 96–112)
Creatinine, Ser: 2.58 mg/dL — ABNORMAL HIGH (ref 0.50–1.10)
GFR calc Af Amer: 25 mL/min — ABNORMAL LOW (ref >90)
GFR calc non Af Amer: 21 mL/min — ABNORMAL LOW (ref >90)
Glucose, Bld: 97 mg/dL (ref 70–99)
POTASSIUM: 3.5 meq/L — AB (ref 3.7–5.3)
SODIUM: 134 meq/L — AB (ref 137–147)
TOTAL PROTEIN: 5.7 g/dL — AB (ref 6.0–8.3)
Total Bilirubin: 5.7 mg/dL — ABNORMAL HIGH (ref 0.3–1.2)

## 2013-05-10 LAB — URINALYSIS, ROUTINE W REFLEX MICROSCOPIC
Glucose, UA: NEGATIVE mg/dL
Hgb urine dipstick: NEGATIVE
Ketones, ur: 15 mg/dL — AB
NITRITE: POSITIVE — AB
PH: 5 (ref 5.0–8.0)
Protein, ur: NEGATIVE mg/dL
SPECIFIC GRAVITY, URINE: 1.025 (ref 1.005–1.030)
Urobilinogen, UA: 1 mg/dL (ref 0.0–1.0)

## 2013-05-10 LAB — URINE MICROSCOPIC-ADD ON

## 2013-05-10 LAB — GLUCOSE, CAPILLARY: Glucose-Capillary: 86 mg/dL (ref 70–99)

## 2013-05-10 LAB — LACTIC ACID, PLASMA: LACTIC ACID, VENOUS: 0.9 mmol/L (ref 0.5–2.2)

## 2013-05-10 LAB — POC URINE PREG, ED: Preg Test, Ur: NEGATIVE

## 2013-05-10 LAB — TROPONIN I: Troponin I: <0.3 ng/mL (ref <0.30)

## 2013-05-10 LAB — APTT: aPTT: 48 seconds — ABNORMAL HIGH (ref 24–37)

## 2013-05-10 LAB — PROTIME-INR
INR: 1.86 — AB (ref 0.00–1.49)
Prothrombin Time: 20.9 seconds — ABNORMAL HIGH (ref 11.6–15.2)

## 2013-05-10 LAB — MRSA PCR SCREENING: MRSA by PCR: NEGATIVE

## 2013-05-10 LAB — PRO B NATRIURETIC PEPTIDE: Pro B Natriuretic peptide (BNP): 328.6 pg/mL — ABNORMAL HIGH (ref 0–125)

## 2013-05-10 LAB — CORTISOL: Cortisol, Plasma: 3.8 ug/dL

## 2013-05-10 LAB — AMMONIA: AMMONIA: 56 umol/L (ref 11–60)

## 2013-05-10 LAB — FIBRINOGEN: Fibrinogen: 146 mg/dL — ABNORMAL LOW (ref 204–475)

## 2013-05-10 LAB — POC OCCULT BLOOD, ED: Fecal Occult Bld: POSITIVE — AB

## 2013-05-10 LAB — I-STAT CG4 LACTIC ACID, ED: Lactic Acid, Venous: 1.47 mmol/L (ref 0.5–2.2)

## 2013-05-10 MED ORDER — VANCOMYCIN HCL IN DEXTROSE 1-5 GM/200ML-% IV SOLN: 1000.0000 mg | INTRAVENOUS | Status: DC

## 2013-05-10 MED ORDER — DEXTROSE 5 % IV SOLN
1.0000 g | INTRAVENOUS | Status: AC
  Administered 2013-05-10: 1 g via INTRAVENOUS
  Filled 2013-05-10: qty 10

## 2013-05-10 MED ORDER — VANCOMYCIN HCL 10 G IV SOLR
1250.0000 mg | INTRAVENOUS | Status: AC
  Administered 2013-05-10: 1250 mg via INTRAVENOUS
  Filled 2013-05-10: qty 1250

## 2013-05-10 MED ORDER — LACTULOSE 10 GM/15ML PO SOLN
30.0000 g | Freq: Three times a day (TID) | ORAL | Status: DC
  Administered 2013-05-10 – 2013-05-14 (×14): 30 g via ORAL
  Filled 2013-05-10 (×20): qty 45

## 2013-05-10 MED ORDER — SODIUM CHLORIDE 0.9 % IV SOLN
Freq: Once | INTRAVENOUS | Status: AC
  Administered 2013-05-10: 12:00:00 via INTRAVENOUS

## 2013-05-10 MED ORDER — SODIUM CHLORIDE 0.9 % IV BOLUS (SEPSIS)
1000.0000 mL | INTRAVENOUS | Status: AC | PRN
  Administered 2013-05-10 (×2): 1000 mL via INTRAVENOUS

## 2013-05-10 MED ORDER — DEXTROSE 5 % IV SOLN: 1.0000 g | INTRAVENOUS | Status: DC

## 2013-05-10 MED ORDER — VANCOMYCIN HCL IN DEXTROSE 1-5 GM/200ML-% IV SOLN
1000.0000 mg | INTRAVENOUS | Status: DC
  Filled 2013-05-10: qty 200

## 2013-05-10 MED ORDER — SODIUM CHLORIDE 0.9 % IV BOLUS (SEPSIS)
500.0000 mL | Freq: Once | INTRAVENOUS | Status: AC
  Administered 2013-05-10: 500 mL via INTRAVENOUS

## 2013-05-10 MED ORDER — VANCOMYCIN HCL IN DEXTROSE 1-5 GM/200ML-% IV SOLN: 1000.0000 mg | Freq: Once | INTRAVENOUS | Status: DC

## 2013-05-10 MED ORDER — DEXTROSE 5 % IV SOLN
1.0000 g | INTRAVENOUS | Status: DC
  Filled 2013-05-10: qty 1

## 2013-05-10 MED ORDER — DEXTROSE 5 % IV SOLN
2.0000 g | INTRAVENOUS | Status: AC
  Administered 2013-05-10: 2 g via INTRAVENOUS

## 2013-05-10 MED ORDER — SODIUM CHLORIDE 0.9 % IV BOLUS (SEPSIS)
1000.0000 mL | INTRAVENOUS | Status: DC | PRN
  Administered 2013-05-10: 1000 mL via INTRAVENOUS

## 2013-05-10 MED ORDER — SODIUM CHLORIDE 0.9 % IV SOLN: Freq: Once | INTRAVENOUS | Status: DC

## 2013-05-10 MED ORDER — SODIUM CHLORIDE 0.9 % IV BOLUS (SEPSIS)
500.0000 mL | Freq: Once | INTRAVENOUS | Status: AC
Start: 2013-05-10 — End: 2013-05-10
  Administered 2013-05-10: 500 mL via INTRAVENOUS

## 2013-05-10 MED ORDER — DEXTROSE 5 % IV SOLN: 2.0000 g | Freq: Once | INTRAVENOUS | Status: DC

## 2013-05-10 NOTE — Plan of Care (Signed)
I was called for this patient, Ms Gearldine Shownnita Thien by EDP, Dr Fonnie JarvisBednar, briefly 43 year old female with history of liver cirrhosis, with ascites, esophageal varices, (s/p multiple banding procedures last on here was done 03/2013 for UGI bleed) initially presented with altered mental status, recently dc'ed from [facility] after an upper GI bleed.  BP 65/25  Pulse 69  Temp(Src) 97.8 F (36.6 C) (Oral)  Resp 16  SpO2 98%   On exam, patient alert oriented x4, NAD Gen.: scleral Icterus, PERLA,dressing intact on previous site of central line, right IJ CVS: Regular rate and rhythm Chest: Clear no wheezing rales or rhonchi Abdomen: Distended with mild diffuse tenderness to deep palpation, soft no rebound or guarding Extremities: No cyanosis or clubbing, 2+ pitting edema Neuro: No gross focal neurological deficits Labs reviewed A/p Hypotensive/? Septic shock, hepatorenal failure with end-stage liver disease - Patient is recommended sepsis protocol, IV fluids, antibiotics, vasopressors, respiratory support if deteriorating  - Patient alert and oriented x4 and requested for full CODE STATUS, addressed with PCCM who also discussed with the patient again. Patient reconfirmed full code status despite poor prognosis and guarded condition. - PCCM will admit the patient.   Kinlee Garrison Jenna LuoK Alexcis Bicking M.D. [facility] 05/10/2013, 4:43 PM  Pager: [phone]

## 2013-05-10 NOTE — ED Notes (Addendum)
Report received from Marisa CyphersBrenda M, RN. Pt sleeping/ resting, arousable to voice, interactive, falls back to sleep, NAD, calm, skin W&D, BP low, VSS, 3rd liter infusing 100cc/hr. vanc complete. [facility] Pending bed assignment.

## 2013-05-10 NOTE — Progress Notes (Signed)
Pt was unable to produce sputum for sample. Pt was told that if able to produce sputum to spit in cup. Pt was drowsy. Family at bedside. Specimen container left at bedside on bedside table.

## 2013-05-10 NOTE — Progress Notes (Addendum)
ANTIBIOTIC CONSULT NOTE - INITIAL  Pharmacy Consult for vancomycin and rocephin Indication: rule out sepsis  No Known Allergies  Patient Measurements:   Adjusted Body Weight:   Vital Signs: Temp: 95.5 F (35.3 C) (05/08 1212) Temp src: Rectal (05/08 1212) BP: 71/40 mmHg (05/08 1327) Pulse Rate: 65 (05/08 1327) Intake/Output from previous day:   Intake/Output from this shift:    Labs:  Recent Labs  05/10/13 1156 05/10/13 1158  WBC  --  5.2  HGB  --  9.9*  PLT  --  93*  CREATININE 2.58*  --    The CrCl is unknown because both a height and weight (above a minimum accepted value) are required for this calculation. No results found for this basename: VANCOTROUGH, VANCOPEAK, VANCORANDOM, GENTTROUGH, GENTPEAK, GENTRANDOM, TOBRATROUGH, TOBRAPEAK, TOBRARND, AMIKACINPEAK, AMIKACINTROU, AMIKACIN,  in the last 72 hours   Microbiology: No results found for this or any previous visit (from the past 720 hour(s)).  Medical History: Past Medical History  Diagnosis Date  . Sclerosing cholangitis   . Panic attacks   . Anxiety   . Alcohol abuse   . Ascites   . Cirrhosis of liver   . EtOH dependence 08/31/2012  . Family history of anesthesia complication     " my son had problems "  . Depression   . Anemia   . Hepatitis     Medications:  Scheduled:   Assessment: 43yo female with R/O sepsis, stated height & wt are 64" and 140lb.  No antibiotics have been administered.  CrCl ~6934ml/min   Goal of Therapy:  Vancomycin trough level 15-20 mcg/ml  Plan:  1-  Rocephin 1gm IV q24 2-  Vancomycin 1250mg  IV q24, then 1000mg  IV q24 3-  Watch renal fxn closely and f/u any culture data  Marisue HumbleKendra Hiatt, PharmD Clinical Pharmacist [facility]   Addendum: Changing ceftriaxone to cefepime for broader coverage. Pt with renal and liver dysfunction. MD ordered 2gm x 1.  Plan: 1. Cefepime 1gm IV Q24H starting tomorrow 2. F/u renal fxn, C&S, clinical  status  Lysle PearlRachel Tawnya Pujol, PharmD, BCPS Pager # [phone] 05/10/2013 4:46 PM

## 2013-05-10 NOTE — ED Notes (Signed)
Pt placed on 2L O2 d/t sats dropping to 88%.

## 2013-05-10 NOTE — ED Notes (Signed)
Dr Herma CarsonZ placing bed request order.

## 2013-05-10 NOTE — Procedures (Signed)
Central Venous Catheter Insertion Procedure Note Gearldine Shownnita Alessandrini [id number] [birth year]  Procedure: Insertion of Central Venous Catheter Indications: Drug and/or fluid administration  Procedure Details Consent: Risks of procedure as well as the alternatives and risks of each were explained to the (patient/caregiver).  Consent for procedure obtained. Time Out: Verified patient identification, verified procedure, site/side was marked, verified correct patient position, special equipment/implants available, medications/allergies/relevent history reviewed, required imaging and test results available.  Performed  Maximum sterile technique was used including antiseptics, cap, gloves, gown, hand hygiene, mask and sheet. Skin prep: Chlorhexidine; local anesthetic administered A antimicrobial bonded/coated triple lumen catheter was placed in the right internal jugular vein using the Seldinger technique.  Evaluation Blood flow good Complications: No apparent complications Patient did tolerate procedure well. Chest X-ray ordered to verify placement.  CXR: pending.  U/S used in placement.  Alyson ReedyWesam G Mackenize Delgadillo 05/10/2013, 4:34 PM

## 2013-05-10 NOTE — ED Notes (Addendum)
Pt ao x 4.  Now states she wants everything to be done for her.

## 2013-05-10 NOTE — ED Provider Notes (Signed)
CSN: [id number]     Arrival date & time 05/10/13  1133 History   None    Chief Complaint  Patient presents with  . Altered Mental Status     (Consider location/radiation/quality/duration/timing/severity/associated sxs/prior Treatment)  HPI  [name] is a 43 y.o. woman w/ PMH alcohol abuse, liver cirrhosis w/ ascites and esophageal varices (s/p multiple banding procedures last on here was done 03/2013 for UGI bleed), PSC (MRCP 03/2009) who presents with AMS. Husband is present to assist in the history.  Patient was [facility] after an upper GI bleed. She had issues with altered mental status during that admission and per the patient and her husband she never returned to normal after discharge. Of note, her lactulose frequency was increased to 3 times a day (from BID), Rifaximin was discontinued, and she was started on Xanax (??). She was also discharged on Levaquin 750 mg by mouth daily for presumed pneumonia but it appears she was noncompliant with this.  Patient denies bloody stools, melena, hemoptysis, hematemesis since discharge from the hospital. She is having some mild abdominal pain. She reports some fevers and chills at home. She's had leg swelling for several weeks. She took her medicines including the Xanax this morning. Husband says she was nodding off in the car so he brought her to the ER.  She denies current alcohol use but admits to "falling off the wagon once" recently.  PCP is Sierra LeoneBrittany Hout.   Past Medical History  Diagnosis Date  . Sclerosing cholangitis   . Panic attacks   . Anxiety   . Alcohol abuse   . Ascites   . Cirrhosis of liver   . EtOH dependence 08/31/2012  . Family history of anesthesia complication     " my son had problems "  . Depression   . Anemia   . Hepatitis    Past Surgical History  Procedure Laterality Date  . None    . Ercp    . Esophagogastroduodenoscopy N/A 09/10/2012    Procedure:  ESOPHAGOGASTRODUODENOSCOPY (EGD);  Surgeon: Florencia Reasonsobert V Buccini, MD;  [facility];  Service: Endoscopy;  Laterality: N/A;  . Esophagogastroduodenoscopy N/A 11/04/2012    Procedure: ESOPHAGOGASTRODUODENOSCOPY (EGD);  Surgeon: Barrie FolkJohn C Hayes, MD;  [facility];  Service: Endoscopy;  Laterality: N/A;  . Esophagogastroduodenoscopy N/A 03/06/2013    Procedure: ESOPHAGOGASTRODUODENOSCOPY (EGD);  Surgeon: Shirley FriarVincent C. Schooler, MD;  [facility];  Service: Endoscopy;  Laterality: N/A;  Beside   Family History  Problem Relation Age of Onset  . CAD Father    History  Substance Use Topics  . Smoking status: Current Every Day Smoker -- 0.50 packs/day for 20 years    Types: Cigarettes  . Smokeless tobacco: Never Used  . Alcohol Use: No     Comment: Pt drinks vodka/liquor every weekend, denies daily drinking...states she quit a month ago....   OB History   Grav Para Term Preterm Abortions TAB SAB Ect Mult Living                 Review of Systems  Constitutional: Positive for fever and chills.  Respiratory: Negative for shortness of breath.   Gastrointestinal: Positive for abdominal pain. Negative for diarrhea, constipation, blood in stool and anal bleeding.  Psychiatric/Behavioral: Positive for confusion.      Allergies  Review of patient's allergies indicates no known allergies.  Home Medications   Prior to Admission medications   Medication Sig Start Date End Date Taking? Authorizing  Provider  buPROPion (WELLBUTRIN XL) 150 MG 24 hr tablet Take 150 mg by mouth daily.    Historical Provider, MD  folic acid (FOLVITE) 800 MCG tablet Take 800 mcg by mouth daily.    Historical Provider, MD  furosemide (LASIX) 20 MG tablet Take 20 mg by mouth daily.    Historical Provider, MD  lactulose (CHRONULAC) 10 GM/15ML solution Take 30 mLs (20 g total) by mouth 2 (two) times daily. 04/07/13   Windell Hummingbirdachel Chikowski, MD  Multiple Vitamins-Minerals (WOMENS ONE DAILY) TABS Take 1 tablet by  mouth daily.    Historical Provider, MD  naproxen sodium (ANAPROX) 220 MG tablet Take 220 mg by mouth daily as needed (for pain).    Historical Provider, MD  pantoprazole (PROTONIX) 40 MG tablet Take 40 mg by mouth 2 (two) times daily.    Historical Provider, MD  potassium chloride 20 MEQ/15ML (10%) solution Take 20 mEq by mouth daily.     Historical Provider, MD  propranolol (INDERAL) 20 MG tablet Take 20 mg by mouth every morning.     Historical Provider, MD  rifaximin (XIFAXAN) 550 MG TABS tablet Take 550 mg by mouth 2 (two) times daily.    Historical Provider, MD  spironolactone (ALDACTONE) 100 MG tablet Take 100 mg by mouth every morning.     Historical Provider, MD  thiamine (VITAMIN B-1) 100 MG tablet Take 100 mg by mouth daily.    Historical Provider, MD  ursodiol (ACTIGALL) 300 MG capsule Take 300 mg by mouth 2 (two) times daily.    Historical Provider, MD   BP 70/37  Pulse 72  Temp(Src) 95.5 F (35.3 C) (Rectal)  Resp 10  SpO2 92% Physical Exam  Constitutional: She appears lethargic. She appears ill (Jaundiced, sunken eyes).  Eyes: Conjunctivae and EOM are normal. Pupils are equal, round, and reactive to light.  Neck: Normal range of motion. Neck supple.  Bandage in place over right IJ, sight of central [facility] at [facility], site is clean, dry, intact  Cardiovascular: Normal rate and regular rhythm.  Exam reveals no gallop and no friction rub.   No murmur heard. Pulmonary/Chest: Effort normal and breath sounds normal. No respiratory distress. She has no wheezes. She has no rales. She exhibits no tenderness.  Abdominal: Soft. She exhibits distension. She exhibits no mass. There is tenderness (Mild diffuse tenderness to deep palpation). There is no rebound and no guarding.  Musculoskeletal: She exhibits edema (2+ pitting edema to thighs).  Neurological: She appears lethargic.  Patient is oriented and responds to most questions appropriately, but very somnolent    ED Course   Procedures (including critical care time) Labs Review Labs Reviewed  COMPREHENSIVE METABOLIC PANEL - Abnormal; Notable for the following:    Sodium 134 (*)    Potassium 3.5 (*)    Chloride 95 (*)    BUN 24 (*)    Creatinine, Ser 2.58 (*)    Calcium 8.3 (*)    Total Protein 5.7 (*)    Albumin 2.2 (*)    AST 71 (*)    Alkaline Phosphatase 277 (*)    Total Bilirubin 5.7 (*)    GFR calc non Af Amer 21 (*)    GFR calc Af Amer 25 (*)    All other components within normal limits  CBC WITH DIFFERENTIAL - Abnormal; Notable for the following:    RBC 3.11 (*)    Hemoglobin 9.9 (*)    HCT 30.2 (*)    RDW 21.2 (*)  Platelets 93 (*)    Monocytes Relative 18 (*)    All other components within normal limits  PROTIME-INR - Abnormal; Notable for the following:    Prothrombin Time 20.9 (*)    INR 1.86 (*)    All other components within normal limits  PRO B NATRIURETIC PEPTIDE - Abnormal; Notable for the following:    Pro B Natriuretic peptide (BNP) 328.6 (*)    All other components within normal limits  CULTURE, BLOOD (ROUTINE X 2)  CULTURE, BLOOD (ROUTINE X 2)  URINE CULTURE  AMMONIA  URINALYSIS, ROUTINE W REFLEX MICROSCOPIC  POC OCCULT BLOOD, ED  I-STAT CG4 LACTIC ACID, ED  POC URINE PREG, ED  TYPE AND SCREEN    Imaging Review Dg Chest Portable 1 View  05/10/2013   CLINICAL DATA:  History of liver disease.  Alcohol abuse.  EXAM: PORTABLE CHEST - 1 VIEW  COMPARISON:  05/05/2013 and multiple previous  FINDINGS: Artifact overlies the chest. The patient has taken a poor inspiration. The heart shows mild enlargement. I think there is mild pulmonary edema. No consolidation or collapse. Chronic calcified granulomas and calcified lymph nodes as seen previously.  IMPRESSION: Poor inspiration.  Cardiomegaly.  Mild pulmonary edema.   Electronically Signed   By: Paulina Fusi M.D.   On: 05/10/2013 13:09     EKG Interpretation None     NSR. Rate 64. No ST or T wave changes. Prolonged QT  interval at 525.  MDM   Final diagnoses:  Hypotension  Hypothermia  Prolonged Q-T interval on ECG    Kamela Blansett is a 43 y.o. woman w/ PMH alcohol abuse, liver cirrhosis w/ ascites and esophageal varices (s/p multiple banding procedures last on here was done 03/2013 for UGI bleed), PSC (MRCP 03/2009) who presents with AMS. Differential includes sepsis vs hepatic encephalopathy versus over medication with Xanax, which is a liver metabolized benzodiazepine.  She has hypotension (70/39) and hypothermia (35.3C). Potential sources for sepsis include SBP, pneumonia, UTI. Of note the patient has chronic hypotension, but not necessarily to this degree. When I cared for her a few months ago pressures were consistently in the 80-90s systolic. Will fluid resuscitate, obtain lactate, blood cultures, and treat empirically with vancomycin and ceftriaxone (covers SBP). Checking urine for UA, pregnancy, culture.   Hemoccult obtained and positive. Hemoglobin is 9.9 on her CBC, she has been lower than this on prior labs. No leukocytosis or left shift. Morphology shows burr cells consistent with severe liver disease. Will avoid QTC prolonging drugs, her QT is prolonged at 525.  1:17PM CXR shows cardiomegaly and possibly mild pulmonary edema. She has pitting edema of her lower extremities on exam, this is new since I took care of her in February. Lungs are clear. Checking proBNP. Lactate is normal at 1.47. CMP is remarkable for an AKI, creatinine 2.58 (baseline 0.6-0.8). BUN 24. No anion gap. She has transaminitis in an alcoholic pattern. Total bilirubin is elevated 5.7, stable from last admission in April. Ammonia is 56, high-normal. BP 71/40. Unclear etiology of her hypotension. Continue to bolus with caution given unknown CHF status. Pro-BNP is somewhat elevated at 328. Consult placed to critical care for admission.  Vivi Barrack, MD 05/10/13 [phone]

## 2013-05-10 NOTE — Procedures (Signed)
Arterial Catheter Insertion Procedure Note [name] [id number] 1970/04/01  Procedure: Insertion of Arterial Catheter  Indications: Blood pressure monitoring  Procedure Details Consent: Risks of procedure as well as the alternatives and risks of each were explained to the (patient/caregiver).  Consent for procedure obtained. Time Out: Verified patient identification, verified procedure, site/side was marked, verified correct patient position, special equipment/implants available, medications/allergies/relevent history reviewed, required imaging and test results available.  Performed  Maximum sterile technique was used including antiseptics, cap, gloves, gown, hand hygiene, mask and sheet. Skin prep: Chlorhexidine; local anesthetic administered 20 gauge catheter was inserted into left radial artery using the Seldinger technique.  Evaluation Blood flow good; BP tracing good. Complications: No apparent complications  Arterial line placed at this time per MD order by myself with assistance of [name], RRT/RCP. No complications. Initial BP of 92/27. RT will continue to monitor.    [name] 05/10/2013

## 2013-05-10 NOTE — ED Notes (Addendum)
Ex husband would like to be notified.  He has 427 yo son this weekend.  [phone].  Pt has chosen to have central line and be admitted to critical care.

## 2013-05-10 NOTE — ED Provider Notes (Signed)
I saw and evaluated the patient, reviewed the resident's note and I agree with the findings and plan.   EKG Interpretation   Date/Time:  Friday May 10 2013 11:55:08 EDT Ventricular Rate:  64 PR Interval:  154 QRS Duration: 104 QT Interval:  509 QTC Calculation: 525 R Axis:   52 Text Interpretation:  Sinus rhythm Prolonged QT interval No significant  change since last tracing Confirmed by [facility]  MD, Jonny RuizJOHN ([id number]) on  05/10/2013 6:31:31 PM      Recent admission at external Hospital for GI bleed weakness hypotension confusion; since discharge hypertension weakness confusion continue so patient brought to this emergency department; d/w PCCM for admit.  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 1500  Pt changed mind about code status and [facility] to discuss with PCCM.  CRITICAL CARE Performed by: Hurman HornJohn M Zamzam Whinery Total critical care time: 30min  Critical care time was exclusive of separately billable procedures and treating other patients. Critical care was necessary to treat or prevent imminent or life-threatening deterioration. Critical care was time spent personally by me on the following activities: development of treatment plan with patient and/or surrogate as well as nursing, discussions with consultants, evaluation of patient's response to treatment, examination of patient, obtaining history from patient or surrogate, ordering and performing treatments and interventions, ordering and review of laboratory studies, ordering and review of radiographic studies, pulse oximetry and re-evaluation of patient's condition.  Hurman HornJohn M Jorie Zee, MD 05/13/13 1452

## 2013-05-10 NOTE — ED Notes (Signed)
Pt was [facility]. Pt sts since released from the hospital she hasn't been able to stay awake and its gotten worse. Pt lethargic at triage and slurring speech. Pt very hypotensive at triage 68/33. Pt having significant leg swelling, bruising and pt jaundice. sts she has been vomiting blood.

## 2013-05-10 NOTE — Consult Note (Signed)
PULMONARY / CRITICAL CARE MEDICINE   Name: [name] MRN: [id number] DOB: 26-Dec-1970    ADMISSION DATE:  05/10/2013 CONSULTATION DATE: 05/10/13  REFERRING MD :  [facility] - Dr. Fonnie Jarvis  PRIMARY SERVICE: Medicine   CHIEF COMPLAINT:  Hypotension   BRIEF PATIENT DESCRIPTION: 43 y/o F with multiple medical problems who presented to [facility] on 5/8 with AMS & hypotension in the setting of end stage liver disease.  PCCM consulted for evaluation.    SIGNIFICANT EVENTS / STUDIES:  5/8 - Admit with AMS & hypotension in the setting of end stage liver disease.  LINES / TUBES: R IJ TLC 5/8>>>  CULTURES: UA 5/8 >> many yeast / nitrate positive UC 5/8 >> BCx2 5/8 >>  ANTIBIOTICS: Cefpime 5/8 >> Vanco 5/8 >>  HISTORY OF PRESENT ILLNESS:  43 y/o F with multiple medical problems to include anxiety, depression, ongoing ETOH abuse, ETOH cirrhosis with esophageal varices (s/p multiple banding procedures last on here was done 03/2013 for UGI bleed), and recent admit to [facility] for bleeding varices who presented to [facility] on 5/8 with AMS & hypotension in the setting of end stage liver disease.  Patients boyfriend reports she has been dizzy & weak since discharge.  This has been progressive in nature.  She had AMS issues during that admission and never returned to normal post discharge.  She is chronically on lactulose and this was increased post discharge to TID with Rifaximin discontinued.  She was empirically treated for PNA at that time and was supposed to be on levaquin but the patients 77 year old son accidentally through the Rx out of the window and the hospital reportedly told the family that she could get a new script from her PCP.    He also reports that patient has a "friend" who continues to bring her alcohol.  ER work up demonstrated profound hypotension with BP in the 50-60's systolic.  Na 134, K 3.5, Cr 2.58 (up from 0.64), lactic acid 1.47, BNP, 328, WBC 5.2, Hgb 9.9, platelets of 93, INR 1.86 (not on  anticoagulation), urinalysis with many yeast / nitrate positive, CXR with cardiomegaly and mild vascular congestion.  PCCM called for evaluation.   PAST MEDICAL HISTORY :  Past Medical History  Diagnosis Date  . Sclerosing cholangitis   . Panic attacks   . Anxiety   . Alcohol abuse   . Ascites   . Cirrhosis of liver   . EtOH dependence 08/31/2012  . Family history of anesthesia complication     " my son had problems "  . Depression   . Anemia   . Hepatitis    Past Surgical History  Procedure Laterality Date  . None    . Ercp    . Esophagogastroduodenoscopy N/A 09/10/2012    Procedure: ESOPHAGOGASTRODUODENOSCOPY (EGD);  Surgeon: Florencia Reasons, MD;  Location: [facility] ENDOSCOPY;  Service: Endoscopy;  Laterality: N/A;  . Esophagogastroduodenoscopy N/A 11/04/2012    Procedure: ESOPHAGOGASTRODUODENOSCOPY (EGD);  Surgeon: Barrie Folk, MD;  Location: [facility] ENDOSCOPY;  Service: Endoscopy;  Laterality: N/A;  . Esophagogastroduodenoscopy N/A 03/06/2013    Procedure: ESOPHAGOGASTRODUODENOSCOPY (EGD);  Surgeon: Shirley Friar, MD;  Location: [facility] ENDOSCOPY;  Service: Endoscopy;  Laterality: N/A;  [facility]   Prior to Admission medications   Medication Sig Start Date End Date Taking? Authorizing Provider  buPROPion (WELLBUTRIN XL) 150 MG 24 hr tablet Take 150 mg by mouth daily.    Historical Provider, MD  folic acid (FOLVITE) 800 MCG tablet Take 800  mcg by mouth daily.    Historical Provider, MD  furosemide (LASIX) 20 MG tablet Take 20 mg by mouth daily.    Historical Provider, MD  lactulose (CHRONULAC) 10 GM/15ML solution Take 30 mLs (20 g total) by mouth 2 (two) times daily. 04/07/13   Windell Hummingbird, MD  Multiple Vitamins-Minerals (WOMENS ONE DAILY) TABS Take 1 tablet by mouth daily.    Historical Provider, MD  naproxen sodium (ANAPROX) 220 MG tablet Take 220 mg by mouth daily as needed (for pain).    Historical Provider, MD  pantoprazole (PROTONIX) 40 MG tablet Take 40 mg by mouth 2 (two) times  daily.    Historical Provider, MD  potassium chloride 20 MEQ/15ML (10%) solution Take 20 mEq by mouth daily.     Historical Provider, MD  propranolol (INDERAL) 20 MG tablet Take 20 mg by mouth every morning.     Historical Provider, MD  rifaximin (XIFAXAN) 550 MG TABS tablet Take 550 mg by mouth 2 (two) times daily.    Historical Provider, MD  spironolactone (ALDACTONE) 100 MG tablet Take 100 mg by mouth every morning.     Historical Provider, MD  thiamine (VITAMIN B-1) 100 MG tablet Take 100 mg by mouth daily.    Historical Provider, MD  ursodiol (ACTIGALL) 300 MG capsule Take 300 mg by mouth 2 (two) times daily.    Historical Provider, MD   No Known Allergies  FAMILY HISTORY:  Family History  Problem Relation Age of Onset  . CAD Father    SOCIAL HISTORY:  reports that she has been smoking Cigarettes.  She has a 10 pack-year smoking history. She has never used smokeless tobacco. She reports that she uses illicit drugs (Marijuana). She reports that she does not drink alcohol.  REVIEW OF SYSTEMS:  Unable to complete as patient is altered.   SUBJECTIVE:   VITAL SIGNS: Temp:  [95.5 F (35.3 C)-97.7 F (36.5 C)] 95.5 F (35.3 C) (05/08 1212) Pulse Rate:  [65-81] 72 (05/08 1415) Resp:  [9-18] 10 (05/08 1415) BP: (68-74)/(20-40) 70/37 mmHg (05/08 1415) SpO2:  [92 %-100 %] 92 % (05/08 1415)  INTAKE / OUTPUT: Intake/Output   None     PHYSICAL EXAMINATION: General:  Acutely ill Neuro:  Awakens, able to answer place & time, weak but moves all ext's  HEENT:  Sclera icterus, mm pink/dry Cardiovascular:  s1s2 rrr, no m/r/g Lungs:  resp's shallow, non-labored, lungs bilaterally diminished Abdomen:  Obese, soft, bsx4 hypoactive  Musculoskeletal:  No acute deformities  Skin:  Cool, dry, jaundice, telangectasia's on chest wall   LABS:  CBC  Recent Labs Lab 05/10/13 1158  WBC 5.2  HGB 9.9*  HCT 30.2*  PLT 93*   Coag's  Recent Labs Lab 05/10/13 1156  INR 1.86*    BMET  Recent Labs Lab 05/10/13 1156  NA 134*  K 3.5*  CL 95*  CO2 27  BUN 24*  CREATININE 2.58*  GLUCOSE 97   Electrolytes  Recent Labs Lab 05/10/13 1156  CALCIUM 8.3*   Sepsis Markers  Recent Labs Lab 05/10/13 1243  LATICACIDVEN 1.47   ABG No results found for this basename: PHART, PCO2ART, PO2ART,  in the last 168 hours Liver Enzymes  Recent Labs Lab 05/10/13 1156  AST 71*  ALT 23  ALKPHOS 277*  BILITOT 5.7*  ALBUMIN 2.2*   Cardiac Enzymes  Recent Labs Lab 05/10/13 1317  PROBNP 328.6*   Glucose No results found for this basename: GLUCAP,  in the last 168 hours  Imaging Dg Chest Portable 1 View  05/10/2013   CLINICAL DATA:  History of liver disease.  Alcohol abuse.  EXAM: PORTABLE CHEST - 1 VIEW  COMPARISON:  05/05/2013 and multiple previous  FINDINGS: Artifact overlies the chest. The patient has taken a poor inspiration. The heart shows mild enlargement. I think there is mild pulmonary edema. No consolidation or collapse. Chronic calcified granulomas and calcified lymph nodes as seen previously.  IMPRESSION: Poor inspiration.  Cardiomegaly.  Mild pulmonary edema.   Electronically Signed   By: Paulina FusiMark  Shogry M.D.   On: 05/10/2013 13:09    ASSESSMENT / PLAN:  End Stage Liver Disease Esophageal Varices - with recent GIB ETOH Cirrhosis with Ascites Hypotension  Multi-System Organ Failure Acute Kidney Injury   43 y/o F with PMH of alcoholic cirrhosis with varices and [facility] who presents 5/8 with AMS & profound hypotension.  She unfortunately is in multi-system organ failure.  Extensive discussion with boyfriend at bedside [facility](POA) and he agrees that patient would not want aggressive measures given overall state of decline.  He had agreed no shock / defib, intubation, central line or vasopressors.  He is agreeable to oxygen, IVF's, antibiotics / supportive care.  If she declines further, would want comfort measures.     However, patient suddenly woke up 2 hours later after treatment with IVF and improvement in BP marginally and demanded that everything be done.  I was very frank with her that intubation/central line/CPR/cardioversion would be futile if it gets to that point but she insisted that all be done knowing the complications of said procedure.    Pulmonary: hypoxic failure with increase in pulmonary edema and ? Of hepatopulmonary syndrome. Plan: - Supplemental O2.  - If deteriorates then will intubate per patient's wishes.  - IS per RT protocol.  Cardiac: Baseline SBP of 80s.  Patient was in [place] treated for pneumonia but did not finish the course of abx so likely in septic shock. Plan: - Insert TLC.  - Sepsis protocol.  - Follow CVP.  - Levophed.  - See ID section.  Renal: Acute renal failure with hypokalemia.  Likely hepatorenal syndrome. Plan: - Replace K.  - BMET in AM.  GI: Alcoholic liver failure with cirrhosis.  Acute on chronic deterioration.  Continues to actively drink.  Not a transplant candidate. Plan: - Lactulose.  - LFTs in AM.  - Ammonia level now and in AM.   Neuro: deterioration due to sepsis and hyperammonemia.  Evidently does not drink for a week at a time then will drink a quart of vodka in one night.   Plan: - Lactulose.  - Monitor with CIWA but from history reported doubt withdrawal.  - Ammonia level in AM.  - If withdrawal then will consider precedex.  ID: HCAP, did not finish course of treatment in [place]. Plan: - Pan culture.  - Cefepime and vanc.  - Sepsis protocol.  Endocrine: Not diabetic. Plan: - Check cortisol level.  - Stress dose steroids.  I have personally obtained a history, examined the patient, evaluated laboratory and imaging results, formulated the assessment and plan and placed orders.  CRITICAL CARE: The patient is critically ill with multiple organ systems failure and requires high complexity decision making for assessment and  support, frequent evaluation and titration of therapies, application of advanced monitoring technologies and extensive interpretation of multiple databases. Critical Care Time devoted to patient care services described in this note is 80 minutes.   Wesam G.  Molli KnockYacoub, M.D. [facility] Pulmonary/Critical Care Medicine. Pager: [phone]. After hours pager: [phone].  05/10/2013, 2:31 PM

## 2013-05-11 ENCOUNTER — Inpatient Hospital Stay (HOSPITAL_COMMUNITY): Payer: Medicaid Other

## 2013-05-11 DIAGNOSIS — A419 Sepsis, unspecified organism: Secondary | ICD-10-CM

## 2013-05-11 DIAGNOSIS — D696 Thrombocytopenia, unspecified: Secondary | ICD-10-CM

## 2013-05-11 DIAGNOSIS — D649 Anemia, unspecified: Secondary | ICD-10-CM

## 2013-05-11 LAB — BASIC METABOLIC PANEL
BUN: 22 mg/dL (ref 6–23)
CO2: 18 mEq/L — ABNORMAL LOW (ref 19–32)
Calcium: 7.7 mg/dL — ABNORMAL LOW (ref 8.4–10.5)
Chloride: 107 mEq/L (ref 96–112)
Creatinine, Ser: 1.46 mg/dL — ABNORMAL HIGH (ref 0.50–1.10)
GFR calc Af Amer: 49 mL/min — ABNORMAL LOW (ref >90)
GFR, EST NON AFRICAN AMERICAN: 43 mL/min — AB (ref >90)
Glucose, Bld: 102 mg/dL — ABNORMAL HIGH (ref 70–99)
POTASSIUM: 3.4 meq/L — AB (ref 3.7–5.3)
SODIUM: 138 meq/L (ref 137–147)

## 2013-05-11 LAB — HEPATIC FUNCTION PANEL
ALBUMIN: 1.8 g/dL — AB (ref 3.5–5.2)
ALBUMIN: 1.7 g/dL — AB (ref 3.5–5.2)
ALK PHOS: 231 U/L — AB (ref 39–117)
ALT: 19 U/L (ref 0–35)
ALT: 20 U/L (ref 0–35)
AST: 62 U/L — AB (ref 0–37)
AST: 68 U/L — ABNORMAL HIGH (ref 0–37)
Alkaline Phosphatase: 235 U/L — ABNORMAL HIGH (ref 39–117)
BILIRUBIN DIRECT: 3.7 mg/dL — AB (ref 0.0–0.3)
Bilirubin, Direct: 3.7 mg/dL — ABNORMAL HIGH (ref 0.0–0.3)
Indirect Bilirubin: 0.8 mg/dL (ref 0.3–0.9)
Indirect Bilirubin: 1.3 mg/dL — ABNORMAL HIGH (ref 0.3–0.9)
TOTAL PROTEIN: 4.9 g/dL — AB (ref 6.0–8.3)
Total Bilirubin: 4.5 mg/dL — ABNORMAL HIGH (ref 0.3–1.2)
Total Bilirubin: 5 mg/dL — ABNORMAL HIGH (ref 0.3–1.2)
Total Protein: 4.8 g/dL — ABNORMAL LOW (ref 6.0–8.3)

## 2013-05-11 LAB — AMMONIA: Ammonia: 83 umol/L — ABNORMAL HIGH (ref 11–60)

## 2013-05-11 LAB — CBC
HEMATOCRIT: 28.7 % — AB (ref 36.0–46.0)
HEMOGLOBIN: 9.3 g/dL — AB (ref 12.0–15.0)
MCH: 31.6 pg (ref 26.0–34.0)
MCHC: 32.4 g/dL (ref 30.0–36.0)
MCV: 97.6 fL (ref 78.0–100.0)
Platelets: 86 10*3/uL — ABNORMAL LOW (ref 150–400)
RBC: 2.94 MIL/uL — ABNORMAL LOW (ref 3.87–5.11)
RDW: 21.3 % — ABNORMAL HIGH (ref 11.5–15.5)
WBC: 6.5 10*3/uL (ref 4.0–10.5)

## 2013-05-11 LAB — URINE CULTURE
Colony Count: NO GROWTH
Culture: NO GROWTH

## 2013-05-11 LAB — MAGNESIUM: Magnesium: 1.6 mg/dL (ref 1.5–2.5)

## 2013-05-11 LAB — CARBOXYHEMOGLOBIN
Carboxyhemoglobin: 2 % — ABNORMAL HIGH (ref 0.5–1.5)
METHEMOGLOBIN: 0.7 % (ref 0.0–1.5)
O2 SAT: 81.8 %
TOTAL HEMOGLOBIN: 8 g/dL — AB (ref 12.0–16.0)

## 2013-05-11 LAB — PROTIME-INR
INR: 1.85 — AB (ref 0.00–1.49)
Prothrombin Time: 20.8 seconds — ABNORMAL HIGH (ref 11.6–15.2)

## 2013-05-11 LAB — PHOSPHORUS: Phosphorus: 3.9 mg/dL (ref 2.3–4.6)

## 2013-05-11 MED ORDER — SODIUM CHLORIDE 0.9 % IV BOLUS (SEPSIS)
500.0000 mL | Freq: Once | INTRAVENOUS | Status: AC
Start: 2013-05-11 — End: 2013-05-11
  Administered 2013-05-11: 500 mL via INTRAVENOUS

## 2013-05-11 MED ORDER — ONDANSETRON HCL 4 MG/2ML IJ SOLN
4.0000 mg | Freq: Four times a day (QID) | INTRAMUSCULAR | Status: DC | PRN
  Administered 2013-05-15 – 2013-05-16 (×2): 4 mg via INTRAVENOUS
  Filled 2013-05-11 (×2): qty 2

## 2013-05-11 MED ORDER — FOLIC ACID 5 MG/ML IJ SOLN
1.0000 mg | Freq: Every day | INTRAMUSCULAR | Status: DC
  Administered 2013-05-11 – 2013-05-12 (×2): 1 mg via INTRAVENOUS
  Filled 2013-05-11 (×3): qty 0.2

## 2013-05-11 MED ORDER — ENSURE COMPLETE PO LIQD: 237.0000 mL | Freq: Three times a day (TID) | ORAL | Status: DC

## 2013-05-11 MED ORDER — DEXTROSE 5 % IV SOLN
1.0000 g | INTRAVENOUS | Status: AC
  Administered 2013-05-11: 1 g via INTRAVENOUS
  Filled 2013-05-11: qty 10

## 2013-05-11 MED ORDER — SODIUM CHLORIDE 0.9 % IV BOLUS (SEPSIS)
500.0000 mL | Freq: Once | INTRAVENOUS | Status: AC
  Administered 2013-05-11: 500 mL via INTRAVENOUS

## 2013-05-11 MED ORDER — THIAMINE HCL 100 MG/ML IJ SOLN
100.0000 mg | Freq: Every day | INTRAMUSCULAR | Status: DC
  Administered 2013-05-11 – 2013-05-12 (×2): 100 mg via INTRAVENOUS
  Filled 2013-05-11 (×2): qty 1

## 2013-05-11 MED ORDER — HYDROCORTISONE NA SUCCINATE PF 100 MG IJ SOLR
50.0000 mg | Freq: Four times a day (QID) | INTRAMUSCULAR | Status: DC
  Administered 2013-05-11 – 2013-05-13 (×9): 50 mg via INTRAVENOUS
  Filled 2013-05-11 (×12): qty 1

## 2013-05-11 MED ORDER — PANTOPRAZOLE SODIUM 40 MG IV SOLR
40.0000 mg | INTRAVENOUS | Status: DC
  Administered 2013-05-11: 40 mg via INTRAVENOUS
  Filled 2013-05-11 (×2): qty 40

## 2013-05-11 MED ORDER — DEXTROSE 5 % IV SOLN
2.0000 ug/min | INTRAVENOUS | Status: DC
  Administered 2013-05-11: 4 ug/min via INTRAVENOUS
  Filled 2013-05-11: qty 16

## 2013-05-11 MED ORDER — SODIUM CHLORIDE 0.9 % IV SOLN
INTRAVENOUS | Status: DC
  Administered 2013-05-11: 1000 mL via INTRAVENOUS
  Administered 2013-05-11: 02:00:00 via INTRAVENOUS

## 2013-05-11 MED ORDER — ONDANSETRON HCL 4 MG/2ML IJ SOLN: 4.0000 mg | Freq: Four times a day (QID) | INTRAMUSCULAR | Status: DC

## 2013-05-11 MED ORDER — TRAMADOL HCL 50 MG PO TABS
50.0000 mg | ORAL_TABLET | Freq: Four times a day (QID) | ORAL | Status: DC | PRN
  Administered 2013-05-11 – 2013-05-17 (×8): 50 mg via ORAL
  Filled 2013-05-11 (×9): qty 1

## 2013-05-11 NOTE — Consult Note (Addendum)
PULMONARY / CRITICAL CARE MEDICINE  Name: [name] MRN: [id number] DOB: [birth year]    ADMISSION DATE:  05/10/2013 CONSULTATION DATE: 05/10/2013  REFERRING MD :  [facility]P PRIMARY SERVICE: Medicine   CHIEF COMPLAINT:  Hypotension   BRIEF PATIENT DESCRIPTION: 43 yo with history of alcohol abuse and alcoholic cirrhosis admitted 5/8 with acute encephalopathy and hypotension.  SIGNIFICANT EVENTS / STUDIES:   LINES / TUBES: L IJ TLC 5/8 >>>  CULTURES: 5/8  MRSA PCR >>> neg 5/8  Urine >>> 5/8  Blood >>>  ANTIBIOTICS: Cefpime 5/8 >>> 5/9 Vanco 5/8 >>> 5/9 Ceftriaxone 5/9 >>>  SUBJECTIVE: On low dose vasopressors.  VITAL SIGNS: Temp:  [95.5 F (35.3 C)-97.8 F (36.6 C)] 97.4 F (36.3 C) (05/09 0752) Pulse Rate:  [58-81] 61 (05/09 1000) Resp:  [6-19] 10 (05/09 1000) BP: (54-110)/(20-59) 86/54 mmHg (05/09 1000) SpO2:  [91 %-100 %] 100 % (05/09 1000) Arterial Line BP: (71-115)/(23-59) 100/51 mmHg (05/09 1000) Weight:  [63.504 kg (140 lb)] 63.504 kg (140 lb) (05/08 1643)  INTAKE / OUTPUT: Intake/Output     05/08 0701 - 05/09 0700 05/09 0701 - 05/10 0700   I.V. (mL/kg) 7012 (110.4) 209 (3.3)   Total Intake(mL/kg) 7012 (110.4) 209 (3.3)   Urine (mL/kg/hr) 350 75 (0.3)   Total Output 350 75   Net +6662 +134        Stool Occurrence 1 x      PHYSICAL EXAMINATION: General:  Acutely ill Neuro: Awake and follows commands HEENT:  Sclera icterus, mm pink/dry Cardiovascular:  s1s2 rrr, no m/r/g Lungs:  resp's shallow, non-labored, lungs bilaterally diminished Abdomen:  Obese, soft, bsx4 hypoactive  Musculoskeletal:  No acute deformities  Skin:  Cool, dry, jaundice, telangectasia's on chest wall   LABS:  CBC  Recent Labs Lab 05/10/13 1158 05/11/13 0400  WBC 5.2 6.5  HGB 9.9* 9.3*  HCT 30.2* 28.7*  PLT 93* 86*   Coag's  Recent Labs Lab 05/10/13 1156 05/10/13 1627 05/11/13 0400  APTT  --  48*  --   INR 1.86*  --  1.85*   BMET  Recent Labs Lab 05/10/13 1156   NA 134*  K 3.5*  CL 95*  CO2 27  BUN 24*  CREATININE 2.58*  GLUCOSE 97   Electrolytes  Recent Labs Lab 05/10/13 1156 05/11/13 0400  CALCIUM 8.3*  --   MG  --  1.6  PHOS  --  3.9   Sepsis Markers  Recent Labs Lab 05/10/13 1243 05/10/13 1627  LATICACIDVEN 1.47 0.9   ABG No results found for this basename: PHART, PCO2ART, PO2ART,  in the last 168 hours Liver Enzymes  Recent Labs Lab 05/10/13 1156 05/11/13 0400  AST 71* 62*  ALT 23 19  ALKPHOS 277* 231*  BILITOT 5.7* 4.5*  ALBUMIN 2.2* 1.8*   Cardiac Enzymes  Recent Labs Lab 05/10/13 1317 05/10/13 1627  TROPONINI  --  <0.30  PROBNP 328.6*  --    Glucose  Recent Labs Lab 05/10/13 2117  GLUCAP 86   IMAGING:  [place] Abdomen Complete  05/10/2013   CLINICAL DATA:  Abnormal liver function enzymes, sclerosing cholangitis, hepatitis, cirrhosis, alcohol abuse  EXAM: ULTRASOUND ABDOMEN COMPLETE  COMPARISON:  US ABDOMEN COMPLETE dated 04/07/2013  FINDINGS: Gallbladder:  There is significant wall thickening to 6 mm. The gallbladder lumen is poorly visualized but there does appear to be debris within it. There is no Murphy's sign.  Common bile duct:  Diameter: 6 mm  Liver:  Heterogeneous attenuation with nodular  contours consistent with known cirrhosis  IVC:  No abnormality visualized.  Pancreas:  Not visualized  Spleen:  Mildly enlarged at 13 cm  Right Kidney:  Length: 13.1 cm. Echogenicity within normal limits. No mass or hydronephrosis visualized.  Left Kidney:  Length: 14.1 cm. Echogenicity within normal limits. No mass or hydronephrosis visualized.  Abdominal aorta:  Not visualized  Other findings:  There is a small volume of ascites right upper quadrant, as well as in the pelvis. There is also evidence to suggest the possibility of portal vein thrombosis, with inability to demonstrate vascular flow within the main portal vein.  IMPRESSION: 1. Abnormal gallbladder with significant wall thickening and sludge within the  gallbladder lumen but no sonographic Murphy sign 2. Small volume of ascites 3. Possible portal vein thrombosis. 4. Liver appearance consistent with cirrhosis   Electronically Signed   By: Esperanza Heir M.D.   On: 05/10/2013 19:01   Dg Chest Portable 1 View  05/10/2013   CLINICAL DATA:  Sepsis and left-sided central venous catheter placement.  EXAM: PORTABLE CHEST - 1 VIEW  COMPARISON:  Film earlier today at 1253 hr  FINDINGS: New left jugular central venous catheter has been placed with the catheter tip at the cavoatrial junction. No pneumothorax. Lungs show low volumes with bibasilar atelectasis and no overt edema or focal airspace consolidation.  The heart size and mediastinal contours are stable. Calcified mediastinal and hilar lymph nodes again noted.  IMPRESSION: Left jugular central venous catheter tip at the cavoatrial junction. No pneumothorax is identified.   Electronically Signed   By: Irish Lack M.D.   On: 05/10/2013 17:08   Dg Chest Portable 1 View  05/10/2013   CLINICAL DATA:  History of liver disease.  Alcohol abuse.  EXAM: PORTABLE CHEST - 1 VIEW  COMPARISON:  05/05/2013 and multiple previous  FINDINGS: Artifact overlies the chest. The patient has taken a poor inspiration. The heart shows mild enlargement. I think there is mild pulmonary edema. No consolidation or collapse. Chronic calcified granulomas and calcified lymph nodes as seen previously.  IMPRESSION: Poor inspiration.  Cardiomegaly.  Mild pulmonary edema.   Electronically Signed   By: Paulina Fusi M.D.   On: 05/10/2013 13:09    ASSESSMENT / PLAN:  PULMONARY A:   Acute hypoxemic respiratory failure in setting of suspected pulmonary edema, resolved P:   Goal SpO2>92 Supplemental oxygen  CARDIOVASCULAR A:  Sepsis in setting of UTI and adrenal insufficiency, lactate reassuring Baseline hypotension? P:  Goal SBP>100 Levophed gtt, titrate to off  RENAL A:  AKI P:   Trend BMP NS 500 x  1 Ns@100   GASTROINTESTINAL A:   Nutrition GI Px P:   Diet Add Protonix for GI Px Lactulose  HEMATOLOGIC A:   Coagulopathy / thrombocytopenia in setting of liver disease VTE Px P:  Trend CBC Add SCD  INFECTIOUS A:   UTI At risk for SBP Presumed HCAP on transfer but no significant airspace disease and leukocytosis P:   Cultures and antibiotics as above D/c Vancomycin  ENDOCRINE  A:   Adrenal insufficiency ( cortisol 3.8 in setting of hypotension ) P:   Start Hydrocortisone stress dose  NEUROLOGIC A:   No evidence of alcohol witdrawal P:   D/c Ativan / CIWA Thiamin / Folate  Brett Canales Minor ACNP [facility] PCCM Pager [phone] till 3 pm If no answer page [phone] 05/11/2013, 10:36 AM  I have personally obtained history, examined patient, evaluated and interpreted laboratory and imaging results, reviewed medical  records, formulated assessment / plan and placed orders.  CRITICAL CARE:  The patient is critically ill with multiple organ systems failure and requires high complexity decision making for assessment and support, frequent evaluation and titration of therapies, application of advanced monitoring technologies and extensive interpretation of multiple databases. Critical Care Time devoted to patient care services described in this note is 35 minutes.   Lonia FarberKonstantin Cornelius Schuitema, MD Pulmonary and Critical Care Medicine [facility] Pager: [phone]  05/11/2013, 12:32 PM

## 2013-05-11 NOTE — Progress Notes (Signed)
INITIAL NUTRITION ASSESSMENT  DOCUMENTATION CODES Per approved criteria  -Not Applicable   INTERVENTION: -Recommend Ensure Complete po BID, each supplement provides 350 kcal and 13 grams of protein -Diet advancement per MD -Will continue to monitor  NUTRITION DIAGNOSIS: Inadequate oral intake related to inabilty as evidenced by NPO status.   Goal: Pt to meet >/= 90% of their estimated nutrition needs    Monitor:  Diet advancement, total protein/energy intake, labs, weights,  Reason for Assessment: MST  43 y.o. female  Admitting Dx: <principal problem not specified>  ASSESSMENT: 43 y/o F with multiple medical problems to include anxiety, depression, ongoing ETOH abuse, ETOH cirrhosis with esophageal varices (s/p multiple banding procedures last on here was done 03/2013 for UGI bleed), and [facility] varices who [facility] with AMS & hypotension in the setting of end stage liver disease.  -Pt NPO d/t sepsis protocol -Pt drowsy, unable to provide responses to attain food/nutrition hx -Weight has fluctuated, but previous medical records do indicate a 15-20 lbs wt loss in past 3 months -Ongoing ETOH abuse and esophageal varices likely contribute to suboptimal nutrition -RN reported pt still likely too lethargic for solid foods, pending liquid diet advancement -Would benefit from supplementation -Will monitor tolerance/PO intake and modify as warranted -AMS r/t elevated ammonia. Receiving lactulose -Elevated LFT   Height: Ht Readings from Last 1 Encounters:  05/10/13 5\' 4"  (1.626 m)    Weight: Wt Readings from Last 1 Encounters:  05/10/13 140 lb (63.504 kg)    Ideal Body Weight: 120 lbs  % Ideal Body Weight: 117%  Wt Readings from Last 10 Encounters:  05/10/13 140 lb (63.504 kg)  04/06/13 168 lb (76.204 kg)  03/09/13 168 lb 11.2 oz (76.522 kg)  03/09/13 168 lb 11.2 oz (76.522 kg)  02/19/13 172 lb 8 oz (78.245 kg)   02/01/13 156 lb 15.5 oz (71.2 kg)  11/07/12 151 lb 10.8 oz (68.8 kg)  11/07/12 151 lb 10.8 oz (68.8 kg)  09/14/12 153 lb 3.5 oz (69.5 kg)  09/14/12 153 lb 3.5 oz (69.5 kg)    Usual Body Weight: 155-165 per previous med records  % Usual Body Weight: 85-90%  BMI:  Body mass index is 24.02 kg/(m^2).  Estimated Nutritional Needs: Kcal: 1900-2100 Protein: 85-95 gram Fluid: 1.9-2.1 L  Skin: + 3 edema on RLE and LLE  Diet Order: Full Liquid  EDUCATION NEEDS: -No education needs identified at this time   Intake/Output Summary (Last 24 hours) at 05/11/13 1220 Last data filed at 05/11/13 1200  Gross per 24 hour  Intake 7720.91 ml  Output    625 ml  Net 7095.91 ml    Last BM: 5/09   Labs:   Recent Labs Lab 05/10/13 1156 05/11/13 0400  NA 134*  --   K 3.5*  --   CL 95*  --   CO2 27  --   BUN 24*  --   CREATININE 2.58*  --   CALCIUM 8.3*  --   MG  --  1.6  PHOS  --  3.9  GLUCOSE 97  --     CBG (last 3)   Recent Labs  05/10/13 2117  GLUCAP 86    Scheduled Meds: . ceFEPime (MAXIPIME) IV  1 g Intravenous Q24H  . folic acid  1 mg Intravenous Daily  . hydrocortisone sod succinate (SOLU-CORTEF) inj  50 mg Intravenous Q6H  . lactulose  30 g Oral TID  . sodium  chloride  500 mL Intravenous Once  . thiamine IV  100 mg Intravenous Daily  . vancomycin  1,000 mg Intravenous Q24H    Continuous Infusions: . sodium chloride 1,000 mL (05/11/13 1212)  . norepinephrine (LEVOPHED) Adult infusion 4 mcg/min (05/11/13 1159)    Past Medical History  Diagnosis Date  . Sclerosing cholangitis   . Panic attacks   . Anxiety   . Alcohol abuse   . Ascites   . Cirrhosis of liver   . EtOH dependence 08/31/2012  . Family history of anesthesia complication     " my son had problems "  . Depression   . Anemia   . Hepatitis     Past Surgical History  Procedure Laterality Date  . None    . Ercp    . Esophagogastroduodenoscopy N/A 09/10/2012    Procedure:  ESOPHAGOGASTRODUODENOSCOPY (EGD);  Surgeon: Florencia Reasonsobert V Buccini, MD;  [facility];  Service: Endoscopy;  Laterality: N/A;  . Esophagogastroduodenoscopy N/A 11/04/2012    Procedure: ESOPHAGOGASTRODUODENOSCOPY (EGD);  Surgeon: Barrie FolkJohn C Hayes, MD;  [facility];  Service: Endoscopy;  Laterality: N/A;  . Esophagogastroduodenoscopy N/A 03/06/2013    Procedure: ESOPHAGOGASTRODUODENOSCOPY (EGD);  Surgeon: Shirley FriarVincent C. Schooler, MD;  [facility] END[facility];  Service: Endoscopy;  Laterality: N/A;  [facility]    Lloyd HugerSarah F Tiona Ruane MS RD LDN Clinical Dietitian [phone]

## 2013-05-12 LAB — BASIC METABOLIC PANEL
BUN: 20 mg/dL (ref 6–23)
CALCIUM: 7.8 mg/dL — AB (ref 8.4–10.5)
CO2: 20 mEq/L (ref 19–32)
Chloride: 110 mEq/L (ref 96–112)
Creatinine, Ser: 0.8 mg/dL (ref 0.50–1.10)
GFR calc Af Amer: >90 mL/min (ref >90)
GFR calc non Af Amer: 88 mL/min — ABNORMAL LOW (ref >90)
Glucose, Bld: 216 mg/dL — ABNORMAL HIGH (ref 70–99)
Potassium: 3.3 mEq/L — ABNORMAL LOW (ref 3.7–5.3)
SODIUM: 141 meq/L (ref 137–147)

## 2013-05-12 LAB — CBC
HCT: 27.4 % — ABNORMAL LOW (ref 36.0–46.0)
Hemoglobin: 9.1 g/dL — ABNORMAL LOW (ref 12.0–15.0)
MCH: 32 pg (ref 26.0–34.0)
MCHC: 33.2 g/dL (ref 30.0–36.0)
MCV: 96.5 fL (ref 78.0–100.0)
Platelets: 98 10*3/uL — ABNORMAL LOW (ref 150–400)
RBC: 2.84 MIL/uL — AB (ref 3.87–5.11)
RDW: 20.9 % — ABNORMAL HIGH (ref 11.5–15.5)
WBC: 7.7 10*3/uL (ref 4.0–10.5)

## 2013-05-12 LAB — AMMONIA: Ammonia: 141 umol/L — ABNORMAL HIGH (ref 11–60)

## 2013-05-12 LAB — GLUCOSE, CAPILLARY
Glucose-Capillary: 233 mg/dL — ABNORMAL HIGH (ref 70–99)
Glucose-Capillary: 216 mg/dL — ABNORMAL HIGH (ref 70–99)

## 2013-05-12 LAB — HEMOGLOBIN A1C
HEMOGLOBIN A1C: 6.2 % — AB (ref <5.7)
MEAN PLASMA GLUCOSE: 131 mg/dL — AB (ref <117)

## 2013-05-12 MED ORDER — MIDODRINE HCL 5 MG PO TABS
10.0000 mg | ORAL_TABLET | Freq: Three times a day (TID) | ORAL | Status: DC
  Administered 2013-05-12 – 2013-05-17 (×14): 10 mg via ORAL
  Filled 2013-05-12 (×18): qty 2

## 2013-05-12 MED ORDER — INSULIN ASPART 100 UNIT/ML ~~LOC~~ SOLN
0.0000 [IU] | Freq: Three times a day (TID) | SUBCUTANEOUS | Status: DC
  Administered 2013-05-12 – 2013-05-13 (×3): 3 [IU] via SUBCUTANEOUS
  Administered 2013-05-13: 2 [IU] via SUBCUTANEOUS
  Administered 2013-05-13: 3 [IU] via SUBCUTANEOUS
  Administered 2013-05-14: 2 [IU] via SUBCUTANEOUS
  Administered 2013-05-14: 3 [IU] via SUBCUTANEOUS
  Administered 2013-05-14: 1 [IU] via SUBCUTANEOUS
  Administered 2013-05-15: 3 [IU] via SUBCUTANEOUS
  Administered 2013-05-16 – 2013-05-17 (×3): 2 [IU] via SUBCUTANEOUS

## 2013-05-12 MED ORDER — VITAMIN B-1 100 MG PO TABS
100.0000 mg | ORAL_TABLET | Freq: Every day | ORAL | Status: DC
  Administered 2013-05-13 – 2013-05-17 (×5): 100 mg via ORAL
  Filled 2013-05-12 (×6): qty 1

## 2013-05-12 MED ORDER — PANTOPRAZOLE SODIUM 40 MG PO TBEC
40.0000 mg | DELAYED_RELEASE_TABLET | Freq: Every day | ORAL | Status: DC
  Administered 2013-05-12: 40 mg via ORAL
  Filled 2013-05-12: qty 1

## 2013-05-12 MED ORDER — FOLIC ACID 1 MG PO TABS
1.0000 mg | ORAL_TABLET | Freq: Every day | ORAL | Status: DC
  Administered 2013-05-13 – 2013-05-17 (×5): 1 mg via ORAL
  Filled 2013-05-12 (×6): qty 1

## 2013-05-12 MED ORDER — POTASSIUM CHLORIDE CRYS ER 20 MEQ PO TBCR
40.0000 meq | EXTENDED_RELEASE_TABLET | ORAL | Status: AC
  Administered 2013-05-12 (×2): 40 meq via ORAL
  Filled 2013-05-12 (×2): qty 2

## 2013-05-12 NOTE — Progress Notes (Signed)
UR Completed.  Tia Gelb Jane Simrah Chatham [phone] 05/12/2013  

## 2013-05-12 NOTE — Progress Notes (Signed)
PULMONARY / CRITICAL CARE MEDICINE  Name: [name] MRN: [id number] DOB: 07-Apr-1970    ADMISSION DATE:  05/10/2013 CONSULTATION DATE: 05/10/2013  REFERRING MD :  EDP PRIMARY SERVICE: Medicine   CHIEF COMPLAINT:  Hypotension   BRIEF PATIENT DESCRIPTION: 43 yo with history of alcohol abuse and alcoholic cirrhosis admitted 5/8 with acute encephalopathy and hypotension.  SIGNIFICANT EVENTS / STUDIES:   LINES / TUBES: L IJ TLC 5/8 >>> 5/10 L R AL 5/8 >>> 5/10  CULTURES: 5/8  MRSA PCR >>> neg 5/8  Urine >>> neg 5/8  Blood >>>  ANTIBIOTICS: Cefpime 5/8 >>> 5/9 Vanco 5/8 >>> 5/9 Ceftriaxone 5/9 >>>  SUBJECTIVE: No overnight issues  VITAL SIGNS: Temp:  [97.6 F (36.4 C)-98.3 F (36.8 C)] 97.8 F (36.6 C) (05/10 0700) Pulse Rate:  [59-80] 73 (05/10 0600) Resp:  [9-22] 12 (05/10 0600) BP: (68-100)/(33-60) 100/48 mmHg (05/10 0600) SpO2:  [97 %-100 %] 100 % (05/10 0600) Arterial Line BP: (79-137)/(37-59) 119/47 mmHg (05/10 0600)  INTAKE / OUTPUT: Intake/Output     05/09 0701 - 05/10 0700 05/10 0701 - 05/11 0700   I.V. (mL/kg) 2226.9 (35.1)    IV Piggyback 550    Total Intake(mL/kg) 2776.9 (43.7)    Urine (mL/kg/hr) 885 (0.6)    Stool 6 (0)    Total Output 891     Net +1885.9            PHYSICAL EXAMINATION: General:  Appears less stressed Neuro: Awake and follows commands HEENT:  Sclera icterus, mm pink/dry Cardiovascular:  s1s2 rrr, no m/r/g Lungs:  resp's shallow, non-labored, lungs bilaterally diminished Abdomen:  Obese, soft, bsx4 hypoactive  Musculoskeletal:  No acute deformities  Skin:  Cool, dry, jaundice, telangectasia's on chest wall   LABS:  CBC  Recent Labs Lab 05/10/13 1158 05/11/13 0400 05/12/13 0600  WBC 5.2 6.5 7.7  HGB 9.9* 9.3* 9.1*  HCT 30.2* 28.7* 27.4*  PLT 93* 86* 98*   Coag's  Recent Labs Lab 05/10/13 1156 05/10/13 1627 05/11/13 0400  APTT  --  48*  --   INR 1.86*  --  1.85*   BMET  Recent Labs Lab 05/10/13 1156  05/11/13 1155 05/12/13 0600  NA 134* 138 141  K 3.5* 3.4* 3.3*  CL 95* 107 110  CO2 27 18* 20  BUN 24* 22 20  CREATININE 2.58* 1.46* 0.80  GLUCOSE 97 102* 216*   Electrolytes  Recent Labs Lab 05/10/13 1156 05/11/13 0400 05/11/13 1155 05/12/13 0600  CALCIUM 8.3*  --  7.7* 7.8*  MG  --  1.6  --   --   PHOS  --  3.9  --   --    Sepsis Markers  Recent Labs Lab 05/10/13 1243 05/10/13 1627  LATICACIDVEN 1.47 0.9   ABG No results found for this basename: PHART, PCO2ART, PO2ART,  in the last 168 hours Liver Enzymes  Recent Labs Lab 05/10/13 1156 05/11/13 0400 05/11/13 1155  AST 71* 62* 68*  ALT 23 19 20   ALKPHOS 277* 231* 235*  BILITOT 5.7* 4.5* 5.0*  ALBUMIN 2.2* 1.8* 1.7*   Cardiac Enzymes  Recent Labs Lab 05/10/13 1317 05/10/13 1627  TROPONINI  --  <0.30  PROBNP 328.6*  --    Glucose  Recent Labs Lab 05/10/13 2117  GLUCAP 86   IMAGING:  [place] Abdomen Complete  05/10/2013   CLINICAL DATA:  Abnormal liver function enzymes, sclerosing cholangitis, hepatitis, cirrhosis, alcohol abuse  EXAM: ULTRASOUND ABDOMEN COMPLETE  COMPARISON:  US ABDOMEN  COMPLETE dated 04/07/2013  FINDINGS: Gallbladder:  There is significant wall thickening to 6 mm. The gallbladder lumen is poorly visualized but there does appear to be debris within it. There is no Murphy's sign.  Common bile duct:  Diameter: 6 mm  Liver:  Heterogeneous attenuation with nodular contours consistent with known cirrhosis  IVC:  No abnormality visualized.  Pancreas:  Not visualized  Spleen:  Mildly enlarged at 13 cm  Right Kidney:  Length: 13.1 cm. Echogenicity within normal limits. No mass or hydronephrosis visualized.  Left Kidney:  Length: 14.1 cm. Echogenicity within normal limits. No mass or hydronephrosis visualized.  Abdominal aorta:  Not visualized  Other findings:  There is a small volume of ascites right upper quadrant, as well as in the pelvis. There is also evidence to suggest the possibility of portal  vein thrombosis, with inability to demonstrate vascular flow within the main portal vein.  IMPRESSION: 1. Abnormal gallbladder with significant wall thickening and sludge within the gallbladder lumen but no sonographic Murphy sign 2. Small volume of ascites 3. Possible portal vein thrombosis. 4. Liver appearance consistent with cirrhosis   Electronically Signed   By: Esperanza Heiraymond  Rubner M.D.   On: 05/10/2013 19:01   Dg Chest Port 1 View  05/11/2013   CLINICAL DATA:  Cirrhosis.  Acute encephalopathy and hypotension.  EXAM: PORTABLE CHEST - 1 VIEW  COMPARISON:  Single view of the chest 05/10/2013.  FINDINGS: Left IJ catheter is unchanged. There is cardiomegaly with vascular congestion. Left basilar atelectasis is noted. No pneumothorax or pleural effusion. Calcified mediastinal lymph nodes are identified.  IMPRESSION: Cardiomegaly and vascular congestion.   Electronically Signed   By: Drusilla Kannerhomas  Dalessio M.D.   On: 05/11/2013 14:47   Dg Chest Portable 1 View  05/10/2013   CLINICAL DATA:  Sepsis and left-sided central venous catheter placement.  EXAM: PORTABLE CHEST - 1 VIEW  COMPARISON:  Film earlier today at 1253 hr  FINDINGS: New left jugular central venous catheter has been placed with the catheter tip at the cavoatrial junction. No pneumothorax. Lungs show low volumes with bibasilar atelectasis and no overt edema or focal airspace consolidation.  The heart size and mediastinal contours are stable. Calcified mediastinal and hilar lymph nodes again noted.  IMPRESSION: Left jugular central venous catheter tip at the cavoatrial junction. No pneumothorax is identified.   Electronically Signed   By: Irish LackGlenn  Yamagata M.D.   On: 05/10/2013 17:08   Dg Chest Portable 1 View  05/10/2013   CLINICAL DATA:  History of liver disease.  Alcohol abuse.  EXAM: PORTABLE CHEST - 1 VIEW  COMPARISON:  05/05/2013 and multiple previous  FINDINGS: Artifact overlies the chest. The patient has taken a poor inspiration. The heart shows mild  enlargement. I think there is mild pulmonary edema. No consolidation or collapse. Chronic calcified granulomas and calcified lymph nodes as seen previously.  IMPRESSION: Poor inspiration.  Cardiomegaly.  Mild pulmonary edema.   Electronically Signed   By: Paulina FusiMark  Shogry M.D.   On: 05/10/2013 13:09    ASSESSMENT / PLAN:  PULMONARY A:   Acute hypoxemic respiratory failure in setting of suspected pulmonary edema, resolved P:   Goal SpO2>92 Supplemental oxygen  CARDIOVASCULAR A:  Sepsis in setting of UTI and adrenal insufficiency, lactate reassuring Baseline hypotension Inaccurate non invasive BP (30 mmHg less than a-line) P:  Goal SBP ( non invasive) >80 D/c a-line Start Midodrine 10 q8h   RENAL A:   AKI, resolved Hypokalemia P:   Trend BMP  D/c IVF K 40 x 2 D/c Foley  GASTROINTESTINAL A:   Nutrition GI Px P:   Diet D/c Protonix Lactulose  HEMATOLOGIC A:   Coagulopathy / thrombocytopenia in setting of liver disease VTE Px P:  Trend CBC SCD  INFECTIOUS A:   UTI At risk for SBP Presumed HCAP on transfer but no significant airspace disease and leukocytosis P:   Cultures and antibiotics as above  ENDOCRINE  A:   Adrenal insufficiency ( cortisol 3.8 in setting of hypotension ) Hyperglycemia(post steroids) P:   Hydrocortisone stress dose SSI  NEUROLOGIC A:   No evidence of alcohol witdrawal P:   Thiamine / Folate Lactulose Activity as tolerated  Brett Canales Minor ACNP [facility] PCCM Pager [phone] till 3 pm If no answer page [phone] 05/12/2013, 8:41 AM  I have personally obtained history, examined patient, evaluated and interpreted laboratory and imaging results, reviewed medical records, formulated assessment / plan and placed orders.  Lonia Farber, MD Pulmonary and Critical Care Medicine [facility] Pager: [phone]  05/12/2013, 1:51 PM

## 2013-05-13 ENCOUNTER — Inpatient Hospital Stay (HOSPITAL_COMMUNITY): Payer: Medicaid Other

## 2013-05-13 LAB — CBC
HCT: 25.4 % — ABNORMAL LOW (ref 36.0–46.0)
Hemoglobin: 8.4 g/dL — ABNORMAL LOW (ref 12.0–15.0)
MCH: 31.7 pg (ref 26.0–34.0)
MCHC: 33.1 g/dL (ref 30.0–36.0)
MCV: 95.8 fL (ref 78.0–100.0)
Platelets: 79 10*3/uL — ABNORMAL LOW (ref 150–400)
RBC: 2.65 MIL/uL — ABNORMAL LOW (ref 3.87–5.11)
RDW: 21.5 % — ABNORMAL HIGH (ref 11.5–15.5)
WBC: 9.3 10*3/uL (ref 4.0–10.5)

## 2013-05-13 LAB — BASIC METABOLIC PANEL
BUN: 16 mg/dL (ref 6–23)
CHLORIDE: 112 meq/L (ref 96–112)
CO2: 18 meq/L — AB (ref 19–32)
Calcium: 8.2 mg/dL — ABNORMAL LOW (ref 8.4–10.5)
Creatinine, Ser: 0.51 mg/dL (ref 0.50–1.10)
GFR calc Af Amer: >90 mL/min (ref >90)
GFR calc non Af Amer: >90 mL/min (ref >90)
Glucose, Bld: 240 mg/dL — ABNORMAL HIGH (ref 70–99)
Potassium: 3.9 mEq/L (ref 3.7–5.3)
SODIUM: 141 meq/L (ref 137–147)

## 2013-05-13 LAB — GLUCOSE, CAPILLARY
GLUCOSE-CAPILLARY: 203 mg/dL — AB (ref 70–99)
Glucose-Capillary: 175 mg/dL — ABNORMAL HIGH (ref 70–99)
Glucose-Capillary: 217 mg/dL — ABNORMAL HIGH (ref 70–99)
Glucose-Capillary: 216 mg/dL — ABNORMAL HIGH (ref 70–99)

## 2013-05-13 MED ORDER — HYDROCORTISONE NA SUCCINATE PF 100 MG IJ SOLR
25.0000 mg | Freq: Two times a day (BID) | INTRAMUSCULAR | Status: DC
  Administered 2013-05-13 – 2013-05-14 (×2): 25 mg via INTRAVENOUS
  Filled 2013-05-13 (×6): qty 0.5

## 2013-05-13 MED ORDER — VITAMIN B-1 100 MG PO TABS: 100.0000 mg | ORAL_TABLET | Freq: Every day | ORAL | Status: DC

## 2013-05-13 MED ORDER — SPIRONOLACTONE 50 MG PO TABS
50.0000 mg | ORAL_TABLET | Freq: Every morning | ORAL | Status: DC
  Administered 2013-05-13 – 2013-05-14 (×2): 50 mg via ORAL
  Filled 2013-05-13 (×2): qty 1

## 2013-05-13 MED ORDER — POTASSIUM CHLORIDE CRYS ER 20 MEQ PO TBCR
20.0000 meq | EXTENDED_RELEASE_TABLET | Freq: Two times a day (BID) | ORAL | Status: DC
  Administered 2013-05-13 – 2013-05-14 (×4): 20 meq via ORAL
  Filled 2013-05-13 (×6): qty 1

## 2013-05-13 MED ORDER — INSULIN ASPART 100 UNIT/ML ~~LOC~~ SOLN
4.0000 [IU] | Freq: Three times a day (TID) | SUBCUTANEOUS | Status: DC
  Administered 2013-05-14 – 2013-05-17 (×10): 4 [IU] via SUBCUTANEOUS

## 2013-05-13 MED ORDER — LACTULOSE 10 GM/15ML PO SOLN: 20.0000 g | Freq: Two times a day (BID) | ORAL | Status: DC

## 2013-05-13 MED ORDER — FUROSEMIDE 20 MG PO TABS
20.0000 mg | ORAL_TABLET | Freq: Two times a day (BID) | ORAL | Status: DC
  Administered 2013-05-13 (×2): 20 mg via ORAL
  Filled 2013-05-13 (×4): qty 1

## 2013-05-13 MED ORDER — PANTOPRAZOLE SODIUM 40 MG PO TBEC
40.0000 mg | DELAYED_RELEASE_TABLET | Freq: Two times a day (BID) | ORAL | Status: DC
  Administered 2013-05-13 – 2013-05-17 (×9): 40 mg via ORAL
  Filled 2013-05-13 (×9): qty 1

## 2013-05-13 NOTE — Progress Notes (Signed)
Inpatient Diabetes Program Recommendations  AACE/ADA: New Consensus Statement on Inpatient Glycemic Control (2013)  Target Ranges:  Prepandial:   less than 140 mg/dL      Peak postprandial:   less than 180 mg/dL (1-2 hours)      Critically ill patients:  140 - 180 mg/dL   Reason for Visit: Hyperglycemia in ICU setting  Diabetes history: None seen.  A1C 6.2 Outpatient Diabetes medications: N/A Current orders for Inpatient glycemic control: Sensitive Novolog Correction Scale tid  Results for [name], [name] (MRN [id number]) as of 05/13/2013 10:45  Ref. Range 05/12/2013 11:18 05/12/2013 19:27 05/13/2013 07:40  Glucose-Capillary Latest Range: 70-99 mg/dL 657233 (H) 846216 (H) 962203 (H)   Note: Patient on IV steroids.  Please consider increasing correction scale to Moderate Novolog Correction.  Thank you.  Bacilio Abascal S. Elsie Lincolnouth, RN, CNS, CDE Inpatient Diabetes Program, team pager [phone]

## 2013-05-13 NOTE — Progress Notes (Addendum)
[place] TEAM 1 - Stepdown/ICU TEAM Progress Note  [name] ZOX:[id number] DOB: [birth year] DOA: 05/10/2013 PCP: Tanna Furry, PA-C  Admit HPI / Brief Narrative: 43 y/o F with hx of anxiety, depression, ongoing ETOH abuse, ETOH cirrhosis with esophageal varices (s/p multiple banding procedures - last one here 03/2013), and recent admit to [facility] for bleeding varices who presented to [facility] on 5/8 with AMS & hypotension in the setting of end stage liver disease.   ER work up demonstrated profound hypotension with BP in the 50-60's systolic. Na 134, K 3.5, Cr 2.58 (up from 0.64), lactic acid 1.47, BNP 328, WBC 5.2, Hgb 9.9, platelets of 93, INR 1.86 (not on anticoagulation), urinalysis with many yeast / nitrate positive, CXR with cardiomegaly and mild vascular congestion.   HPI/Subjective: Patient is awake and interactive though somewhat lethargic.  She complains of "being swollen all over."  She denies shortness of breath chest pain headache or abdominal pain.  Assessment/Plan:  Acute hypoxemic respiratory failure - suspected pulmonary edema Resolved - CXR improved this morning - albumin 1.7 suggesting low oncotic pressure as etiology of edema - diurese and follow  SIRS in setting of adrenal insufficiency Clinically stabilized with resolution of sepsis physiology  UTI? - No evidence to support Urine culture not revealing - monitoring without antibiotic therapy  Adrenal insufficiency cortisol 3.8 in setting of hypotension - slowly taper stress dose steroids with consideration to long-term maintenance dosing  Baseline hypotension  Inaccurate non invasive BP (30 mmHg less than a-line) - BP appears to be stable at this time - follow with attempts at diuresis  EtOH abuse  Counseling and social work referral  Cirrhosis of liver w/ esophageal varices   Coagulopathy / thrombocytopenia in setting of liver disease No evidence of active bleeding at present  Hepatic  encephalopathy Ammonia 141 5/10 - increase lactulose and recheck in a.m.  AKI resolved   Hyperglycemia post steroids - A1c 6.2  Hypokalemia Improving with replacement  Code Status: FULL Family Communication: no family present at time of exam Disposition Plan: Transfer to medical bed - attempt to diuresis  Consultants: PCCM >> TRH  Procedures: none  Antibiotics: Cefpime 5/8 >> 5/9  Vanco 5/8 >> 5/9  Ceftriaxone 5/9 >>  DVT prophylaxis: SCDs  Objective: Blood pressure 100/56, pulse 74, temperature 98 F (36.7 C), temperature source Oral, resp. rate 18, height 5\' 4"  (1.626 m), weight 88.043 kg (194 lb 1.6 oz), SpO2 100.00%.  Intake/Output Summary (Last 24 hours) at 05/13/13 1751 Last data filed at 05/13/13 0800  Gross per 24 hour  Intake    920 ml  Output      6 ml  Net    914 ml   Exam: General: No acute respiratory distress Lungs: Mild bibasilar crackles with good air movement throughout other fields with no wheeze Cardiovascular: Regular rate and rhythm without murmur gallop or rub normal S1 and S2 Abdomen: Nontender, nondistended, soft, bowel sounds positive, no rebound, no ascites, no appreciable mass Extremities: No significant cyanosis, clubbing;  2+ diffuse edema bilateral lower extremities and upper extremities  Data Reviewed: Basic Metabolic Panel:  Recent Labs Lab 05/10/13 1156 05/11/13 0400 05/11/13 1155 05/12/13 0600 05/13/13 0430  NA 134*  --  138 141 141  K 3.5*  --  3.4* 3.3* 3.9  CL 95*  --  107 110 112  CO2 27  --  18* 20 18*  GLUCOSE 97  --  102* 216* 240*  BUN 24*  --  22  20 16  CREATININE 2.58*  --  1.46* 0.80 0.51  CALCIUM 8.3*  --  7.7* 7.8* 8.2*  MG  --  1.6  --   --   --   PHOS  --  3.9  --   --   --    Liver Function Tests:  Recent Labs Lab 05/10/13 1156 05/11/13 0400 05/11/13 1155  AST 71* 62* 68*  ALT 23 19 20   ALKPHOS 277* 231* 235*  BILITOT 5.7* 4.5* 5.0*  PROT 5.7* 4.8* 4.9*  ALBUMIN 2.2* 1.8* 1.7*     Recent Labs Lab 05/10/13 1157 05/11/13 0400 05/12/13 1045  AMMONIA 56 83* 141*   CBC:  Recent Labs Lab 05/10/13 1158 05/11/13 0400 05/12/13 0600 05/13/13 0430  WBC 5.2 6.5 7.7 9.3  NEUTROABS 3.4  --   --   --   HGB 9.9* 9.3* 9.1* 8.4*  HCT 30.2* 28.7* 27.4* 25.4*  MCV 97.1 97.6 96.5 95.8  PLT 93* 86* 98* 79*   Cardiac Enzymes:  Recent Labs Lab 05/10/13 1627  TROPONINI <0.30   BNP (last 3 results)  Recent Labs  05/10/13 1317  PROBNP 328.6*   CBG:  Recent Labs Lab 05/12/13 1118 05/12/13 1927 05/13/13 0740 05/13/13 1142 05/13/13 1647  GLUCAP 233* 216* 203* 175* 217*    Recent Results (from the past 240 hour(s))  CULTURE, BLOOD (ROUTINE X 2)     Status: None   Collection Time    05/10/13 12:50 PM      Result Value Ref Range Status   Specimen Description BLOOD RIGHT ANTECUBITAL   Final   Special Requests BOTTLES DRAWN AEROBIC ONLY 5CC   Final   Culture  Setup Time     Final   Value: 05/10/2013 16:30     Performed at [facility]   Culture     Final   Value:        BLOOD CULTURE RECEIVED NO GROWTH TO DATE CULTURE WILL BE HELD FOR 5 DAYS BEFORE ISSUING A FINAL NEGATIVE REPORT     Performed at [facility]   Report Status PENDING   Incomplete  CULTURE, BLOOD (ROUTINE X 2)     Status: None   Collection Time    05/10/13 12:55 PM      Result Value Ref Range Status   Specimen Description BLOOD RIGHT HAND   Final   Special Requests BOTTLES DRAWN AEROBIC ONLY 5CC   Final   Culture  Setup Time     Final   Value: 05/10/2013 16:30     Performed at [facility]   Culture     Final   Value:        BLOOD CULTURE RECEIVED NO GROWTH TO DATE CULTURE WILL BE HELD FOR 5 DAYS BEFORE ISSUING A FINAL NEGATIVE REPORT     Performed at [facility]   Report Status PENDING   Incomplete  URINE CULTURE     Status: None   Collection Time    05/10/13  1:18 PM      Result Value Ref Range Status   Specimen Description URINE, CATHETERIZED    Final   Special Requests NONE   Final   Culture  Setup Time     Final   Value: 05/10/2013 14:33     Performed at [facility]   Colony Count     Final   Value: NO GROWTH     [facility]formed at [facility]   Culture  Final   Value: NO GROWTH     Performed at [facility]   Report Status 05/11/2013 FINAL   Final  MRSA PCR SCREENING     Status: None   Collection Time    05/10/13  9:32 PM      Result Value Ref Range Status   MRSA by PCR NEGATIVE  NEGATIVE Final   Comment:            The GeneXpert MRSA Assay (FDA     approved for NASAL specimens     only), is one component of a     comprehensive MRSA colonization     surveillance program. It is not     intended to diagnose MRSA     infection nor to guide or     monitor treatment for     MRSA infections.     Studies:  Recent x-ray studies have been reviewed in detail by the Attending Physician  Scheduled Meds:  Scheduled Meds: . folic acid  1 mg Oral Daily  . furosemide  20 mg Oral BID  . hydrocortisone sod succinate (SOLU-CORTEF) inj  25 mg Intravenous Q12H  . insulin aspart  0-9 Units Subcutaneous TID WC  . lactulose  30 g Oral TID  . midodrine  10 mg Oral TID WC  . pantoprazole  40 mg Oral BID WC  . potassium chloride SA  20 mEq Oral BID  . spironolactone  50 mg Oral q morning - 10a  . thiamine  100 mg Oral Daily    Time spent on care of this patient: 35 mins   Lonia BloodJeffrey T Ahamed Hofland , MD   [facility] Office  [phone] Pager - Text Page per Amion as per below:  On-Call/Text Page:      [email or web address]      password [email or web address]  If 7PM-7AM, please contact night-coverage [email or web address] Password [email or web address] 05/13/2013, 5:51 PM   LOS: 3 days

## 2013-05-13 NOTE — Progress Notes (Signed)
NURSING PROGRESS NOTE  [name] [id number] Transfer Data: 05/13/2013 2:15 PM Attending Provider: Lonia BloodJeffrey T McClung, MD WUJ:WJXBPCP:HOUT, Lowanda FosterBRITTANY, PA-C Code Status: FUll  [name] Shownnita Muska is a 43 y.o. female patient transferred from  -No acute distress noted.  -No complaints of shortness of breath.  -No complaints of chest pain.    Blood pressure 100/56, pulse 74, temperature 98 F (36.7 C), temperature source Oral, resp. rate 18, height 5\' 4"  (1.626 m), weight 88.043 kg (194 lb 1.6 oz), SpO2 100.00%.   IV Fluids: NSL.   Allergies:  Review of patient's allergies indicates no known allergies.  Past Medical History:   has a past medical history of Sclerosing cholangitis; Panic attacks; Anxiety; Alcohol abuse; Ascites; Cirrhosis of liver; EtOH dependence (08/31/2012); Family history of anesthesia complication; Depression; Anemia; and Hepatitis.  Past Surgical History:   has past surgical history that includes None; ERCP; Esophagogastroduodenoscopy (N/A, 09/10/2012); Esophagogastroduodenoscopy (N/A, 11/04/2012); and Esophagogastroduodenoscopy (N/A, 03/06/2013).  Social History:   reports that she has been smoking Cigarettes.  She has a 10 pack-year smoking history. She has never used smokeless tobacco. She reports that she uses illicit drugs (Marijuana). She reports that she does not drink alcohol.  Skin: Bruise noted on Right arm and flank noted, also abrasion to Left shin noted. Patient has had a fall PTA.   Patient/Family orientated to room. Information packet given to patient/family. Admission inpatient armband information verified with patient/family to include name and date of birth and placed on patient arm. Side rails up x 2, fall assessment and education completed with patient/family. Patient/family able to verbalize understanding of risk associated with falls and verbalized understanding to call for assistance before getting out of bed. Call light within reach. Patient/family able to voice and  demonstrate understanding of unit orientation instructions.    Will continue to evaluate and treat per MD orders.

## 2013-05-14 DIAGNOSIS — K7290 Hepatic failure, unspecified without coma: Secondary | ICD-10-CM | POA: Diagnosis present

## 2013-05-14 DIAGNOSIS — K7682 Hepatic encephalopathy: Secondary | ICD-10-CM | POA: Diagnosis present

## 2013-05-14 DIAGNOSIS — D689 Coagulation defect, unspecified: Secondary | ICD-10-CM | POA: Diagnosis present

## 2013-05-14 LAB — COMPREHENSIVE METABOLIC PANEL
ALT: 25 U/L (ref 0–35)
AST: 58 U/L — ABNORMAL HIGH (ref 0–37)
Albumin: 1.7 g/dL — ABNORMAL LOW (ref 3.5–5.2)
Alkaline Phosphatase: 249 U/L — ABNORMAL HIGH (ref 39–117)
BUN: 11 mg/dL (ref 6–23)
CO2: 18 meq/L — AB (ref 19–32)
CREATININE: 0.53 mg/dL (ref 0.50–1.10)
Calcium: 8.3 mg/dL — ABNORMAL LOW (ref 8.4–10.5)
Chloride: 115 mEq/L — ABNORMAL HIGH (ref 96–112)
GFR calc Af Amer: >90 mL/min (ref >90)
GLUCOSE: 161 mg/dL — AB (ref 70–99)
Potassium: 3.8 mEq/L (ref 3.7–5.3)
Sodium: 145 mEq/L (ref 137–147)
TOTAL PROTEIN: 5 g/dL — AB (ref 6.0–8.3)
Total Bilirubin: 3.1 mg/dL — ABNORMAL HIGH (ref 0.3–1.2)

## 2013-05-14 LAB — AMMONIA: Ammonia: 110 umol/L — ABNORMAL HIGH (ref 11–60)

## 2013-05-14 LAB — CBC
HEMATOCRIT: 26.5 % — AB (ref 36.0–46.0)
HEMOGLOBIN: 8.6 g/dL — AB (ref 12.0–15.0)
MCH: 31 pg (ref 26.0–34.0)
MCHC: 32.5 g/dL (ref 30.0–36.0)
MCV: 95.7 fL (ref 78.0–100.0)
Platelets: 55 10*3/uL — ABNORMAL LOW (ref 150–400)
RBC: 2.77 MIL/uL — ABNORMAL LOW (ref 3.87–5.11)
RDW: 22.5 % — ABNORMAL HIGH (ref 11.5–15.5)
WBC: 11.4 10*3/uL — AB (ref 4.0–10.5)

## 2013-05-14 LAB — GLUCOSE, CAPILLARY
GLUCOSE-CAPILLARY: 144 mg/dL — AB (ref 70–99)
GLUCOSE-CAPILLARY: 210 mg/dL — AB (ref 70–99)
GLUCOSE-CAPILLARY: 152 mg/dL — AB (ref 70–99)
Glucose-Capillary: 142 mg/dL — ABNORMAL HIGH (ref 70–99)

## 2013-05-14 MED ORDER — SPIRONOLACTONE 100 MG PO TABS
100.0000 mg | ORAL_TABLET | Freq: Every morning | ORAL | Status: DC
  Administered 2013-05-15: 100 mg via ORAL
  Filled 2013-05-14: qty 1

## 2013-05-14 MED ORDER — RIFAXIMIN 550 MG PO TABS
550.0000 mg | ORAL_TABLET | Freq: Two times a day (BID) | ORAL | Status: DC
  Administered 2013-05-14 – 2013-05-17 (×7): 550 mg via ORAL
  Filled 2013-05-14 (×9): qty 1

## 2013-05-14 MED ORDER — FUROSEMIDE 40 MG PO TABS
40.0000 mg | ORAL_TABLET | Freq: Every day | ORAL | Status: DC
  Administered 2013-05-14 – 2013-05-17 (×4): 40 mg via ORAL
  Filled 2013-05-14 (×4): qty 1

## 2013-05-14 MED ORDER — RIFAXIMIN 200 MG PO TABS: 200.0000 mg | ORAL_TABLET | Freq: Three times a day (TID) | ORAL | Status: DC

## 2013-05-14 MED ORDER — FUROSEMIDE 40 MG PO TABS
40.0000 mg | ORAL_TABLET | Freq: Every day | ORAL | Status: DC
  Filled 2013-05-14: qty 1

## 2013-05-14 MED ORDER — RIFAXIMIN 550 MG PO TABS: 550.0000 mg | ORAL_TABLET | Freq: Three times a day (TID) | ORAL | Status: DC

## 2013-05-14 MED ORDER — SPIRONOLACTONE 100 MG PO TABS: 100.0000 mg | ORAL_TABLET | Freq: Every morning | ORAL | Status: DC

## 2013-05-14 NOTE — Evaluation (Signed)
Physical Therapy Evaluation Patient Details Name: [name] MRN: [id number] DOB: [birth year] Today's Date: 05/14/2013   History of Present Illness  43 yo with history of alcohol abuse and alcoholic cirrhosis admitted 5/8 with acute encephalopathy and hypotension.  Recent hospitalization due to bleeding esophageal varices.  Clinical Impression  Patient presents with decreased independence with mobility due to deficits listed in PT problem list.  She will benefit from skilled PT in the acute setting to allow [facility] (no PT benefit so may need HHRN/aide).      Follow Up Recommendations Home health PT;Supervision - Intermittent (see note above)    Equipment Recommendations  Rolling walker with 5" wheels    Recommendations for Other Services       Precautions / Restrictions Precautions Precautions: Fall Precaution Comments: most recent fall in January with injury to left hip.  Reports has herniated disc as well and intermittent pain in which difficulty walking, lifting leg into car and step up to get into home. Restrictions Weight Bearing Restrictions: No      Mobility  Bed Mobility Overal bed mobility: Modified Independent                Transfers Overall transfer level: Modified independent                  Ambulation/Gait Ambulation/Gait assistance: Supervision Ambulation Distance (Feet): 200 Feet Assistive device: None Gait Pattern/deviations: Step-through pattern;Decreased stride length;Shuffle;Wide base of support     General Gait Details: increased lateral sway with ambulation due to leg weakness/edema  Stairs            Wheelchair Mobility    Modified Rankin (Stroke Patients Only)       Balance Overall balance assessment: History of Falls;Needs assistance   Sitting balance-Leahy Scale: Good       Standing balance-Leahy Scale: Good                               Pertinent Vitals/Pain No pain currently     Home Living Family/patient expects to be discharged to:: Private residence Living Arrangements: Children (7 y/o son) Available Help at Discharge: Family Type of Home: House Home Access: Level entry     Home Layout: One level Home Equipment: Gilmer MorCane - single point Additional Comments: Lives in government housing and significant other to call about getting grabbar in shower    Prior Function Level of Independence: Needs assistance   Gait / Transfers Assistance Needed: has to have help with car transfers  ADL's / Homemaking Assistance Needed: occasional difficulty with IADL's due tp hip/leg pain        Hand Dominance   Dominant Hand: Right    Extremity/Trunk Assessment   Upper Extremity Assessment: Generalized weakness (due to significant edema)           Lower Extremity Assessment: Generalized weakness (due to significant edema)         Communication   Communication: No difficulties  Cognition Arousal/Alertness: Awake/alert Behavior During Therapy: WFL for tasks assessed/performed Overall Cognitive Status: Within Functional Limits for tasks assessed                      General Comments      Exercises        Assessment/Plan    PT Assessment Patient needs continued PT services  PT Diagnosis Abnormality of gait;Generalized weakness   PT Problem List Decreased  strength;Decreased knowledge of use of DME;Decreased balance;Decreased mobility;Decreased activity tolerance  PT Treatment Interventions DME instruction;Gait training;Balance training;Functional mobility training;Patient/family education;Therapeutic activities;Therapeutic exercise   PT Goals (Current goals can be found in the Care Plan section) Acute Rehab PT Goals Patient Stated Goal: To go home PT Goal Formulation: With patient/family Time For Goal Achievement: 05/21/13 Potential to Achieve Goals: Good    Frequency Min 3X/week   Barriers to discharge Decreased caregiver support       Co-evaluation               End of Session Equipment Utilized During Treatment: Gait belt Activity Tolerance: Patient tolerated treatment well Patient left: in chair;with call bell/phone within reach;with family/visitor present           Time: 0939-1003 PT Time Calculation (min): 24 min   Charges:   PT Evaluation $Initial PT Evaluation Tier I: 1 Procedure PT Treatments $Gait Training: 8-22 mins   PT G CodesAne Payment:          Roslyn Else R Bruno Leach 05/14/2013, 10:27 AM  Sheran Lawlessyndi Zaylan Kissoon, PT [phone] 05/14/2013

## 2013-05-14 NOTE — Progress Notes (Signed)
Patient complaining of SOB when ambulating to bathroom. Resting 02 sats 100% on RA, when pt ambulating 02 sats drop to 86% on RA.

## 2013-05-14 NOTE — Progress Notes (Signed)
[facility] PROGRESS NOTE  [name] WSF:[id number] DOB: December 04, 1970 DOA: 05/10/2013 PCP: [name], PA-C  Assessment/Plan:  Hepatic encephalopathy Patient admitted initially by the Critical Care Service for altered mental status with hypotension. Ammonia levels elevated. Lactulose dosed at 30 mg TID,  rifaximin resumed. Patient's has returned to baseline, A&OX3   Adrenal insufficiency with hypotension cortisol 3.8 in setting of hypotension -  Requiring solu-cortef and midodrine.  Still on stress dose steroids Solucortef 25 mg q 12 hours On admission required a central line and pressors. Now resolved.  Gradually resuming diuretics as BP permits.  Acute hypoxemic respiratory failure  Secondary to pulmonary edema Resolved as noted on  CXR on 05/13/2013 Resume home doses of Lasix and Spironolactone.   Recheck creatine in am    Acute kidney failure- Prerenal Dehydration? Due to encephalopathy Presented with liver failure and kidney failure creatine 2.58 5/11 creatine @ 0.51 - resolved Lasix and Spironolactone restarted 5/12  EtOH abuse  Counseling and social work referral   Elevated LFTs. Particularly Alk phos and Tbili -Obstructive? No baseline labs in EPIC. Ultrasound shows thickened GB wall with sludge and normal CBD.   ? Portal Vein Thrombosis on Ultrasound. Will curbside GI for their opinion.  Contrast CT vs MRI for further evaluation?  Cirrhosis of liver w/ esophageal varices  Recently discharged from [facility] for esophageal bleed Denies vomiting, blood in the stool  Ascites noted in abdomen, Edema in legs,  Non-tense, non tender,  Continue diuresis as much fluid off as possible w/o effect  kidney function   Coagulopathy / thrombocytopenia in setting of liver disease  Multiple bruises on her body, no active bleed Platelets continuing to drop.  55 on 5/12. Will monitor.   Code Status: full Family Communication: Boyfriend at bedside Disposition Plan:  to home when appropriate.   Consultants:  none  Procedures:  none  Antibiotics:  rifaximin  HPI/Subjective: 43 y/o F with hx of anxiety, depression, ongoing ETOH abuse, ETOH cirrhosis with esophageal varices (s/p multiple banding procedures - last one here 03/2013), and recent admit to [name] for bleeding varices who presented to [facility] on 5/8 with AMS & hypotension in the setting of end stage liver disease.  ER work up demonstrated profound hypotension with BP in the 01-74'B systolic. Na 134, K 3.5, Cr 2.58 (up from 0.64), lactic acid 1.47, BNP 328, WBC 5.2, Hgb 9.9, platelets of 93, INR 1.86 (not on anticoagulation), urinalysis with many yeast / nitrate positive, CXR with cardiomegaly and mild vascular congestion.    Objective: Filed Vitals:   05/14/13 1314  BP: 99/62  Pulse: 75  Temp: 98.5 F (36.9 C)  Resp: 16    Intake/Output Summary (Last 24 hours) at 05/14/13 1505 Last data filed at 05/14/13 0553  Gross per 24 hour  Intake    600 ml  Output      4 ml  Net    596 ml   Filed Weights   05/10/13 1643 05/13/13 0400 05/13/13 1316  Weight: 63.504 kg (140 lb) 88.1 kg (194 lb 3.6 oz) 88.043 kg (194 lb 1.6 oz)    Exam:  Physical Exam  Constitutional: oriented to person, place, and time. appears well-developed and well-nourished. No distress.  HENT:   Head: Normocephalic and atraumatic.   Eyes: icteric 1+, Pupils are equal, round, and reactive to light.  Neck: No JVD present.  Cardiovascular: Normal rate, regular rhythm and normal heart sounds.   Respiratory: Effort normal and breath sounds normal. No stridor. No  respiratory distress. no wheezes. no tenderness.  GI: Soft. Bowel sounds are loud. Mild distension. +Shifting dullness noted .There is no tenderness. There is no rebound. Enlarged liver. Musculoskeletal: Normal range of motion. no tenderness.  Neurological: alert and oriented to person, place, and time.  Skin: Skin is warm and dry. multiple bruising  sites noted on he right arm,  Pannus area.  Edema in LE Psychiatric: normal mood and affect. speech is normal and behavior is normal. Judgment and thought content normal. Cognition and memory are normal.    Data Reviewed: Basic Metabolic Panel:  Recent Labs Lab 05/10/13 1156 05/11/13 0400 05/11/13 1155 05/12/13 0600 05/13/13 0430 05/14/13 0656  NA 134*  --  138 141 141 145  K 3.5*  --  3.4* 3.3* 3.9 3.8  CL 95*  --  107 110 112 115*  CO2 27  --  18* 20 18* 18*  GLUCOSE 97  --  102* 216* 240* 161*  BUN 24*  --  _0 CREATININE 2.58*  --  1.46* 0.80 0.51 0.53  CALCIUM 8.3*  --  7.7* 7.8* 8.2* 8.3*  MG  --  1.6  --   --   --   --   PHOS  --  3.9  --   --   --   --    Liver Function Tests:  Recent Labs Lab 05/10/13 1156 05/11/13 0400 05/11/13 1155 05/14/13 0656  AST 71* 62* 68* 58*  ALT _1 ALKPHOS 277* 231* 235* 249*  BILITOT 5.7* 4.5* 5.0* 3.1*  PROT 5.7* 4.8* 4.9* 5.0*  ALBUMIN 2.2* 1.8* 1.7* 1.7*    Recent Labs Lab 05/10/13 1157 05/11/13 0400 05/12/13 1045 05/14/13 0500  AMMONIA 56 83* 141* 110*   CBC:  Recent Labs Lab 05/10/13 1158 05/11/13 0400 05/12/13 0600 05/13/13 0430 05/14/13 0656  WBC 5.2 6.5 7.7 9.3 11.4*  NEUTROABS 3.4  --   --   --   --   HGB 9.9* 9.3* 9.1* 8.4* 8.6*  HCT 30.2* 28.7* 27.4* 25.4* 26.5*  MCV 97.1 97.6 96.5 95.8 95.7  PLT 93* 86* 98* 79* 55*   Cardiac Enzymes:  Recent Labs Lab 05/10/13 1627  TROPONINI <0.30   BNP (last 3 results)  Recent Labs  05/10/13 1317  PROBNP 328.6*   CBG:  Recent Labs Lab 05/13/13 1142 05/13/13 1647 05/13/13 2212 05/14/13 0756 05/14/13 1159  GLUCAP 175* 217* 216* 144* 210*    Recent Results (from the past 240 hour(s))  CULTURE, BLOOD (ROUTINE X 2)     Status: None   Collection Time    05/10/13 12:50 PM      Result Value Ref Range Status   Specimen Description BLOOD RIGHT ANTECUBITAL   Final   Special Requests BOTTLES DRAWN AEROBIC ONLY 5CC   Final    Culture  Setup Time     Final   Value: 05/10/2013 16:30     Performed at [facility]   Culture     Final   Value:        BLOOD CULTURE RECEIVED NO GROWTH TO DATE CULTURE WILL BE HELD FOR 5 DAYS BEFORE ISSUING A FINAL NEGATIVE REPORT     Performed at [facility]   Report Status PENDING   Incomplete  CULTURE, BLOOD (ROUTINE X 2)     Status: None   Collection Time    05/10/13 12:55 PM      Result Value Ref Range Status  Specimen Description BLOOD RIGHT HAND   Final   Special Requests BOTTLES DRAWN AEROBIC ONLY 5CC   Final   Culture  Setup Time     Final   Value: 05/10/2013 16:30     Performed at [facility]   Culture     Final   Value:        BLOOD CULTURE RECEIVED NO GROWTH TO DATE CULTURE WILL BE HELD FOR 5 DAYS BEFORE ISSUING A FINAL NEGATIVE REPORT     Performed at [facility]   Report Status PENDING   Incomplete  URINE CULTURE     Status: None   Collection Time    05/10/13  1:18 PM      Result Value Ref Range Status   Specimen Description URINE, CATHETERIZED   Final   Special Requests NONE   Final   Culture  Setup Time     Final   Value: 05/10/2013 14:33     Performed at [place]     Final   Value: NO GROWTH     Performed at [facility]   Culture     Final   Value: NO GROWTH     Performed at [facility]   Report Status 05/11/2013 FINAL   Final  MRSA PCR SCREENING     Status: None   Collection Time    05/10/13  9:32 PM      Result Value Ref Range Status   MRSA by PCR NEGATIVE  NEGATIVE Final   Comment:            The GeneXpert MRSA Assay (FDA     approved for NASAL specimens     only), is one component of a     comprehensive MRSA colonization     surveillance program. It is not     intended to diagnose MRSA     infection nor to guide or     monitor treatment for     MRSA infections.     Studies: Dg Chest Port 1 View  05/13/2013   CLINICAL DATA:  Congestive heart failure  EXAM:  PORTABLE CHEST - 1 VIEW  COMPARISON:  May 11, 2013  FINDINGS: There is slightly less interstitial edema compared to recent prior study. There is no consolidation. Heart is mildly enlarged. There is mild pulmonary venous hypertension. There are multiple calcified hilar an azygos region lymph nodes. No adenopathy. No pneumothorax. Central catheter tip is at the cavoatrial junction. There is evidence of old fracture of the lateral left clavicle with displaced fragments, stable.  IMPRESSION: Slightly less interstitial edema. No new opacity. No pneumothorax apparent.   Electronically Signed   By: Lowella Grip M.D.   On: 05/13/2013 07:25    Scheduled Meds: . folic acid  1 mg Oral Daily  . furosemide  40 mg Oral Daily  . hydrocortisone sod succinate (SOLU-CORTEF) inj  25 mg Intravenous Q12H  . insulin aspart  0-9 Units Subcutaneous TID WC  . insulin aspart  4 Units Subcutaneous TID WC  . lactulose  30 g Oral TID  . midodrine  10 mg Oral TID WC  . pantoprazole  40 mg Oral BID WC  . potassium chloride SA  20 mEq Oral BID  . rifaximin  550 mg Oral TID  . [START ON 05/15/2013] spironolactone  100 mg Oral q morning - 10a  . thiamine  100 mg Oral Daily   Continuous Infusions:  Active Problems:   Acute respiratory failure   Septic shock   Sepsis   Encephalopathy, hepatic   Coagulopathy     Time spent: [place], [place]. Imogene Burn, [place] [phone] [facility]  If 7PM-7AM, please contact night-coverage at [email or web address], password [facility] 05/14/2013, 3:05 PM  LOS: 4 days   Attending Patient seen and examined, agree with the assessment and plan as outlined above. Agree with attempted diuresis, Ms York spoke with Dr Michail Sermon with recommended no anticoagulation for Portal vein thromboses given recent hx of variceal bleeding. Rest as above.  Nena Alexander MD

## 2013-05-14 NOTE — Progress Notes (Deleted)
Clinical Social Worker received referral for possible ST-SNF placement.  Chart reviewed.  PT/OT recommending home with home health.     CSW signing off - please re consult if social work needs arise.  Stanislaw Acton, MSW, LCSWA [phone]    

## 2013-05-15 ENCOUNTER — Inpatient Hospital Stay (HOSPITAL_COMMUNITY): Payer: Medicaid Other

## 2013-05-15 DIAGNOSIS — F411 Generalized anxiety disorder: Secondary | ICD-10-CM

## 2013-05-15 DIAGNOSIS — K7030 Alcoholic cirrhosis of liver without ascites: Secondary | ICD-10-CM

## 2013-05-15 DIAGNOSIS — I8511 Secondary esophageal varices with bleeding: Secondary | ICD-10-CM

## 2013-05-15 DIAGNOSIS — F329 Major depressive disorder, single episode, unspecified: Secondary | ICD-10-CM

## 2013-05-15 DIAGNOSIS — F3289 Other specified depressive episodes: Secondary | ICD-10-CM

## 2013-05-15 LAB — COMPREHENSIVE METABOLIC PANEL
ALT: 28 U/L (ref 0–35)
AST: 63 U/L — AB (ref 0–37)
Albumin: 1.8 g/dL — ABNORMAL LOW (ref 3.5–5.2)
Alkaline Phosphatase: 239 U/L — ABNORMAL HIGH (ref 39–117)
BUN: 9 mg/dL (ref 6–23)
CALCIUM: 8.4 mg/dL (ref 8.4–10.5)
CO2: 21 mEq/L (ref 19–32)
Chloride: 110 mEq/L (ref 96–112)
Creatinine, Ser: 0.49 mg/dL — ABNORMAL LOW (ref 0.50–1.10)
GFR calc Af Amer: >90 mL/min (ref >90)
GFR calc non Af Amer: >90 mL/min (ref >90)
Glucose, Bld: 144 mg/dL — ABNORMAL HIGH (ref 70–99)
Potassium: 3.3 mEq/L — ABNORMAL LOW (ref 3.7–5.3)
SODIUM: 142 meq/L (ref 137–147)
Total Bilirubin: 3.7 mg/dL — ABNORMAL HIGH (ref 0.3–1.2)
Total Protein: 5 g/dL — ABNORMAL LOW (ref 6.0–8.3)

## 2013-05-15 LAB — BODY FLUID CELL COUNT WITH DIFFERENTIAL
EOS FL: NONE SEEN %
Lymphs, Fluid: 9 %
MONOCYTE-MACROPHAGE-SEROUS FLUID: 68 % (ref 50–90)
Neutrophil Count, Fluid: 23 % (ref 0–25)
Total Nucleated Cell Count, Fluid: 82 cu mm (ref 0–1000)

## 2013-05-15 LAB — CBC
HEMATOCRIT: 27.6 % — AB (ref 36.0–46.0)
HEMOGLOBIN: 9.2 g/dL — AB (ref 12.0–15.0)
MCH: 31.6 pg (ref 26.0–34.0)
MCHC: 33.3 g/dL (ref 30.0–36.0)
MCV: 94.8 fL (ref 78.0–100.0)
Platelets: 97 10*3/uL — ABNORMAL LOW (ref 150–400)
RBC: 2.91 MIL/uL — ABNORMAL LOW (ref 3.87–5.11)
RDW: 22.6 % — ABNORMAL HIGH (ref 11.5–15.5)
WBC: 9.8 10*3/uL (ref 4.0–10.5)

## 2013-05-15 LAB — GLUCOSE, CAPILLARY
GLUCOSE-CAPILLARY: 231 mg/dL — AB (ref 70–99)
Glucose-Capillary: 113 mg/dL — ABNORMAL HIGH (ref 70–99)
Glucose-Capillary: 113 mg/dL — ABNORMAL HIGH (ref 70–99)
Glucose-Capillary: 211 mg/dL — ABNORMAL HIGH (ref 70–99)

## 2013-05-15 MED ORDER — HYDROCORTISONE NA SUCCINATE PF 100 MG IJ SOLR
25.0000 mg | Freq: Once | INTRAMUSCULAR | Status: AC
  Administered 2013-05-15: 25 mg via INTRAVENOUS
  Filled 2013-05-15: qty 0.5

## 2013-05-15 MED ORDER — LACTULOSE 10 GM/15ML PO SOLN
30.0000 g | Freq: Two times a day (BID) | ORAL | Status: DC
  Administered 2013-05-15 – 2013-05-17 (×5): 30 g via ORAL
  Filled 2013-05-15 (×6): qty 45

## 2013-05-15 MED ORDER — FUROSEMIDE 20 MG PO TABS
20.0000 mg | ORAL_TABLET | Freq: Every day | ORAL | Status: DC
  Administered 2013-05-16: 20 mg via ORAL
  Filled 2013-05-15 (×2): qty 1

## 2013-05-15 MED ORDER — POTASSIUM CHLORIDE CRYS ER 20 MEQ PO TBCR
40.0000 meq | EXTENDED_RELEASE_TABLET | Freq: Two times a day (BID) | ORAL | Status: DC
  Administered 2013-05-15 – 2013-05-17 (×4): 40 meq via ORAL
  Filled 2013-05-15 (×7): qty 2

## 2013-05-15 MED ORDER — SPIRONOLACTONE 50 MG PO TABS
150.0000 mg | ORAL_TABLET | Freq: Every morning | ORAL | Status: DC
  Administered 2013-05-16 – 2013-05-17 (×2): 150 mg via ORAL
  Filled 2013-05-15 (×2): qty 1

## 2013-05-15 MED ORDER — FUROSEMIDE 20 MG PO TABS: 20.0000 mg | ORAL_TABLET | Freq: Every day | ORAL | Status: DC

## 2013-05-15 MED ORDER — HYDROCORTISONE SOD SUCCINATE 100 MG PF FOR IT USE: 25.0000 mg | Freq: Once | INTRAMUSCULAR | Status: DC

## 2013-05-15 NOTE — Progress Notes (Signed)
[facility] PROGRESS NOTE  Ahnya Akre UTM:[id number] DOB: 07/27/1970 DOA: 05/10/2013 PCP: [name], PA-C  Assessment/Plan: Hepatic encephalopathy in the setting of cirrhosis Patient admitted initially by the Critical Care Service for altered mental status with hypotension.  Ammonia levels elevated. 141 on 5/10. Lactulose dose decreased to 30 mg BID (titrate to 3-5 loose stools daily), rifaximin resumed.  Patient's has returned to baseline, A&OX3   Worsening Ascites Patient's diuretics were held on admission due to hypotension Have been gradually adding back diuretics since 5/11.  (while simultaneously continuing midodrine and tapering solucortef) 5/13 LEE and abdominal ascites were significantly worse.  (patient SOB) Paracentesis performed 5/13.  3 liters removed. Requested GI consult and follow as Dr. Michail Sermon knows her well.  Adrenal insufficiency with hypotension  cortisol 3.8 in setting of hypotension  On admission required a central line and pressors.  Requiring solu-cortef and midodrine. Reduced Solucortef to 25 mg q 24 hours on 5/13.  Hopefully d/c 5/14 Gradually resumed diuretics as BP permits.  Coagulopathy / thrombocytopenia in setting of liver disease  Multiple bruises on her body, no active bleed  Platelets 55 on 5/12.  Increased 5/13 to 97. Will monitor.  Elevated LFTs/Obstructive Jaundice? Particularly Alk phos and Tbili - No baseline labs in EPIC.  Ultrasound shows thickened GB wall with sludge and normal CBD.  Patient icteric/Jaundiced on exam.  ? Portal Vein Thrombosis on Ultrasound.  Per GI no further work up.  Unable to anticoagulate due to recent variceal bleed.  Cirrhosis of liver w/ esophageal varices  Recently discharged from [facility] for esophageal bleed  Denies vomiting, blood in the stool   Acute hypoxemic respiratory failure  Secondary to pulmonary edema  Resolved as noted on CXR on 05/13/2013  Resumed home doses of Lasix and  Spironolactone on 5/12  Acute kidney failure- Prerenal  Dehydration? Due to encephalopathy  Presented with liver failure and kidney failure creatine 2.58  5/11 creatine @ 0.51 - resolved   Continued EtOH abuse  Counseling and social work referral    Code Status: Full Family Communication: none Disposition Plan: Inpatient.  Discharge when appropriate.  Consultants:  [facility]  Procedures:  Paracentesis  Antibiotics:  None  HPI/Subjective: 43 y/o F with hx of anxiety, depression, ongoing ETOH abuse, ETOH cirrhosis with esophageal varices (s/p multiple banding procedures - last one here 03/2013), and recent admit to [facility] for bleeding varices who presented to [facility] on 5/8 with AMS & hypotension in the setting of end stage liver disease. ER work up demonstrated profound hypotension with BP in the 56-81'E systolic. Na 134, K 3.5, Cr 2.58 (up from 0.64), lactic acid 1.47, BNP 328, WBC 5.2, Hgb 9.9, platelets of 93, INR 1.86 (not on anticoagulation), urinalysis with many yeast / nitrate positive, CXR with cardiomegaly and mild vascular congestion.    Objective: Filed Vitals:   05/15/13 1020  BP: 113/57  Pulse:   Temp:   Resp:     Intake/Output Summary (Last 24 hours) at 05/15/13 1229 Last data filed at 05/15/13 0500  Gross per 24 hour  Intake    600 ml  Output      5 ml  Net    595 ml   Filed Weights   05/10/13 1643 05/13/13 0400 05/13/13 1316  Weight: 63.504 kg (140 lb) 88.1 kg (194 lb 3.6 oz) 88.043 kg (194 lb 1.6 oz)    Exam:  Physical Exam  Constitutional: oriented to person, place, and time. appears well-developed and well-nourished. No distress.  HENT:   Head: Normocephalic and atraumatic.   Eyes: icterus +2 . Pupils are equal, round, and reactive to light.  Neck: No JVD present.  Cardiovascular: Normal rate, regular rhythm and normal heart sounds.   Respiratory: Effort normal and breath sounds decreased. No stridor. No respiratory distress. no  wheezes. no tenderness.  GI: Soft. Bowel sounds are normal. She distension, tightness,  Shifting fluid wave. There is no rebound.  Musculoskeletal: Normal range of motion. no tenderness.  Neurological: alert and oriented to person, place, and time.  Skin: Skin is warm and dry. not diaphoretic. No erythema. Jaundiced 2+ Psychiatric: normal mood and affect. speech is normal and behavior is normal.   Data Reviewed: Basic Metabolic Panel:  Recent Labs Lab 05/11/13 0400 05/11/13 1155 05/12/13 0600 05/13/13 0430 05/14/13 0656 05/15/13 0435  NA  --  138 141 141 145 142  K  --  3.4* 3.3* 3.9 3.8 3.3*  CL  --  107 110 112 115* 110  CO2  --  18* 20 18* 18* 21  GLUCOSE  --  102* 216* 240* 161* 144*  BUN  --  22 20 16 11 9   CREATININE  --  1.46* 0.80 0.51 0.53 0.49*  CALCIUM  --  7.7* 7.8* 8.2* 8.3* 8.4  MG 1.6  --   --   --   --   --   PHOS 3.9  --   --   --   --   --    Liver Function Tests:  Recent Labs Lab 05/10/13 1156 05/11/13 0400 05/11/13 1155 05/14/13 0656 05/15/13 0435  AST 71* 62* 68* 58* 63*  ALT 23 19 20 25 28   ALKPHOS 277* 231* 235* 249* 239*  BILITOT 5.7* 4.5* 5.0* 3.1* 3.7*  PROT 5.7* 4.8* 4.9* 5.0* 5.0*  ALBUMIN 2.2* 1.8* 1.7* 1.7* 1.8*     Recent Labs Lab 05/10/13 1157 05/11/13 0400 05/12/13 1045 05/14/13 0500  AMMONIA 56 83* 141* 110*   CBC:  Recent Labs Lab 05/10/13 1158 05/11/13 0400 05/12/13 0600 05/13/13 0430 05/14/13 0656 05/15/13 0435  WBC 5.2 6.5 7.7 9.3 11.4* 9.8  NEUTROABS 3.4  --   --   --   --   --   HGB 9.9* 9.3* 9.1* 8.4* 8.6* 9.2*  HCT 30.2* 28.7* 27.4* 25.4* 26.5* 27.6*  MCV 97.1 97.6 96.5 95.8 95.7 94.8  PLT 93* 86* 98* 79* 55* 97*   Cardiac Enzymes:  Recent Labs Lab 05/10/13 1627  TROPONINI <0.30   BNP (last 3 results)  Recent Labs  05/10/13 1317  PROBNP 328.6*   CBG:  Recent Labs Lab 05/14/13 1159 05/14/13 1652 05/14/13 2226 05/15/13 0754 05/15/13 1208  GLUCAP 210* 152* 142* 113* 113*    Recent  Results (from the past 240 hour(s))  CULTURE, BLOOD (ROUTINE X 2)     Status: None   Collection Time    05/10/13 12:50 PM      Result Value Ref Range Status   Specimen Description BLOOD RIGHT ANTECUBITAL   Final   Special Requests BOTTLES DRAWN AEROBIC ONLY 5CC   Final   Culture  Setup Time     Final   Value: 05/10/2013 16:30     Performed at [facility]   Culture     Final   Value:        BLOOD CULTURE RECEIVED NO GROWTH TO DATE CULTURE WILL BE HELD FOR 5 DAYS BEFORE ISSUING A FINAL NEGATIVE REPORT     Performed at  [facility]   Report Status PENDING   Incomplete  CULTURE, BLOOD (ROUTINE X 2)     Status: None   Collection Time    05/10/13 12:55 PM      Result Value Ref Range Status   Specimen Description BLOOD RIGHT HAND   Final   Special Requests BOTTLES DRAWN AEROBIC ONLY 5CC   Final   Culture  Setup Time     Final   Value: 05/10/2013 16:30     Performed at [facility]   Culture     Final   Value:        BLOOD CULTURE RECEIVED NO GROWTH TO DATE CULTURE WILL BE HELD FOR 5 DAYS BEFORE ISSUING A FINAL NEGATIVE REPORT     Performed at [facility]   Report Status PENDING   Incomplete  URINE CULTURE     Status: None   Collection Time    05/10/13  1:18 PM      Result Value Ref Range Status   Specimen Description URINE, CATHETERIZED   Final   Special Requests NONE   Final   Culture  Setup Time     Final   Value: 05/10/2013 14:33     Performed at [place]     Final   Value: NO GROWTH     Performed at [facility]   Culture     Final   Value: NO GROWTH     Performed at [facility]   Report Status 05/11/2013 FINAL   Final  MRSA PCR SCREENING     Status: None   Collection Time    05/10/13  9:32 PM      Result Value Ref Range Status   MRSA by PCR NEGATIVE  NEGATIVE Final   Comment:            The GeneXpert MRSA Assay (FDA     approved for NASAL specimens     only), is one component of a      comprehensive MRSA colonization     surveillance program. It is not     intended to diagnose MRSA     infection nor to guide or     monitor treatment for     MRSA infections.     Studies: US Paracentesis  05/15/2013   CLINICAL DATA:  Ascites, abdominal distention. Shortness of breath. Request paracentesis.  EXAM: ULTRASOUND GUIDED PARACENTESIS  COMPARISON:  None.  PROCEDURE: An ultrasound guided paracentesis was thoroughly discussed with the patient and questions answered. The benefits, risks, alternatives and complications were also discussed. The patient understands and wishes to proceed with the procedure. Written consent was obtained.  Ultrasound was performed to localize and mark an adequate pocket of fluid in the left lower quadrant of the abdomen. The area was then prepped and draped in the normal sterile fashion. 1% Lidocaine was used for local anesthesia. Under ultrasound guidance a 19 gauge Yueh catheter was introduced. Paracentesis was performed. The catheter was removed and a dressing applied.  COMPLICATIONS: None immediate  FINDINGS: A total of approximately 3 L of clear yellow fluid was removed. A fluid sample was sent for laboratory analysis.  IMPRESSION: Successful ultrasound guided paracentesis yielding 3 L of ascites.  Read by: Ascencion Dike PA-C   Electronically Signed   By: Jacqulynn Cadet M.D.   On: 05/15/2013 11:16    Scheduled Meds: . folic acid  1 mg Oral Daily  . furosemide  40 mg Oral Daily  . hydrocortisone sod succinate (SOLU-CORTEF) inj  25 mg Intravenous Once  . insulin aspart  0-9 Units Subcutaneous TID WC  . insulin aspart  4 Units Subcutaneous TID WC  . lactulose  30 g Oral BID  . midodrine  10 mg Oral TID WC  . pantoprazole  40 mg Oral BID WC  . potassium chloride SA  40 mEq Oral BID  . rifaximin  550 mg Oral BID  . spironolactone  100 mg Oral q morning - 10a  . thiamine  100 mg Oral Daily   Continuous Infusions:   Active Problems:   Acute respiratory  failure   Septic shock   Sepsis   Encephalopathy, hepatic   Coagulopathy    Time spent: [address] Sharen Heck, [place] [phone] [facility]  If 7PM-7AM, please contact night-coverage at [email or web address], password [facility] 05/15/2013, 12:29 PM  LOS: 5 days

## 2013-05-15 NOTE — Procedures (Signed)
Successful US guided paracentesis from LLQ.  Yielded 3L of clear yellow fluid.  No immediate complications.  Pt tolerated well.   Specimen was sent for labs.  Brayton ElKevin Tanza Pellot PA-C 05/15/2013 10:50 AM

## 2013-05-15 NOTE — Progress Notes (Signed)
NUTRITION FOLLOW-UP  DOCUMENTATION CODES Per approved criteria  -Not Applicable   INTERVENTION: RD provided education on "Low Sodium Nutrition Therapy" during this visit. Pt declined oral nutrition supplements. RD to continue to follow nutrition care plan.  NUTRITION DIAGNOSIS: Inadequate oral intake now related to GI distress AEB pt report.  Goal: Pt to meet >/= 90% of their estimated nutrition needs - unmet  Monitor:  Diet advancement, total protein/energy intake, labs, weights,  ASSESSMENT: 43 y/o F with multiple medical problems to include anxiety, depression, ongoing ETOH abuse, ETOH cirrhosis with esophageal varices (s/p multiple banding procedures last on here was done 03/2013 for UGI bleed), and recent admit to [facility] for bleeding varices who [facility] with AMS & hypotension in the setting of end stage liver disease.  Underwent US guided paracentesis from LLQ this morning in IR.  Pt reports that her appetite is poor. She has early satiety 2/2 her ascites. She has GI distress 2/2 lactulose and multiple medications. She states that she vomited earlier today and didn't want to eat anything for lunch, rather she just wanted to sip on her Sprite.   RD consulted for Low Sodium diet education. We reviewed low sodium foods and recommended avoiding high sodium foods.  Continues on lactulose at this time.  Height: Ht Readings from Last 1 Encounters:  05/13/13 5\' 4"  (1.626 m)    Weight: Wt Readings from Last 1 Encounters:  05/13/13 194 lb 1.6 oz (88.043 kg)  Admit wt 140 lb; pt reports usual body weight is around 150 lb, however it fluctuates  BMI:  24.1 - WNL (using admission weight)  Estimated Nutritional Needs: Kcal: 1900-2100 Protein: 85-95 gram Fluid: 1.9-2.1 L  Skin: intact  Diet Order: Sodium Restricted  EDUCATION NEEDS: -No education needs identified at this time   Intake/Output Summary (Last 24 hours) at 05/15/13 1104 Last data  filed at 05/15/13 0500  Gross per 24 hour  Intake    600 ml  Output      5 ml  Net    595 ml    Last BM: 5/12 - diarrhea  Labs:   Recent Labs Lab 05/11/13 0400  05/13/13 0430 05/14/13 0656 05/15/13 0435  NA  --   < > 141 145 142  K  --   < > 3.9 3.8 3.3*  CL  --   < > 112 115* 110  CO2  --   < > 18* 18* 21  BUN  --   < > 16 11 9   CREATININE  --   < > 0.51 0.53 0.49*  CALCIUM  --   < > 8.2* 8.3* 8.4  MG 1.6  --   --   --   --   PHOS 3.9  --   --   --   --   GLUCOSE  --   < > 240* 161* 144*  < > = values in this interval not displayed.  CBG (last 3)   Recent Labs  05/14/13 1652 05/14/13 2226 05/15/13 0754  GLUCAP 152* 142* 113*    Scheduled Meds: . folic acid  1 mg Oral Daily  . furosemide  40 mg Oral Daily  . hydrocortisone sod succinate (SOLU-CORTEF) inj  25 mg Intravenous Once  . insulin aspart  0-9 Units Subcutaneous TID WC  . insulin aspart  4 Units Subcutaneous TID WC  . lactulose  30 g Oral BID  . midodrine  10 mg Oral TID WC  .  pantoprazole  40 mg Oral BID WC  . potassium chloride SA  40 mEq Oral BID  . rifaximin  550 mg Oral BID  . spironolactone  100 mg Oral q morning - 10a  . thiamine  100 mg Oral Daily    Continuous Infusions:    Jarold MottoSamantha Kamyah Wilhelmsen MS, RD, LDN Inpatient Registered Dietitian Pager: [phone] After-hours pager: [phone]

## 2013-05-15 NOTE — Progress Notes (Signed)
Met with pt, at length, and various issues discussed.  Pt has many questions re: requirements for liver transplant list and is wanting to see if she qualifies for CAPS/PCS services through Medicaid.  Full assessment to follow.  CSW will f/u with pt on 05/16/13 after phone calls can be made on her behalf.

## 2013-05-15 NOTE — Consult Note (Addendum)
Referring Provider: Dr. Arthor Captain Primary Care Physician:  Tanna Furry, PA-C Primary Gastroenterologist:  Dr. Bosie Clos  Reason for Consultation:  Cirrhosis; Ascites  HPI: [name] is a 43 y.o. female recently admitted for confusion and hypotension in the setting of decompensated alcoholic cirrhosis. She also has PSC and UC. She was recently hospitalized in April for hepatic encephalopathy thought to be due to Lactulose noncompliance. Decompensation includes recurrent encephalopathy, variceal bleeding with banding (March 2015), and recurrent ascites. Her MELD score on admit was 29 (Cr 2.58) and 24 after initial hydration brought Cr to 1.46 (Cr 0.55 in Feb 2015). Denies rectal bleeding, N/V/abdominal pain prior to admit. She reports falling asleep easily the day of her admit and her boyfriend had her come to the hospital.   She does not think she has had any alcohol since early February but states she is not sure. She has been in alcohol counseling program although unknown how compliant she has been with it, and the program is a requirement for possible future liver transplant evaluation at [facility]. She reports being diagnosed with pneumonia recently prior to admit and was given an Abx that she did not take. Her systolic BPs were in the 50's-60's on admit. CCM note at admit reports that pt said a friend has continued to bring her alcohol. 3L ascitic fluid drained today that is negative for SBP. U/S showed possible portal vein thrombosis. Ammonia peak at 145 on 05/12/13. Vomited this morning after taking meds and eating. Feels ok now. Denies nausea. Nurse at bedside during evaluation.      Past Medical History  Diagnosis Date  . Sclerosing cholangitis   . Panic attacks   . Anxiety   . Alcohol abuse   . Ascites   . Cirrhosis of liver   . EtOH dependence 08/31/2012  . Family history of anesthesia complication     " my son had problems "  . Depression   . Anemia   . Hepatitis     Past Surgical  History  Procedure Laterality Date  . None    . Ercp    . Esophagogastroduodenoscopy N/A 09/10/2012    Procedure: ESOPHAGOGASTRODUODENOSCOPY (EGD);  Surgeon: Florencia Reasons, MD;  Location: [facility] ENDOSCOPY;  Service: Endoscopy;  Laterality: N/A;  . Esophagogastroduodenoscopy N/A 11/04/2012    Procedure: ESOPHAGOGASTRODUODENOSCOPY (EGD);  Surgeon: Barrie Folk, MD;  Location: [facility] ENDOSCOPY;  Service: Endoscopy;  Laterality: N/A;  . Esophagogastroduodenoscopy N/A 03/06/2013    Procedure: ESOPHAGOGASTRODUODENOSCOPY (EGD);  Surgeon: Shirley Friar, MD;  Location: [facility] ENDOSCOPY;  Service: Endoscopy;  Laterality: N/A;  Beside    Prior to Admission medications   Medication Sig Start Date End Date Taking? Authorizing Provider  ALPRAZolam Prudy Feeler) 1 MG tablet Take 1 mg by mouth every 6 (six) hours as needed for anxiety.   Yes Historical Provider, MD  buPROPion (WELLBUTRIN XL) 150 MG 24 hr tablet Take 150 mg by mouth daily.   Yes Historical Provider, MD  folic acid (FOLVITE) 800 MCG tablet Take 800 mcg by mouth daily.   Yes Historical Provider, MD  furosemide (LASIX) 20 MG tablet Take 20 mg by mouth 2 (two) times daily.    Yes Historical Provider, MD  lactulose (CHRONULAC) 10 GM/15ML solution Take 30 mLs (20 g total) by mouth 2 (two) times daily. 04/07/13  Yes Windell Hummingbird, MD  levofloxacin (LEVAQUIN) 750 MG tablet Take 750 mg by mouth daily.   Yes Historical Provider, MD  Multiple Vitamins-Minerals (WOMENS ONE DAILY) TABS Take 1 tablet  by mouth daily.   Yes Historical Provider, MD  pantoprazole (PROTONIX) 40 MG tablet Take 40 mg by mouth 2 (two) times daily.   Yes Historical Provider, MD  potassium chloride SA (K-DUR,KLOR-CON) 20 MEQ tablet Take 20 mEq by mouth 2 (two) times daily.   Yes Historical Provider, MD  propranolol (INDERAL) 20 MG tablet Take 20 mg by mouth every morning.    Yes Historical Provider, MD  spironolactone (ALDACTONE) 100 MG tablet Take 100 mg by mouth every morning.    Yes  Historical Provider, MD  thiamine (VITAMIN B-1) 100 MG tablet Take 100 mg by mouth daily.   Yes Historical Provider, MD  ursodiol (ACTIGALL) 300 MG capsule Take 300 mg by mouth 2 (two) times daily.   Yes Historical Provider, MD    Scheduled Meds: . folic acid  1 mg Oral Daily  . furosemide  40 mg Oral Daily  . insulin aspart  0-9 Units Subcutaneous TID WC  . insulin aspart  4 Units Subcutaneous TID WC  . lactulose  30 g Oral BID  . midodrine  10 mg Oral TID WC  . pantoprazole  40 mg Oral BID WC  . potassium chloride SA  40 mEq Oral BID  . rifaximin  550 mg Oral BID  . spironolactone  100 mg Oral q morning - 10a  . thiamine  100 mg Oral Daily   Continuous Infusions:  PRN Meds:.ondansetron (ZOFRAN) IV, traMADol  Allergies as of 05/10/2013  . (No Known Allergies)    Family History  Problem Relation Age of Onset  . CAD Father     History   Social History  . Marital Status: Single    Spouse Name: N/A    Number of Children: N/A  . Years of Education: N/A   Occupational History  . Not on file.   Social History Main Topics  . Smoking status: Current Every Day Smoker -- 0.50 packs/day for 20 years    Types: Cigarettes  . Smokeless tobacco: Never Used  . Alcohol Use: No     Comment: Pt drinks vodka/liquor every weekend, denies daily drinking...states she quit a month ago....  . Drug Use: Yes    Special: Marijuana     Comment: PT STATES SHE WENT THROUGH ETOH/DRUG PROGRAM AND DOES NOT DO EITHER ANYMORE  . Sexual Activity: Yes    Birth Control/ Protection: IUD   Other Topics Concern  . Not on file   Social History Narrative  . No narrative on file    Review of Systems: All negative from GI standpoint except as stated above in HPI.  Physical Exam: Vital signs: Filed Vitals:   05/15/13 1427  BP: 113/71  Pulse: 83  Temp: 98.9 F (37.2 C)  Resp: 18   Last BM Date: 05/14/13 General:   Awake, Alert,  Well-developed, well-nourished, pleasant and cooperative in  NAD HEENT: +scleral icterus, no oral lesions Neck: nontender, supple Lungs:  Clear throughout to auscultation.   No wheezes, crackles, or rhonchi. No acute distress. Heart:  Regular rate and rhythm; no murmurs, clicks, rubs,  or gallops. Abdomen: epigastric tenderness with guarding, soft, nondistended, +BS  Rectal:  Deferred Ext: 2+ pitting LE edema Neuro: oriented X 3; no asterixis Skin: +spider angiomas  GI:  Lab Results:  Recent Labs  05/13/13 0430 05/14/13 0656 05/15/13 0435  WBC 9.3 11.4* 9.8  HGB 8.4* 8.6* 9.2*  HCT 25.4* 26.5* 27.6*  PLT 79* 55* 97*   BMET  Recent Labs  05/13/13  0430 05/14/13 0656 05/15/13 0435  NA 141 145 142  K 3.9 3.8 3.3*  CL 112 115* 110  CO2 18* 18* 21  GLUCOSE 240* 161* 144*  BUN 16 11 9   CREATININE 0.51 0.53 0.49*  CALCIUM 8.2* 8.3* 8.4   LFT  Recent Labs  05/15/13 0435  PROT 5.0*  ALBUMIN 1.8*  AST 63*  ALT 28  ALKPHOS 239*  BILITOT 3.7*   PT/INR No results found for this basename: LABPROT, INR,  in the last 72 hours   Studies/Results: [place] Paracentesis  05/15/2013   CLINICAL DATA:  Ascites, abdominal distention. Shortness of breath. Request paracentesis.  EXAM: ULTRASOUND GUIDED PARACENTESIS  COMPARISON:  None.  PROCEDURE: An ultrasound guided paracentesis was thoroughly discussed with the patient and questions answered. The benefits, risks, alternatives and complications were also discussed. The patient understands and wishes to proceed with the procedure. Written consent was obtained.  Ultrasound was performed to localize and mark an adequate pocket of fluid in the left lower quadrant of the abdomen. The area was then prepped and draped in the normal sterile fashion. 1% Lidocaine was used for local anesthesia. Under ultrasound guidance a 19 gauge Yueh catheter was introduced. Paracentesis was performed. The catheter was removed and a dressing applied.  COMPLICATIONS: None immediate  FINDINGS: A total of approximately 3 L of  clear yellow fluid was removed. A fluid sample was sent for laboratory analysis.  IMPRESSION: Successful ultrasound guided paracentesis yielding 3 L of ascites.  Read by: Brayton ElKevin Bruning PA-C   Electronically Signed   By: Malachy MoanHeath  McCullough M.D.   On: 05/15/2013 11:16    Impression/Plan: 43 yo with decompensated cirrhosis with recurrent ascites, recent hepatic encephalopathy and variceal bleeding this year, who has a history of noncompliance with her meds and alcohol cessation. No SBP on today's paracentesis. No evidence of encephalopathy at this time. Strongly encouraged her to again be compliant with her meds, low salt diet (less than 2 grams per day), and alcohol cessation. NO role for liver transplant evaluation until she has completed with documentation of the time required for alcohol cessation per [facility] transplant program (she has previously been informed of the requirements by myself and [facility] transplant coordinator).   Would increase diuretics to see if that will help with her volume status and change to 150 mg of Aldactone daily (up from 100 mg) and change to Lasix 60 mg PO QD (up from 40 mg) with close monitoring of serum Cr, which is currently back to her baseline. Need strict I/O's recorded and order written. Would NOT recommend a TIPS due to her history of encephalopathy and try and maximize medical therapy for now. Will follow.    LOS: 5 days   Shirley FriarVincent C. Abigail Marsiglia  05/15/2013, 4:10 PM

## 2013-05-15 NOTE — Progress Notes (Signed)
Addendum  Patient seen and examined, chart and data base reviewed.  I agree with the above assessment and plan.  For full details please see [name] PA note.  Hepatic encephalopathy with cirrhosis. Resolved.  Adrenal insufficiency, relative. Patient received over 11+ liters since admission. Diuretics restarted.   Clint LippsMutaz A Neola Worrall, MD [facility] Pager: [phone] 05/15/2013, 2:03 PM

## 2013-05-16 ENCOUNTER — Inpatient Hospital Stay (HOSPITAL_COMMUNITY): Payer: Medicaid Other

## 2013-05-16 ENCOUNTER — Encounter (HOSPITAL_COMMUNITY): Payer: Self-pay

## 2013-05-16 DIAGNOSIS — D689 Coagulation defect, unspecified: Secondary | ICD-10-CM

## 2013-05-16 LAB — CBC WITH DIFFERENTIAL/PLATELET
BASOS ABS: 0.1 10*3/uL (ref 0.0–0.1)
BASOS PCT: 1 % (ref 0–1)
Eosinophils Absolute: 0.3 10*3/uL (ref 0.0–0.7)
Eosinophils Relative: 3 % (ref 0–5)
HCT: 27.7 % — ABNORMAL LOW (ref 36.0–46.0)
Hemoglobin: 8.9 g/dL — ABNORMAL LOW (ref 12.0–15.0)
LYMPHS PCT: 16 % (ref 12–46)
Lymphs Abs: 1.7 10*3/uL (ref 0.7–4.0)
MCH: 31 pg (ref 26.0–34.0)
MCHC: 32.1 g/dL (ref 30.0–36.0)
MCV: 96.5 fL (ref 78.0–100.0)
Monocytes Absolute: 1.1 10*3/uL — ABNORMAL HIGH (ref 0.1–1.0)
Monocytes Relative: 10 % (ref 3–12)
NEUTROS PCT: 70 % (ref 43–77)
Neutro Abs: 7.4 10*3/uL (ref 1.7–7.7)
PLATELETS: 97 10*3/uL — AB (ref 150–400)
RBC: 2.87 MIL/uL — ABNORMAL LOW (ref 3.87–5.11)
RDW: 22.8 % — ABNORMAL HIGH (ref 11.5–15.5)
WBC: 10.6 10*3/uL — ABNORMAL HIGH (ref 4.0–10.5)

## 2013-05-16 LAB — GLUCOSE, CAPILLARY
GLUCOSE-CAPILLARY: 111 mg/dL — AB (ref 70–99)
Glucose-Capillary: 147 mg/dL — ABNORMAL HIGH (ref 70–99)
Glucose-Capillary: 155 mg/dL — ABNORMAL HIGH (ref 70–99)
Glucose-Capillary: 131 mg/dL — ABNORMAL HIGH (ref 70–99)

## 2013-05-16 LAB — COMPREHENSIVE METABOLIC PANEL
ALBUMIN: 1.8 g/dL — AB (ref 3.5–5.2)
ALT: 29 U/L (ref 0–35)
AST: 61 U/L — ABNORMAL HIGH (ref 0–37)
Alkaline Phosphatase: 234 U/L — ABNORMAL HIGH (ref 39–117)
BUN: 8 mg/dL (ref 6–23)
CHLORIDE: 105 meq/L (ref 96–112)
CO2: 22 mEq/L (ref 19–32)
Calcium: 8.2 mg/dL — ABNORMAL LOW (ref 8.4–10.5)
Creatinine, Ser: 0.45 mg/dL — ABNORMAL LOW (ref 0.50–1.10)
GFR calc Af Amer: >90 mL/min (ref >90)
GFR calc non Af Amer: >90 mL/min (ref >90)
Glucose, Bld: 126 mg/dL — ABNORMAL HIGH (ref 70–99)
Potassium: 4.3 mEq/L (ref 3.7–5.3)
Sodium: 138 mEq/L (ref 137–147)
Total Bilirubin: 4.2 mg/dL — ABNORMAL HIGH (ref 0.3–1.2)
Total Protein: 5.4 g/dL — ABNORMAL LOW (ref 6.0–8.3)

## 2013-05-16 LAB — CULTURE, BLOOD (ROUTINE X 2)
Culture: NO GROWTH
Culture: NO GROWTH

## 2013-05-16 LAB — LIPASE, BLOOD: Lipase: 13 U/L (ref 11–59)

## 2013-05-16 LAB — PROTIME-INR
INR: 1.78 — ABNORMAL HIGH (ref 0.00–1.49)
PROTHROMBIN TIME: 20.2 s — AB (ref 11.6–15.2)

## 2013-05-16 LAB — PATHOLOGIST SMEAR REVIEW: Path Review: REACTIVE

## 2013-05-16 MED ORDER — IOHEXOL 300 MG/ML  SOLN
100.0000 mL | Freq: Once | INTRAMUSCULAR | Status: AC | PRN
Start: 2013-05-16 — End: 2013-05-16
  Administered 2013-05-16: 100 mL via INTRAVENOUS

## 2013-05-16 NOTE — Progress Notes (Signed)
PT Cancellation Note  Patient Details Name: [name] MRN: [id number] DOB: Aug 24, 1970   Cancelled Treatment:    Reason Eval/Treat Not Completed: Patient at procedure or test/unavailable, at imaging, will re attempt if time permits   Fabio AsaDevon J Breckyn Ticas 05/16/2013, 10:07 AM Charlotte Crumbevon Rajah Tagliaferro, PT DPT  [phone]

## 2013-05-16 NOTE — Progress Notes (Signed)
Addendum  Patient seen and examined, chart and data base reviewed.  I agree with the above assessment and plan.  For full details please see [name] PA note.  Hepatic encephalopathy, CT scan showed pneumobilia, MRCP to follow.   Clint LippsMutaz A Cleotha Tsang, MD [facility] Pager: [phone] 05/16/2013, 1:32 PM

## 2013-05-16 NOTE — Progress Notes (Signed)
Physical Therapy Treatment Patient Details Name: [name] MRN: [id number] DOB: 09/29/1970 Today's Date: 05/16/2013    History of Present Illness 43 yo with history of alcohol abuse and alcoholic cirrhosis admitted 5/8 with acute encephalopathy and hypotension.  Recent hospitalization due to bleeding esophageal varices.    PT Comments    Patient ambulating well despite new abdominal pain. Attempted musculoskeletal assessment and ROM, no relief. Will continue to see and progress mobility with patient  Follow Up Recommendations  Home health PT;Supervision - Intermittent     Equipment Recommendations  Rolling walker with 5" wheels    Recommendations for Other Services       Precautions / Restrictions Precautions Precautions: Fall Precaution Comments: most recent fall in January with injury to left hip.  Reports has herniated disc as well and intermittent pain in which difficulty walking, lifting leg into car and step up to get into home. Restrictions Weight Bearing Restrictions: No    Mobility  Bed Mobility Overal bed mobility: Modified Independent                Transfers Overall transfer level: Modified independent                  Ambulation/Gait Ambulation/Gait assistance: Modified independent (Device/Increase time);Supervision Ambulation Distance (Feet): 260 Feet (mod I for in room ambulation, supervision extended distance) Assistive device: None Gait Pattern/deviations: Step-through pattern;Decreased stride length;Shuffle;Wide base of support     General Gait Details: increased WOB as patient became fatigues   Information systems managertairs            Wheelchair Mobility    Modified Rankin (Stroke Patients Only)       Balance     Sitting balance-Leahy Scale: Good       Standing balance-Leahy Scale: Good                      Cognition Arousal/Alertness: Awake/alert Behavior During Therapy: WFL for tasks assessed/performed Overall Cognitive  Status: Within Functional Limits for tasks assessed                      Exercises      General Comments General comments (skin integrity, edema, etc.): performed RUE and thoracic ROM activities sitting EOB to assess for muscular skeletal pain  (thoracic extension, Scapular mobility UE flexion extensions)      Pertinent Vitals/Pain Abdominal pain R side    Home Living                      Prior Function            PT Goals (current goals can now be found in the care plan section) Acute Rehab PT Goals Patient Stated Goal: To go home PT Goal Formulation: With patient/family Time For Goal Achievement: 05/21/13 Potential to Achieve Goals: Good Progress towards PT goals: Progressing toward goals    Frequency  Min 3X/week    PT Plan Current plan remains appropriate    Co-evaluation             End of Session Equipment Utilized During Treatment: Gait belt Activity Tolerance: Patient tolerated treatment well Patient left: in bed;with call bell/phone within reach;Other (comment) (with social worker in room)     Time: [id number] PT Time Calculation (min): 23 min  Charges:  $Gait Training: 8-22 mins $Therapeutic Activity: 8-22 mins  G Codes:      Fabio AsaDevon J Rondal Vandevelde 05/16/2013, 3:51 PM Charlotte Crumbevon Jayron Maqueda, PT DPT  [phone]

## 2013-05-16 NOTE — Progress Notes (Signed)
[facility] PROGRESS NOTE  [name] BSJ:[id number] DOB: 1970/10/19 DOA: 05/10/2013 PCP: [name], PA-C  43 y/o F with hx of anxiety, depression, ongoing ETOH abuse, ETOH cirrhosis with esophageal varices (s/p multiple banding procedures - last one here 03/2013), and recent admit to [facility] for bleeding varices who presented to [facility] on 5/8 with AMS & hypotension in the setting of end stage liver disease. ER work up demonstrated profound hypotension with BP in the 76-54'Y systolic. Na 134, K 3.5, Cr 2.58 (up from 0.64), lactic acid 1.47, BNP 328, WBC 5.2, Hgb 9.9, platelets of 93, INR 1.86 (not on anticoagulation), urinalysis with many yeast / nitrate positive, CXR with cardiomegaly and mild vascular congestion.    Assessment/Plan: Hepatic encephalopathy in the setting of cirrhosis Patient admitted initially by the Critical Care Service for altered mental status with hypotension.  Ammonia levels elevated. 141 on 5/10. Lactulose dose decreased to 30 mg BID (titrate to 3-5 loose stools daily), rifaximin resumed.  Patient's has returned to baseline, A&OX3   Worsening Ascites Patient's diuretics were held on admission due to hypotension GI is presently titrating up diuretic therapy. 5/13 LEE and abdominal ascites were significantly worse.  (patient SOB).  Paracentesis performed 5/13.  3 liters removed. Ascitic fluid negative for SBP  Adrenal insufficiency with hypotension  cortisol 3.8 in setting of hypotension  On admission required a central line and pressors.  Requiring solu-cortef and midodrine. Solucortef completed on 5/13.  Continue on midodrine.  Coagulopathy / thrombocytopenia in setting of liver disease  Multiple bruises on her body, no active bleed  INR 1.7 - 1.8 Platelets 55 on 5/12.  Increased 5/13 to 97. Will monitor.  Elevated LFTs/Obstructive Jaundice / RUQ pain. Particularly Alk phos and Tbili - No baseline labs in EPIC.  Ultrasound shows thickened GB  wall with sludge and normal CBD.  CT Abdomen shows Pneumobilia.   Will follow with MRCP  Appreciate [facility] Consultation.  ? Portal Vein Thrombosis on Ultrasound.  Portal Veins are patent on CT Abdomen. Per GI no further work up.  Unable to anticoagulate due to recent variceal bleed.  Cirrhosis of liver w/ esophageal varices  Recently discharged from [facility] for esophageal bleed  Denies vomiting, blood in the stool  Currently quiet.  Acute hypoxemic respiratory failure  Secondary to pulmonary edema  Resolved as noted on CXR on 05/13/2013  Resumed home doses of Lasix and Spironolactone on 5/12  Acute kidney failure- Prerenal  Dehydration? Due to encephalopathy  Presented with liver failure and kidney failure creatine 2.58  5/11 creatine @ 0.51 - resolved   Continued EtOH abuse  Counseling and social work referral    Code Status: Full Family Communication: none Disposition Plan: Inpatient.  Discharge when appropriate.  Consultants:  [facility]  Procedures:  Paracentesis  Antibiotics:  rifaximin   Objective: Filed Vitals:   05/16/13 0412  BP: 105/62  Pulse: 91  Temp: 99.2 F (37.3 C)  Resp: 18   No intake or output data in the 24 hours ending 05/16/13 1311 Filed Weights   05/10/13 1643 05/13/13 0400 05/13/13 1316  Weight: 63.504 kg (140 lb) 88.1 kg (194 lb 3.6 oz) 88.043 kg (194 lb 1.6 oz)    Exam:  Physical Exam  Constitutional: oriented to person, place, and time. appears well-developed and well-nourished. No distress.  HENT: Eyes: icterus +2 . Pupils are equal, round, and reactive to light.  Neck: No JVD present, supple  Cardiovascular: Normal rate, regular rhythm and normal heart sounds.  Respiratory: Effort normal and breath sounds decreased. No stridor. No respiratory distress. no wheezes. no tenderness.  GI: Distended but Soft. Bowel sounds are normal. TTP in right upper Quadrant.  No frank mass. Musculoskeletal: Normal range of motion. no  tenderness.  Neurological: alert and oriented to person, place, and time.  Skin: Skin is warm and dry. not diaphoretic. No erythema. Jaundiced 2+ Psychiatric: normal mood and affect. speech is normal and behavior is normal.   Data Reviewed: Basic Metabolic Panel:  Recent Labs Lab 05/11/13 0400  05/12/13 0600 05/13/13 0430 05/14/13 0656 05/15/13 0435 05/16/13 0900  NA  --   < > 141 141 145 142 138  K  --   < > 3.3* 3.9 3.8 3.3* 4.3  CL  --   < > 110 112 115* 110 105  CO2  --   < > 20 18* 18* 21 22  GLUCOSE  --   < > 216* 240* 161* 144* 126*  BUN  --   < > 20 16 11 9 8   CREATININE  --   < > 0.80 0.51 0.53 0.49* 0.45*  CALCIUM  --   < > 7.8* 8.2* 8.3* 8.4 8.2*  MG 1.6  --   --   --   --   --   --   PHOS 3.9  --   --   --   --   --   --   < > = values in this interval not displayed. Liver Function Tests:  Recent Labs Lab 05/11/13 0400 05/11/13 1155 05/14/13 0656 05/15/13 0435 05/16/13 0900  AST 62* 68* 58* 63* 61*  ALT 19 20 25 28 29   ALKPHOS 231* 235* 249* 239* 234*  BILITOT 4.5* 5.0* 3.1* 3.7* 4.2*  PROT 4.8* 4.9* 5.0* 5.0* 5.4*  ALBUMIN 1.8* 1.7* 1.7* 1.8* 1.8*     Recent Labs Lab 05/10/13 1157 05/11/13 0400 05/12/13 1045 05/14/13 0500  AMMONIA 56 83* 141* 110*   CBC:  Recent Labs Lab 05/10/13 1158  05/12/13 0600 05/13/13 0430 05/14/13 0656 05/15/13 0435 05/16/13 0900  WBC 5.2  < > 7.7 9.3 11.4* 9.8 10.6*  NEUTROABS 3.4  --   --   --   --   --  7.4  HGB 9.9*  < > 9.1* 8.4* 8.6* 9.2* 8.9*  HCT 30.2*  < > 27.4* 25.4* 26.5* 27.6* 27.7*  MCV 97.1  < > 96.5 95.8 95.7 94.8 96.5  PLT 93*  < > 98* 79* 55* 97* 97*  < > = values in this interval not displayed. Cardiac Enzymes:  Recent Labs Lab 05/10/13 1627  TROPONINI <0.30   BNP (last 3 results)  Recent Labs  05/10/13 1317  PROBNP 328.6*   CBG:  Recent Labs Lab 05/15/13 1208 05/15/13 1719 05/15/13 2108 05/16/13 0803 05/16/13 1202  GLUCAP 113* 211* 231* 147* 111*    Recent Results  (from the past 240 hour(s))  CULTURE, BLOOD (ROUTINE X 2)     Status: None   Collection Time    05/10/13 12:50 PM      Result Value Ref Range Status   Specimen Description BLOOD RIGHT ANTECUBITAL   Final   Special Requests BOTTLES DRAWN AEROBIC ONLY 5CC   Final   Culture  Setup Time     Final   Value: 05/10/2013 16:30     Performed at [facility]   Culture     Final   Value: NO GROWTH 5 DAYS  Performed at [facility]   Report Status 05/16/2013 FINAL   Final  CULTURE, BLOOD (ROUTINE X 2)     Status: None   Collection Time    05/10/13 12:55 PM      Result Value Ref Range Status   Specimen Description BLOOD RIGHT HAND   Final   Special Requests BOTTLES DRAWN AEROBIC ONLY 5CC   Final   Culture  Setup Time     Final   Value: 05/10/2013 16:30     Performed at [facility]   Culture     Final   Value: NO GROWTH 5 DAYS     Performed at [facility]   Report Status 05/16/2013 FINAL   Final  URINE CULTURE     Status: None   Collection Time    05/10/13  1:18 PM      Result Value Ref Range Status   Specimen Description URINE, CATHETERIZED   Final   Special Requests NONE   Final   Culture  Setup Time     Final   Value: 05/10/2013 14:33     Performed at [facility]     Final   Value: NO GROWTH     Performed at [facility]   Culture     Final   Value: NO GROWTH     Performed at [facility]   Report Status 05/11/2013 FINAL   Final  MRSA PCR SCREENING     Status: None   Collection Time    05/10/13  9:32 PM      Result Value Ref Range Status   MRSA by PCR NEGATIVE  NEGATIVE Final   Comment:            The GeneXpert MRSA Assay (FDA     approved for NASAL specimens     only), is one component of a     comprehensive MRSA colonization     surveillance program. It is not     intended to diagnose MRSA     infection nor to guide or     monitor treatment for     MRSA infections.  BODY FLUID CULTURE      Status: None   Collection Time    05/15/13 10:43 AM      Result Value Ref Range Status   Specimen Description ASCITIC ABDOMEN FLUID   Final   Special Requests 60 ML FLUID   Final   Gram Stain     Final   Value: NO WBC SEEN     NO ORGANISMS SEEN     Performed at [facility]   Culture PENDING   Incomplete   Report Status PENDING   Incomplete     Studies: Ct Abdomen Pelvis W Contrast  05/16/2013   CLINICAL DATA:  Evaluate portal vein patency  EXAM: CT ABDOMEN AND PELVIS WITH CONTRAST  TECHNIQUE: Multidetector CT imaging of the abdomen and pelvis was performed using the standard protocol following bolus administration of intravenous contrast.  CONTRAST:  138m OMNIPAQUE IOHEXOL 300 MG/ML  SOLN  COMPARISON:  CT ABD/PELVIS W CM dated 06/29/2012  FINDINGS: The portal vein is patent. There is recanalization of the umbilical vein. Splenic vein and superior mesenteric veins are patent. Right and left hepatic veins are grossly patent. Middle hepatic vein is not clearly visualized.  Ground-glass is present at the base of the right middle and lower lobes.  Pneumobilia is present. There is also  air in the pancreatic duct and gallbladder.  The liver remains severely cirrhotic.  Enhancement is heterogeneous.  Gallbladder wall thickening.  There is low-density within the region of the of the pancreatic head. There is stranding about the pancreas, but also throughout the mesenteric.  Moderate ascites.  Spleen, kidneys, adrenal glands are within normal limits.  Unremarkable bladder and uterus.  IUD in place.  Bilateral sacral insufficiency fractures are present. Pubic rami are intact.  IMPRESSION: The portal vein is patent.  Ground-glass opacity at the base of the right middle and lower lobes. Edema or an inflammatory process are not excluded.  Pneumobilia.  This can be seen after biliary instrumentation.  Sacral insufficiency fractures bilaterally.  There is low-density within the pancreatic head. Correlate  with amylase and lipase as for the possibility of an inflammatory process. There is stranding about the pancreas, but also throughout the mesenteric fat which is probably simply related to volume overload or anasarca.   Electronically Signed   By: Maryclare Bean M.D.   On: 05/16/2013 10:46   US Paracentesis  05/15/2013   CLINICAL DATA:  Ascites, abdominal distention. Shortness of breath. Request paracentesis.  EXAM: ULTRASOUND GUIDED PARACENTESIS  COMPARISON:  None.  PROCEDURE: An ultrasound guided paracentesis was thoroughly discussed with the patient and questions answered. The benefits, risks, alternatives and complications were also discussed. The patient understands and wishes to proceed with the procedure. Written consent was obtained.  Ultrasound was performed to localize and mark an adequate pocket of fluid in the left lower quadrant of the abdomen. The area was then prepped and draped in the normal sterile fashion. 1% Lidocaine was used for local anesthesia. Under ultrasound guidance a 19 gauge Yueh catheter was introduced. Paracentesis was performed. The catheter was removed and a dressing applied.  COMPLICATIONS: None immediate  FINDINGS: A total of approximately 3 L of clear yellow fluid was removed. A fluid sample was sent for laboratory analysis.  IMPRESSION: Successful ultrasound guided paracentesis yielding 3 L of ascites.  Read by: Ascencion Dike PA-C   Electronically Signed   By: Jacqulynn Cadet M.D.   On: 05/15/2013 11:16    Scheduled Meds: . folic acid  1 mg Oral Daily  . furosemide  20 mg Oral Daily  . furosemide  40 mg Oral Daily  . insulin aspart  0-9 Units Subcutaneous TID WC  . insulin aspart  4 Units Subcutaneous TID WC  . lactulose  30 g Oral BID  . midodrine  10 mg Oral TID WC  . pantoprazole  40 mg Oral BID WC  . potassium chloride SA  40 mEq Oral BID  . rifaximin  550 mg Oral BID  . spironolactone  150 mg Oral q morning - 10a  . thiamine  100 mg Oral Daily   Continuous  Infusions:   Active Problems:   Acute respiratory failure   Septic shock   Sepsis   Encephalopathy, hepatic   Coagulopathy    Time spent: [address] [place] [facility]  If 7PM-7AM, please contact night-coverage at [email or web address], password [facility] 05/16/2013, 1:11 PM  LOS: 6 days

## 2013-05-16 NOTE — Progress Notes (Addendum)
Patient ID: Gearldine Shownnita Segers, female   DOB: [birth year], 43 y.o.   MR[facility]: [id number] [facility]  Gearldine Shownnita Coor 43 y.o. [birth year]   Subjective: Complaining of RUQ pain overnight and today that she has not been having. S/P paracentesis yesterday in [facility] where 3 L removed (negative for SBP). Denies N/V. Reports urinating overnight and today but no output recorded (pt urinated in toilet).  Objective: Vital signs in last 24 hours: Filed Vitals:   05/16/13 0412  BP: 105/62  Pulse: 91  Temp: 99.2 F (37.3 C)  Resp: 18    Physical Exam: Gen: alert, no acute distress HEENT: scleral icterus CV: RRR Chest: CTA anteriorly Abd: RUQ tenderness with guarding, +distention, +BS  Lab Results:  Recent Labs  05/14/13 0656 05/15/13 0435  NA 145 142  K 3.8 3.3*  CL 115* 110  CO2 18* 21  GLUCOSE 161* 144*  BUN 11 9  CREATININE 0.53 0.49*  CALCIUM 8.3* 8.4    Recent Labs  05/14/13 0656 05/15/13 0435  AST 58* 63*  ALT 25 28  ALKPHOS 249* 239*  BILITOT 3.1* 3.7*  PROT 5.0* 5.0*  ALBUMIN 1.7* 1.8*    Recent Labs  05/14/13 0656 05/15/13 0435  WBC 11.4* 9.8  HGB 8.6* 9.2*  HCT 26.5* 27.6*  MCV 95.7 94.8  PLT 55* 97*   No results found for this basename: LABPROT, INR,  in the last 72 hours    Assessment/Plan: Decompensated cirrhosis with ascites, who is now having RUQ pain. No sign of encephalopathy. No SBP on paracentesis yesterday. Question of portal vein thrombosis (PVT) on previous U/S but due to her recent variceal bleed with banding in March, I expressed to the primary team that the risk of anticoagulation, if she has an acute PVT, outweighs the benefit. Her increased RUQ pain could be due to perihepatic ascites pressing on the liver. PVT could be causing this pain as well. Doubt gallbladder source. She has PSC but has not shown any signs of biliary obstruction (mild elevation in ALP, TB on admit has decreased to her baseline).   Diuretic doses increased  yesterday and discussed with nurse need for strict I/Os so her response to the increase in diuretics can be monitored closely. No labs noted so will order. Abd CT with IV contrast ordered to further evaluate her abdominal pain and look for portal vein thrombosis. NPO until CT done and reviewed. Will follow. Patient aware of plan.   Shirley FriarVincent C. Miner Koral 05/16/2013, 8:35 AM

## 2013-05-16 NOTE — Progress Notes (Signed)
Clinical Social Work Department BRIEF PSYCHOSOCIAL ASSESSMENT 05/16/2013  Patient:  Gearldine ShownUNE,Barbra     Account Number:  [id number]     Admit date:  05/10/2013  Clinical Social Worker:  Illene SilverRAKE,Argentina Kosch, LCSW  Date/Time:  05/16/2013 03:17 AM  Referred by:  CSW  Date Referred:  05/15/2013 Referred for  Psychosocial assessment   Other Referral:   Interview type:  Patient Other interview type:    PSYCHOSOCIAL DATA Living Status:  WITH MINOR CHILDREN Admitted from facility:   Level of care:   Primary support name:  paul lewis Primary support relationship to patient:  FRIEND Degree of support available:   Very good, per pt report.    CURRENT CONCERNS Current Concerns  Adjustment to Illness   Other Concerns:    SOCIAL WORK ASSESSMENT / PLAN CSW spoke with pt, at length, re: various issues.  Pt expressed needing help at home because she is limited as to what she can do physically because of her abdominal swelling.  (CM c/s for HHC, noted).  Pt also unclear about what she needs to do as far as SA tx to [facility], although she admits speaking with a transplant advisor x 1 month ago.  Pt lives in public housing with her son (7 yrs) and is very satisfied with her living arrangement.  She has a good friend, Renae Fickleaul, who is able to help her with childcare and transportation, but has no other family in area. Pt has no car or DL.  Medicaid is her insurance.   Assessment/plan status:  [facility] Other assessment/ plan:   Information/referral to community resources:   CSW will call [facility] to confirm abstinence requirements for pt.  CSW also referred pt to Medicaid Adult Services to explore eligibility for custodial care services.    PATIENT'S/FAMILY'S RESPONSE TO PLAN OF CARE: Pt overwhelmed.  She is confused about where to go for alcohol treatment and want programs will satisfy both [facility] and Medicaid requirements for the transplant  list. Emotional support offered.

## 2013-05-17 ENCOUNTER — Inpatient Hospital Stay (HOSPITAL_COMMUNITY): Payer: Medicaid Other

## 2013-05-17 LAB — GLUCOSE, CAPILLARY
Glucose-Capillary: 111 mg/dL — ABNORMAL HIGH (ref 70–99)
Glucose-Capillary: 175 mg/dL — ABNORMAL HIGH (ref 70–99)
Glucose-Capillary: 151 mg/dL — ABNORMAL HIGH (ref 70–99)

## 2013-05-17 LAB — HEPATIC FUNCTION PANEL
ALBUMIN: 1.7 g/dL — AB (ref 3.5–5.2)
ALK PHOS: 212 U/L — AB (ref 39–117)
ALT: 26 U/L (ref 0–35)
AST: 56 U/L — AB (ref 0–37)
BILIRUBIN TOTAL: 4 mg/dL — AB (ref 0.3–1.2)
Bilirubin, Direct: 2 mg/dL — ABNORMAL HIGH (ref 0.0–0.3)
Indirect Bilirubin: 2 mg/dL — ABNORMAL HIGH (ref 0.3–0.9)
Total Protein: 5.2 g/dL — ABNORMAL LOW (ref 6.0–8.3)

## 2013-05-17 LAB — CBC
HEMATOCRIT: 25.7 % — AB (ref 36.0–46.0)
HEMOGLOBIN: 8.4 g/dL — AB (ref 12.0–15.0)
MCH: 31.5 pg (ref 26.0–34.0)
MCHC: 32.7 g/dL (ref 30.0–36.0)
MCV: 96.3 fL (ref 78.0–100.0)
Platelets: 104 10*3/uL — ABNORMAL LOW (ref 150–400)
RBC: 2.67 MIL/uL — ABNORMAL LOW (ref 3.87–5.11)
RDW: 22.7 % — ABNORMAL HIGH (ref 11.5–15.5)
WBC: 10.3 10*3/uL (ref 4.0–10.5)

## 2013-05-17 LAB — BASIC METABOLIC PANEL
BUN: 7 mg/dL (ref 6–23)
CHLORIDE: 103 meq/L (ref 96–112)
CO2: 25 mEq/L (ref 19–32)
Calcium: 8.2 mg/dL — ABNORMAL LOW (ref 8.4–10.5)
Creatinine, Ser: 0.48 mg/dL — ABNORMAL LOW (ref 0.50–1.10)
GFR calc non Af Amer: >90 mL/min (ref >90)
Glucose, Bld: 115 mg/dL — ABNORMAL HIGH (ref 70–99)
Potassium: 3.9 mEq/L (ref 3.7–5.3)
Sodium: 139 mEq/L (ref 137–147)

## 2013-05-17 MED ORDER — GADOBENATE DIMEGLUMINE 529 MG/ML IV SOLN
20.0000 mL | Freq: Once | INTRAVENOUS | Status: AC
  Administered 2013-05-17: 18 mL via INTRAVENOUS

## 2013-05-17 MED ORDER — POTASSIUM CHLORIDE CRYS ER 20 MEQ PO TBCR: 40.0000 meq | EXTENDED_RELEASE_TABLET | Freq: Every day | ORAL | Status: AC

## 2013-05-17 MED ORDER — RIFAXIMIN 550 MG PO TABS: 550.0000 mg | ORAL_TABLET | Freq: Two times a day (BID) | ORAL | Status: AC

## 2013-05-17 MED ORDER — MIDODRINE HCL 10 MG PO TABS: 10.0000 mg | ORAL_TABLET | Freq: Three times a day (TID) | ORAL | Status: AC

## 2013-05-17 MED ORDER — SPIRONOLACTONE 50 MG PO TABS: 150.0000 mg | ORAL_TABLET | Freq: Every morning | ORAL | Status: AC

## 2013-05-17 MED ORDER — FUROSEMIDE 10 MG/ML IJ SOLN
40.0000 mg | Freq: Once | INTRAMUSCULAR | Status: AC
  Administered 2013-05-17: 40 mg via INTRAVENOUS
  Filled 2013-05-17: qty 4

## 2013-05-17 MED ORDER — FUROSEMIDE 20 MG PO TABS: 80.0000 mg | ORAL_TABLET | Freq: Every day | ORAL | Status: AC

## 2013-05-17 MED ORDER — TRAMADOL HCL 50 MG PO TABS: 50.0000 mg | ORAL_TABLET | Freq: Four times a day (QID) | ORAL | Status: AC | PRN

## 2013-05-17 MED ORDER — FUROSEMIDE 20 MG PO TABS: 60.0000 mg | ORAL_TABLET | Freq: Two times a day (BID) | ORAL | Status: DC

## 2013-05-17 MED ORDER — FLUCONAZOLE 150 MG PO TABS
150.0000 mg | ORAL_TABLET | Freq: Once | ORAL | Status: DC
  Filled 2013-05-17: qty 1

## 2013-05-17 MED ORDER — LACTULOSE 10 GM/15ML PO SOLN: 30.0000 g | Freq: Two times a day (BID) | ORAL | Status: DC

## 2013-05-17 MED ORDER — FUROSEMIDE 80 MG PO TABS: 80.0000 mg | ORAL_TABLET | Freq: Two times a day (BID) | ORAL | Status: DC

## 2013-05-17 NOTE — Progress Notes (Signed)
Nsg Discharge Note  Admit Date:  05/10/2013 Discharge date: 05/17/2013   Gearldine Shownnita Lafata to be D/C'd Home per MD order.  AVS completed.  Copy for chart, and copy for patient signed, and dated. Patient/caregiver able to verbalize understanding.  Discharge Medication:   Medication List    STOP taking these medications       ALPRAZolam 1 MG tablet  Commonly known as:  XANAX     levofloxacin 750 MG tablet  Commonly known as:  LEVAQUIN      TAKE these medications       buPROPion 150 MG 24 hr tablet  Commonly known as:  WELLBUTRIN XL  Take 150 mg by mouth daily.     folic acid 800 MCG tablet  Commonly known as:  FOLVITE  Take 800 mcg by mouth daily.     furosemide 20 MG tablet  Commonly known as:  LASIX  Take 4 tablets (80 mg total) by mouth daily.     lactulose 10 GM/15ML solution  Commonly known as:  CHRONULAC  Take 45 mLs (30 g total) by mouth 2 (two) times daily.     midodrine 10 MG tablet  Commonly known as:  PROAMATINE  Take 1 tablet (10 mg total) by mouth 3 (three) times daily with meals.     pantoprazole 40 MG tablet  Commonly known as:  PROTONIX  Take 40 mg by mouth 2 (two) times daily.     potassium chloride SA 20 MEQ tablet  Commonly known as:  K-DUR,KLOR-CON  Take 2 tablets (40 mEq total) by mouth daily.     propranolol 20 MG tablet  Commonly known as:  INDERAL  Take 20 mg by mouth every morning.     rifaximin 550 MG Tabs tablet  Commonly known as:  XIFAXAN  Take 1 tablet (550 mg total) by mouth 2 (two) times daily.     spironolactone 50 MG tablet  Commonly known as:  ALDACTONE  Take 3 tablets (150 mg total) by mouth every morning.     thiamine 100 MG tablet  Commonly known as:  VITAMIN B-1  Take 100 mg by mouth daily.     traMADol 50 MG tablet  Commonly known as:  ULTRAM  Take 1 tablet (50 mg total) by mouth every 6 (six) hours as needed for moderate pain.     ursodiol 300 MG capsule  Commonly known as:  ACTIGALL  Take 300 mg by mouth 2 (two)  times daily.     WOMENS ONE DAILY Tabs  Take 1 tablet by mouth daily.        Discharge Assessment: Filed Vitals:   05/17/13 1400  BP: 112/71  Pulse: 81  Temp: 99 F (37.2 C)  Resp: 18   Skin clean, dry and intact without evidence of skin break down, no evidence of skin tears noted. IV catheter discontinued intact. Site without signs and symptoms of complications - no redness or edema noted at insertion site, patient denies c/o pain - only slight tenderness at site.  Dressing with slight pressure applied.  D/c Instructions-Education: Discharge instructions given to patient/family with verbalized understanding. D/c education completed with patient/family including follow up instructions, medication list, d/c activities limitations if indicated, with other d/c instructions as indicated by MD - patient able to verbalize understanding, all questions fully answered. Patient instructed to return to ED, call 911, or call MD for any changes in condition.  Patient escorted via WC, and D/C home via private auto.  Grover CanavanKrystal A  Saul Fabiano, RN 05/17/2013 4:39 PM

## 2013-05-17 NOTE — Care Management Note (Signed)
    Page 1 of 2   05/17/2013     4:42:40 PM CARE MANAGEMENT NOTE 05/17/2013  Patient:  [name],[name]   Account Number:  [id number]  Date Initiated:  05/12/2013  Documentation initiated by:  Avie ArenasBROWN,SARAH  Subjective/Objective Assessment:   Admitted with AMS - Sepsis     Action/Plan:   Anticipated DC Date:  05/17/2013   Anticipated DC Plan:  HOME W HOME HEALTH SERVICES      DC Planning Services  CM consult      [facility]   Choice offered to / List presented to:  C-1 Patient   DME arranged  Levan HurstWALKER - ROLLING      DME agency  [facility]     [facility]  [facility] RN  HH-6 SOCIAL WORKER      [facility]  [facility]   Status of service:  Completed, signed off Medicare Important Message given?   (If response is "NO", the following Medicare IM given date fields will be blank) Date Medicare IM given:   Date Additional Medicare IM given:    Discharge Disposition:  HOME W HOSPICE CARE  Per UR Regulation:  Reviewed for med. necessity/level of care/duration of stay  If discussed at Long Length of Stay Meetings, dates discussed:    Comments:  Contact:  [name] Significant other [phone]                 [facility] Medical CenterJagnandan,Alice Mother [phone]  05/17/13 1633 Letha Capeeborah Grady Lucci RN, BSN [phone] [facility], and rolling walker. [facility].  Soc will begin 24-48 hrs post discharge.

## 2013-05-17 NOTE — Discharge Summary (Signed)
Addendum  Patient seen and examined, chart and data base reviewed.  I agree with the above assessment and plan.  For full details please see Mrs. [name] DownsMarianne York PA note.  Admitted with hepatic encephalopathy, patient back to her baseline, discharged on lactulose and rifaximin.  Has severe fluid retention, worsening ascites, she did have lower extremity edema up to her pelvis, paracentesis with removal of 3 L, diuresis increased. Please watch renal function closely.   [name] LippsMutaz A Joren Rehm, MD [facility] Pager: [phone] 05/17/2013, 3:01 PM

## 2013-05-17 NOTE — Discharge Summary (Signed)
Physician Discharge Summary  [name] BDZ:[id number] DOB: August 18, 1970 DOA: 05/10/2013  PCP: [name] Jaeger, PA-C  Admit date: 05/10/2013 Discharge date: 05/17/2013  Time spent:60 minutes  Recommendations for Outpatient Follow-up:  1. Significant Ascites - Diuretics increased.  Please check BMET on 5/18 or 5/19 2. Started on midodrine to maintain blood pressure.  Please re-eval to determine if midodrine can be discontinued. 3. Hepatic Encephalopathy - restarted on Lactulose 30 ml bid 4. Xanax discontinued.  Discharge Diagnoses:  Active Problems:   Acute respiratory failure   Septic shock   Sepsis   Encephalopathy, hepatic   Coagulopathy   Discharge Condition: stable.  Still with significant ascites  Diet recommendation:  Low salt stressed to patient.  Filed Weights   05/10/13 1643 05/13/13 0400 05/13/13 1316  Weight: 63.504 kg (140 lb) 88.1 kg (194 lb 3.6 oz) 88.043 kg (194 lb 1.6 oz)    History of present illness:  43 y/o F with a PMH inclusive of anxiety, depression, ongoing ETOH abuse, ETOH cirrhosis with esophageal varices (s/p multiple banding procedures last on here was done 03/2013 for UGI bleed), and recent admit to [name] for bleeding varices who presented to [facility] on 5/8 with AMS & hypotension in the setting of end stage liver disease. Patients boyfriend report's she has been dizzy & weak since discharge. This has been progressive in nature.  ER work up demonstrated profound hypotension with BP in the 34-19'Q systolic. Na 134, K 3.5, Cr 2.58 (up from 0.64), lactic acid 1.47, BNP, 328, WBC 5.2, Hgb 9.9, platelets of 93, INR 1.86 (not on anticoagulation), urinalysis with many yeast / nitrate positive, CXR with cardiomegaly and mild vascular congestion.  [name] was admitted to the Critical Care Service.   Hospital Course:   Hepatic encephalopathy in the setting of cirrhosis  Patient admitted initially by the Critical Care Service for altered mental status with  hypotension.  (SBP in 60s) Ammonia levels elevated. 141 on 5/10.  Xanax has been discontinued. Lactulose dose decreased to 30 mg BID (titrate to 3-5 loose stools daily), rifaximin resumed and prescribed on d/c. Patient's has returned to baseline, A&OX3   Worsening Ascites  Patient's diuretics were held on admission due to hypotension  GI was consulted and titrated up diuretics. 5/13 LEE and abdominal ascites were significantly worse. (patient SOB). Paracentesis performed 5/13. 3 liters removed.  Ascitic fluid negative for SBP  Ascites re-accumulated quickly.  Diuretics and potassium were again titrated up prior to d/c.   Follow up with PCP on Monday 5/18 for lab work and with GI on 5/21.  Adrenal insufficiency with hypotension  cortisol 3.8 in setting of hypotension  On admission required a central line and pressors.  Requiring solu-cortef and midodrine. Solucortef completed on 5/13. Continue on midodrine after discharge BP is currently stable (SBP low 100s) on midodrine.   Will request PCP / GI to evaluate when to D/C midodrine.  Coagulopathy / thrombocytopenia in setting of liver disease  Multiple bruises on her body, no active bleed  INR 1.7 - 1.8  Platelets 55 on 5/12. Increased 5/13 to 97.  Will monitor outpatient  Elevated LFTs/Obstructive Jaundice / RUQ pain with Pneumobilia. Particularly Alk phos and Tbili.  Stable per GI.  Ultrasound shows thickened GB wall with sludge and normal CBD.  CT Abdomen shows Pneumobilia. Follow up MRCP indicated this was chronic and consistent with prior sphincterotomy. Appreciate [facility].   ?Portal Vein Thrombosis on Ultrasound.  Portal Veins are patent on CT Abdomen.  Cirrhosis of liver w/ esophageal varices  Recently discharged from [facility] for esophageal bleed  Denies vomiting, blood in the stool  Currently quiet. Continue propanolol.  Acute hypoxemic respiratory failure  Secondary to pulmonary edema  Resolved as  noted on CXR on 05/13/2013  Resumed home doses of Lasix and Spironolactone on 5/12.  These have since been increased.  Acute kidney failure- Prerenal  Dehydration? Due to encephalopathy and decreased PO intake. Presented with liver failure and kidney failure creatine 2.58  5/11 creatine @ 0.51 - resolved   Continued EtOH abuse  Patient was counseled extensively not to drink Social work provided information about where to go for ETOH abuse treatment.   Consultations:  Gastroenterology  Discharge Exam: Filed Vitals:   05/17/13 1400  BP: 112/71  Pulse: 81  Temp: 99 F (37.2 C)  Resp: 18   Constitutional: oriented to person, place, and time. appears well-developed and well-nourished. No distress.  HENT: Eyes: icterus +2 . Pupils are equal, round, and reactive to light.  Neck: No JVD present, supple  Cardiovascular: Normal rate, regular rhythm and normal heart sounds.  Respiratory: Effort normal and breath sounds decreased. No stridor. No respiratory distress. no wheezes. no tenderness.  GI: Distended but Soft. Bowel sounds are normal. TTP in right upper Quadrant. No frank mass. Musculoskeletal: Normal range of motion. no tenderness.  Neurological: alert and oriented to person, place, and time.  Skin: Skin is warm and dry. not diaphoretic. No erythema. Jaundiced 2+   Discharge Instructions       Discharge Instructions   Diet - low sodium heart healthy    Complete by:  As directed      Increase activity slowly    Complete by:  As directed             Medication List    STOP taking these medications       ALPRAZolam 1 MG tablet  Commonly known as:  XANAX     levofloxacin 750 MG tablet  Commonly known as:  LEVAQUIN      TAKE these medications       buPROPion 150 MG 24 hr tablet  Commonly known as:  WELLBUTRIN XL  Take 150 mg by mouth daily.     folic acid 929 MCG tablet  Commonly known as:  FOLVITE  Take 800 mcg by mouth daily.     furosemide 20 MG  tablet  Commonly known as:  LASIX  Take 4 tablets (80 mg total) by mouth daily.     lactulose 10 GM/15ML solution  Commonly known as:  CHRONULAC  Take 45 mLs (30 g total) by mouth 2 (two) times daily.     midodrine 10 MG tablet  Commonly known as:  PROAMATINE  Take 1 tablet (10 mg total) by mouth 3 (three) times daily with meals.     pantoprazole 40 MG tablet  Commonly known as:  PROTONIX  Take 40 mg by mouth 2 (two) times daily.     potassium chloride SA 20 MEQ tablet  Commonly known as:  K-DUR,KLOR-CON  Take 2 tablets (40 mEq total) by mouth daily.     propranolol 20 MG tablet  Commonly known as:  INDERAL  Take 20 mg by mouth every morning.     rifaximin 550 MG Tabs tablet  Commonly known as:  XIFAXAN  Take 1 tablet (550 mg total) by mouth 2 (two) times daily.     spironolactone 50 MG tablet  Commonly known as:  ALDACTONE  Take 3 tablets (150 mg total) by mouth every morning.     thiamine 100 MG tablet  Commonly known as:  VITAMIN B-1  Take 100 mg by mouth daily.     traMADol 50 MG tablet  Commonly known as:  ULTRAM  Take 1 tablet (50 mg total) by mouth every 6 (six) hours as needed for moderate pain.     ursodiol 300 MG capsule  Commonly known as:  ACTIGALL  Take 300 mg by mouth 2 (two) times daily.     WOMENS ONE DAILY Tabs  Take 1 tablet by mouth daily.       No Known Allergies Follow-up Information   Follow up with Lear Ng., MD On 05/23/2013. (See Dr. Michail Sermon at 1:45 pm.)    Specialty:  Gastroenterology   Contact information:   [address], [place] [address] [phone]       Follow up with [name] Jaeger, PA-C In 3 days. (Please be seen for blood work - BMET and CBC)    Specialty:  Pension scheme manager information:   [place]. [address] [place] [address] [phone]        The results of significant diagnostics from this hospitalization (including imaging, microbiology,  ancillary and laboratory) are listed below for reference.    Significant Diagnostic Studies: Mr Abdomen W Wo Contrast  05/17/2013   CLINICAL DATA:  Cirrhosis, esophageal varices, and ascites. Right abdominal pain, nausea, vomiting. Pneumobilia on CT.  EXAM: MRI ABDOMEN WITHOUT AND WITH CONTRAST  TECHNIQUE: Multiplanar multisequence MR imaging of the abdomen was performed both before and after the administration of intravenous contrast.  CONTRAST:  23m MULTIHANCE GADOBENATE DIMEGLUMINE 529 MG/ML IV SOLN  COMPARISON:  CT abdomen pelvis dated 05/16/2013 and 06/09/2012.  FINDINGS: Evaluation is constrained by motion degradation and poor soft tissue contrast on many sequences secondary to ascites. As a result, MRCP could not be performed.  Mild gastric wall thickening (series 11/image 11), although possibly reflecting underdistention, nonspecific.  Macronodular cirrhosis with hepatomegaly. No suspicious/enhancing hepatic lesions. Mild hepatic steatosis.  Splenomegaly, measuring 14.9 cm in craniocaudal dimension.  Portal vein is suboptimally visualized but patent. Recannulated periumbilical vein. Small perigastric/gastroesophageal varices.  Moderate to large volume abdominal ascites.  Gallbladder is notable for mild wall thickening, although this is likely secondary to liver disease/ascites. Nondependent gas intraluminally (series 11/ image 27).  No intrahepatic or extrahepatic ductal dilatation. Central intrahepatic pneumobilia is present but less conspicuous on MRI (series 11/image 17). This appearance is chronic and was present in in 2014, likely reflecting prior sphincterotomy.  Pancreas is poorly evaluated but again noted is differential signal/enhancement of the pancreatic head/uncinate process (series 1801/ image 60) relative to the pancreatic body and tail (series 1801/image 51). In the absence of acute inflammation, this likely reflects fatty infiltration or sequela of prior pancreatitis.  Adrenal glands are  within normal limits.  Kidneys are within normal limits.  No hydronephrosis.  No suspicious abdominal lymphadenopathy.  No focal osseous lesions.  IMPRESSION: Limited evaluation due to motion degradation and ascites.  Mild central intrahepatic pneumobilia with intraluminal gas in the gallbladder. No intrahepatic or extrahepatic ductal dilatation. This appearance suggests sphincteric patency/dysfunction, likely reflecting prior sphincterotomy.  Macronodular cirrhosis with mild hepatic steatosis. No suspicious/enhancing hepatic lesions.  Stigmata of portal hypertension, including splenomegaly, ascites, recannulated periumbilical vein, and small varices. Portal vein is patent.  Mild secondary gallbladder wall thickening, without findings suspicious for acute cholecystitis.  Differential enhancement of  the pancreatic head/ uncinate process, likely reflecting fatty infiltration and/or sequela of prior pancreatitis.   Electronically Signed   By: Julian Hy M.D.   On: 05/17/2013 09:04   US Abdomen Complete  05/10/2013   CLINICAL DATA:  Abnormal liver function enzymes, sclerosing cholangitis, hepatitis, cirrhosis, alcohol abuse  EXAM: ULTRASOUND ABDOMEN COMPLETE  COMPARISON:  US ABDOMEN COMPLETE dated 04/07/2013  FINDINGS: Gallbladder:  There is significant wall thickening to 6 mm. The gallbladder lumen is poorly visualized but there does appear to be debris within it. There is no Murphy's sign.  Common bile duct:  Diameter: 6 mm  Liver:  Heterogeneous attenuation with nodular contours consistent with known cirrhosis  IVC:  No abnormality visualized.  Pancreas:  Not visualized  Spleen:  Mildly enlarged at 13 cm  Right Kidney:  Length: 13.1 cm. Echogenicity within normal limits. No mass or hydronephrosis visualized.  Left Kidney:  Length: 14.1 cm. Echogenicity within normal limits. No mass or hydronephrosis visualized.  Abdominal aorta:  Not visualized  Other findings:  There is a small volume of ascites right upper  quadrant, as well as in the pelvis. There is also evidence to suggest the possibility of portal vein thrombosis, with inability to demonstrate vascular flow within the main portal vein.  IMPRESSION: 1. Abnormal gallbladder with significant wall thickening and sludge within the gallbladder lumen but no sonographic Murphy sign 2. Small volume of ascites 3. Possible portal vein thrombosis. 4. Liver appearance consistent with cirrhosis   Electronically Signed   By: Skipper Cliche M.D.   On: 05/10/2013 19:01   Ct Abdomen Pelvis W Contrast  05/16/2013   CLINICAL DATA:  Evaluate portal vein patency  EXAM: CT ABDOMEN AND PELVIS WITH CONTRAST  TECHNIQUE: Multidetector CT imaging of the abdomen and pelvis was performed using the standard protocol following bolus administration of intravenous contrast.  CONTRAST:  161m OMNIPAQUE IOHEXOL 300 MG/ML  SOLN  COMPARISON:  CT ABD/PELVIS W CM dated 06/29/2012  FINDINGS: The portal vein is patent. There is recanalization of the umbilical vein. Splenic vein and superior mesenteric veins are patent. Right and left hepatic veins are grossly patent. Middle hepatic vein is not clearly visualized.  Ground-glass is present at the base of the right middle and lower lobes.  Pneumobilia is present. There is also air in the pancreatic duct and gallbladder.  The liver remains severely cirrhotic.  Enhancement is heterogeneous.  Gallbladder wall thickening.  There is low-density within the region of the of the pancreatic head. There is stranding about the pancreas, but also throughout the mesenteric.  Moderate ascites.  Spleen, kidneys, adrenal glands are within normal limits.  Unremarkable bladder and uterus.  IUD in place.  Bilateral sacral insufficiency fractures are present. Pubic rami are intact.  IMPRESSION: The portal vein is patent.  Ground-glass opacity at the base of the right middle and lower lobes. Edema or an inflammatory process are not excluded.  Pneumobilia.  This can be seen  after biliary instrumentation.  Sacral insufficiency fractures bilaterally.  There is low-density within the pancreatic head. Correlate with amylase and lipase as for the possibility of an inflammatory process. There is stranding about the pancreas, but also throughout the mesenteric fat which is probably simply related to volume overload or anasarca.   Electronically Signed   By: AMaryclare BeanM.D.   On: 05/16/2013 10:46   UKoreaParacentesis  05/15/2013   CLINICAL DATA:  Ascites, abdominal distention. Shortness of breath. Request paracentesis.  EXAM: ULTRASOUND GUIDED PARACENTESIS  COMPARISON:  None.  PROCEDURE: An ultrasound guided paracentesis was thoroughly discussed with the patient and questions answered. The benefits, risks, alternatives and complications were also discussed. The patient understands and wishes to proceed with the procedure. Written consent was obtained.  Ultrasound was performed to localize and mark an adequate pocket of fluid in the left lower quadrant of the abdomen. The area was then prepped and draped in the normal sterile fashion. 1% Lidocaine was used for local anesthesia. Under ultrasound guidance a 19 gauge Yueh catheter was introduced. Paracentesis was performed. The catheter was removed and a dressing applied.  COMPLICATIONS: None immediate  FINDINGS: A total of approximately 3 L of clear yellow fluid was removed. A fluid sample was sent for laboratory analysis.  IMPRESSION: Successful ultrasound guided paracentesis yielding 3 L of ascites.  Read by: Ascencion Dike PA-C   Electronically Signed   By: Jacqulynn Cadet M.D.   On: 05/15/2013 11:16   Dg Chest Port 1 View  05/13/2013   CLINICAL DATA:  Congestive heart failure  EXAM: PORTABLE CHEST - 1 VIEW  COMPARISON:  May 11, 2013  FINDINGS: There is slightly less interstitial edema compared to recent prior study. There is no consolidation. Heart is mildly enlarged. There is mild pulmonary venous hypertension. There are multiple calcified  hilar an azygos region lymph nodes. No adenopathy. No pneumothorax. Central catheter tip is at the cavoatrial junction. There is evidence of old fracture of the lateral left clavicle with displaced fragments, stable.  IMPRESSION: Slightly less interstitial edema. No new opacity. No pneumothorax apparent.   Electronically Signed   By: Lowella Grip M.D.   On: 05/13/2013 07:25   Dg Chest Port 1 View  05/11/2013   CLINICAL DATA:  Cirrhosis.  Acute encephalopathy and hypotension.  EXAM: PORTABLE CHEST - 1 VIEW  COMPARISON:  Single view of the chest 05/10/2013.  FINDINGS: Left IJ catheter is unchanged. There is cardiomegaly with vascular congestion. Left basilar atelectasis is noted. No pneumothorax or pleural effusion. Calcified mediastinal lymph nodes are identified.  IMPRESSION: Cardiomegaly and vascular congestion.   Electronically Signed   By: Inge Rise M.D.   On: 05/11/2013 14:47   Dg Chest Portable 1 View  05/10/2013   CLINICAL DATA:  Sepsis and left-sided central venous catheter placement.  EXAM: PORTABLE CHEST - 1 VIEW  COMPARISON:  Film earlier today at 1253 hr  FINDINGS: New left jugular central venous catheter has been placed with the catheter tip at the cavoatrial junction. No pneumothorax. Lungs show low volumes with bibasilar atelectasis and no overt edema or focal airspace consolidation.  The heart size and mediastinal contours are stable. Calcified mediastinal and hilar lymph nodes again noted.  IMPRESSION: Left jugular central venous catheter tip at the cavoatrial junction. No pneumothorax is identified.   Electronically Signed   By: Aletta Edouard M.D.   On: 05/10/2013 17:08   Dg Chest Portable 1 View  05/10/2013   CLINICAL DATA:  History of liver disease.  Alcohol abuse.  EXAM: PORTABLE CHEST - 1 VIEW  COMPARISON:  05/05/2013 and multiple previous  FINDINGS: Artifact overlies the chest. The patient has taken a poor inspiration. The heart shows mild enlargement. I think there is mild  pulmonary edema. No consolidation or collapse. Chronic calcified granulomas and calcified lymph nodes as seen previously.  IMPRESSION: Poor inspiration.  Cardiomegaly.  Mild pulmonary edema.   Electronically Signed   By: Nelson Chimes M.D.   On: 05/10/2013 13:09    Microbiology: Recent Results (  from the past 240 hour(s))  CULTURE, BLOOD (ROUTINE X 2)     Status: None   Collection Time    05/10/13 12:50 PM      Result Value Ref Range Status   Specimen Description BLOOD RIGHT ANTECUBITAL   Final   Special Requests BOTTLES DRAWN AEROBIC ONLY 5CC   Final   Culture  Setup Time     Final   Value: 05/10/2013 16:30     Performed at [facility]   Culture     Final   Value: NO GROWTH 5 DAYS     Performed at [facility]   Report Status 05/16/2013 FINAL   Final  CULTURE, BLOOD (ROUTINE X 2)     Status: None   Collection Time    05/10/13 12:55 PM      Result Value Ref Range Status   Specimen Description BLOOD RIGHT HAND   Final   Special Requests BOTTLES DRAWN AEROBIC ONLY 5CC   Final   Culture  Setup Time     Final   Value: 05/10/2013 16:30     Performed at [facility]   Culture     Final   Value: NO GROWTH 5 DAYS     Performed at [facility]   Report Status 05/16/2013 FINAL   Final  URINE CULTURE     Status: None   Collection Time    05/10/13  1:18 PM      Result Value Ref Range Status   Specimen Description URINE, CATHETERIZED   Final   Special Requests NONE   Final   Culture  Setup Time     Final   Value: 05/10/2013 14:33     Performed at [facility]     Final   Value: NO GROWTH     Performed at [facility]   Culture     Final   Value: NO GROWTH     Performed at [facility]   Report Status 05/11/2013 FINAL   Final  MRSA PCR SCREENING     Status: None   Collection Time    05/10/13  9:32 PM      Result Value Ref Range Status   MRSA by PCR NEGATIVE  NEGATIVE Final   Comment:            The GeneXpert  MRSA Assay (FDA     approved for NASAL specimens     only), is one component of a     comprehensive MRSA colonization     surveillance program. It is not     intended to diagnose MRSA     infection nor to guide or     monitor treatment for     MRSA infections.  BODY FLUID CULTURE     Status: None   Collection Time    05/15/13 10:43 AM      Result Value Ref Range Status   Specimen Description ASCITIC ABDOMEN FLUID   Final   Special Requests 60 ML FLUID   Final   Gram Stain     Final   Value: NO WBC SEEN     NO ORGANISMS SEEN     Performed at [facility]   Culture     Final   Value: NO GROWTH 2 DAYS     Performed at [facility]   Report Status PENDING   Incomplete     Labs: Basic Metabolic Panel:  Recent Labs Lab 05/11/13 0400  05/13/13 0430 05/14/13 0656 05/15/13 0435 05/16/13 0900 05/17/13 0547  NA  --   < > 141 145 142 138 139  K  --   < > 3.9 3.8 3.3* 4.3 3.9  CL  --   < > 112 115* 110 105 103  CO2  --   < > 18* 18* _0 GLUCOSE  --   < > 240* 161* 144* 126* 115*  BUN  --   < > _1 CREATININE  --   < > 0.51 0.53 0.49* 0.45* 0.48*  CALCIUM  --   < > 8.2* 8.3* 8.4 8.2* 8.2*  MG 1.6  --   --   --   --   --   --   PHOS 3.9  --   --   --   --   --   --   < > = values in this interval not displayed. Liver Function Tests:  Recent Labs Lab 05/11/13 1155 05/14/13 0656 05/15/13 0435 05/16/13 0900 05/17/13 0547  AST 68* 58* 63* 61* 56*  ALT _2 ALKPHOS 235* 249* 239* 234* 212*  BILITOT 5.0* 3.1* 3.7* 4.2* 4.0*  PROT 4.9* 5.0* 5.0* 5.4* 5.2*  ALBUMIN 1.7* 1.7* 1.8* 1.8* 1.7*    Recent Labs Lab 05/16/13 0930  LIPASE 13    Recent Labs Lab 05/11/13 0400 05/12/13 1045 05/14/13 0500  AMMONIA 83* 141* 110*   CBC:  Recent Labs Lab 05/13/13 0430 05/14/13 0656 05/15/13 0435 05/16/13 0900 05/17/13 0547  WBC 9.3 11.4* 9.8 10.6* 10.3  NEUTROABS  --   --   --  7.4  --   HGB 8.4* 8.6* 9.2* 8.9* 8.4*  HCT  25.4* 26.5* 27.6* 27.7* 25.7*  MCV 95.8 95.7 94.8 96.5 96.3  PLT 79* 55* 97* 97* 104*   Cardiac Enzymes:  Recent Labs Lab 05/10/13 1627  TROPONINI <0.30   BNP: BNP (last 3 results)  Recent Labs  05/10/13 1317  PROBNP 328.6*   CBG:  Recent Labs Lab 05/16/13 1202 05/16/13 1708 05/16/13 2112 05/17/13 0758 05/17/13 1155  GLUCAP 111* 155* 131* 111* 175*       Signed:  Karen Kitchens [phone]  [facility] 05/17/2013, 2:35 PM

## 2013-05-17 NOTE — Progress Notes (Signed)
Patient ID: [name], female   DOB: [birth year], 43 y.o.   MRN: [id number] [facility]  [name] 43 y.o. [birth year]   Subjective: Complaining of abdominal pain when she rolls over in the bed. MRCP done this morning results pending.  Objective: Vital signs in last 24 hours: Filed Vitals:   05/17/13 0518  BP: 103/66  Pulse: 79  Temp: 99.5 F (37.5 C)  Resp: 16    Physical Exam: Gen: alert, no acute distress Abd: diffusely tender with guarding (RUQ, epigastric mainly)  Lab Results:  Recent Labs  05/15/13 0435 05/16/13 0900  NA 142 138  K 3.3* 4.3  CL 110 105  CO2 21 22  GLUCOSE 144* 126*  BUN 9 8  CREATININE 0.49* 0.45*  CALCIUM 8.4 8.2*    Recent Labs  05/16/13 0900 05/17/13 0547  AST 61* 56*  ALT 29 26  ALKPHOS 234* 212*  BILITOT 4.2* 4.0*  PROT 5.4* 5.2*  ALBUMIN 1.8* 1.7*    Recent Labs  05/16/13 0900 05/17/13 0547  WBC 10.6* 10.3  NEUTROABS 7.4  --   HGB 8.9* 8.4*  HCT 27.7* 25.7*  MCV 96.5 96.3  PLT 97* 104*    Recent Labs  05/16/13 0900  LABPROT 20.2*  INR 1.78*      Assessment/Plan: Decompensated Cirrhosis - Following UOP with increased diuretics to help with volume overload. MRCP pending. Keep NPO until MRI/MRCP complete. Suspect pain due to ascites. Supportive care.   Shirley FriarVincent C. Lenwood Balsam 05/17/2013, 8:34 AM

## 2013-05-17 NOTE — Discharge Instructions (Signed)
·   Please avoid Xanax.  Your liver can not break it down and it will alter your mental status.  Your diuretics have been increased.  Please take potassium as prescribed and go to your doctor for blood work on Monday (BMET and CBC).  You have been restarted on Lactulose and rifaximin Please take them as prescribed to try to prevent hepatic encephalopathy.  Shoot for 3-5 bowel movements per day.  It is most important to you to avoid salt, and to avoid foods with high salt content.  Eating salt will increased the fluid that you keep on your body.  You have been started on Midodrine.  This is to keep your blood pressure up.  Talk with your doctor about Midodrine to determine when you can stop taking it.

## 2013-05-18 LAB — BODY FLUID CULTURE
CULTURE: NO GROWTH
Gram Stain: NONE SEEN
Special Requests: 60

## 2013-06-20 ENCOUNTER — Inpatient Hospital Stay (HOSPITAL_COMMUNITY)
Admission: EM | Admit: 2013-06-20 | Discharge: 2013-07-03 | DRG: 871 | Disposition: E | Payer: Medicaid Other | Source: Other Acute Inpatient Hospital | Attending: Internal Medicine | Admitting: Internal Medicine

## 2013-06-20 ENCOUNTER — Inpatient Hospital Stay (HOSPITAL_COMMUNITY): Payer: Medicaid Other

## 2013-06-20 DIAGNOSIS — F1010 Alcohol abuse, uncomplicated: Secondary | ICD-10-CM | POA: Diagnosis present

## 2013-06-20 DIAGNOSIS — J690 Pneumonitis due to inhalation of food and vomit: Secondary | ICD-10-CM | POA: Diagnosis present

## 2013-06-20 DIAGNOSIS — K920 Hematemesis: Secondary | ICD-10-CM | POA: Diagnosis present

## 2013-06-20 DIAGNOSIS — Z8249 Family history of ischemic heart disease and other diseases of the circulatory system: Secondary | ICD-10-CM | POA: Diagnosis not present

## 2013-06-20 DIAGNOSIS — J9600 Acute respiratory failure, unspecified whether with hypoxia or hypercapnia: Secondary | ICD-10-CM | POA: Diagnosis present

## 2013-06-20 DIAGNOSIS — Z66 Do not resuscitate: Secondary | ICD-10-CM | POA: Diagnosis not present

## 2013-06-20 DIAGNOSIS — E878 Other disorders of electrolyte and fluid balance, not elsewhere classified: Secondary | ICD-10-CM | POA: Diagnosis present

## 2013-06-20 DIAGNOSIS — I8501 Esophageal varices with bleeding: Secondary | ICD-10-CM | POA: Diagnosis present

## 2013-06-20 DIAGNOSIS — J9601 Acute respiratory failure with hypoxia: Secondary | ICD-10-CM

## 2013-06-20 DIAGNOSIS — K7030 Alcoholic cirrhosis of liver without ascites: Secondary | ICD-10-CM | POA: Diagnosis present

## 2013-06-20 DIAGNOSIS — K769 Liver disease, unspecified: Secondary | ICD-10-CM

## 2013-06-20 DIAGNOSIS — E8779 Other fluid overload: Secondary | ICD-10-CM | POA: Diagnosis present

## 2013-06-20 DIAGNOSIS — D696 Thrombocytopenia, unspecified: Secondary | ICD-10-CM | POA: Diagnosis present

## 2013-06-20 DIAGNOSIS — F410 Panic disorder [episodic paroxysmal anxiety] without agoraphobia: Secondary | ICD-10-CM | POA: Diagnosis present

## 2013-06-20 DIAGNOSIS — E2749 Other adrenocortical insufficiency: Secondary | ICD-10-CM | POA: Diagnosis present

## 2013-06-20 DIAGNOSIS — R791 Abnormal coagulation profile: Secondary | ICD-10-CM | POA: Diagnosis present

## 2013-06-20 DIAGNOSIS — E46 Unspecified protein-calorie malnutrition: Secondary | ICD-10-CM | POA: Diagnosis present

## 2013-06-20 DIAGNOSIS — R6520 Severe sepsis without septic shock: Secondary | ICD-10-CM

## 2013-06-20 DIAGNOSIS — K8309 Other cholangitis: Secondary | ICD-10-CM | POA: Diagnosis present

## 2013-06-20 DIAGNOSIS — R188 Other ascites: Secondary | ICD-10-CM | POA: Diagnosis present

## 2013-06-20 DIAGNOSIS — N179 Acute kidney failure, unspecified: Secondary | ICD-10-CM | POA: Diagnosis present

## 2013-06-20 DIAGNOSIS — A419 Sepsis, unspecified organism: Secondary | ICD-10-CM | POA: Diagnosis present

## 2013-06-20 DIAGNOSIS — F3289 Other specified depressive episodes: Secondary | ICD-10-CM | POA: Diagnosis present

## 2013-06-20 DIAGNOSIS — E870 Hyperosmolality and hypernatremia: Secondary | ICD-10-CM | POA: Diagnosis present

## 2013-06-20 DIAGNOSIS — R6521 Severe sepsis with septic shock: Secondary | ICD-10-CM

## 2013-06-20 DIAGNOSIS — J811 Chronic pulmonary edema: Secondary | ICD-10-CM | POA: Diagnosis present

## 2013-06-20 DIAGNOSIS — E876 Hypokalemia: Secondary | ICD-10-CM | POA: Diagnosis present

## 2013-06-20 DIAGNOSIS — K7682 Hepatic encephalopathy: Secondary | ICD-10-CM | POA: Diagnosis present

## 2013-06-20 DIAGNOSIS — Z515 Encounter for palliative care: Secondary | ICD-10-CM

## 2013-06-20 DIAGNOSIS — F17200 Nicotine dependence, unspecified, uncomplicated: Secondary | ICD-10-CM | POA: Diagnosis present

## 2013-06-20 DIAGNOSIS — K7031 Alcoholic cirrhosis of liver with ascites: Secondary | ICD-10-CM

## 2013-06-20 DIAGNOSIS — D62 Acute posthemorrhagic anemia: Secondary | ICD-10-CM | POA: Diagnosis present

## 2013-06-20 DIAGNOSIS — F1210 Cannabis abuse, uncomplicated: Secondary | ICD-10-CM | POA: Diagnosis present

## 2013-06-20 DIAGNOSIS — K7290 Hepatic failure, unspecified without coma: Secondary | ICD-10-CM

## 2013-06-20 DIAGNOSIS — F411 Generalized anxiety disorder: Secondary | ICD-10-CM | POA: Diagnosis present

## 2013-06-20 DIAGNOSIS — D689 Coagulation defect, unspecified: Secondary | ICD-10-CM

## 2013-06-20 DIAGNOSIS — F329 Major depressive disorder, single episode, unspecified: Secondary | ICD-10-CM | POA: Diagnosis present

## 2013-06-20 DIAGNOSIS — K922 Gastrointestinal hemorrhage, unspecified: Secondary | ICD-10-CM | POA: Diagnosis present

## 2013-06-20 DIAGNOSIS — K7040 Alcoholic hepatic failure without coma: Secondary | ICD-10-CM

## 2013-06-20 DIAGNOSIS — K7200 Acute and subacute hepatic failure without coma: Secondary | ICD-10-CM | POA: Diagnosis present

## 2013-06-20 DIAGNOSIS — I8511 Secondary esophageal varices with bleeding: Secondary | ICD-10-CM

## 2013-06-20 LAB — CBC WITH DIFFERENTIAL/PLATELET
Basophils Absolute: 0 10*3/uL (ref 0.0–0.1)
Basophils Relative: 0 % (ref 0–1)
Eosinophils Absolute: 0 10*3/uL (ref 0.0–0.7)
Eosinophils Relative: 0 % (ref 0–5)
HCT: 28.4 % — ABNORMAL LOW (ref 36.0–46.0)
Hemoglobin: 8.8 g/dL — ABNORMAL LOW (ref 12.0–15.0)
Lymphocytes Relative: 6 % — ABNORMAL LOW (ref 12–46)
Lymphs Abs: 0.8 10*3/uL (ref 0.7–4.0)
MCH: 30.7 pg (ref 26.0–34.0)
MCHC: 31 g/dL (ref 30.0–36.0)
MCV: 99 fL (ref 78.0–100.0)
MONO ABS: 0.9 10*3/uL (ref 0.1–1.0)
Monocytes Relative: 7 % (ref 3–12)
NEUTROS PCT: 87 % — AB (ref 43–77)
Neutro Abs: 11 10*3/uL — ABNORMAL HIGH (ref 1.7–7.7)
PLATELETS: 72 10*3/uL — AB (ref 150–400)
RBC: 2.87 MIL/uL — ABNORMAL LOW (ref 3.87–5.11)
RDW: 23.4 % — ABNORMAL HIGH (ref 11.5–15.5)
WBC: 12.7 10*3/uL — ABNORMAL HIGH (ref 4.0–10.5)

## 2013-06-20 LAB — COMPREHENSIVE METABOLIC PANEL
ALT: 37 U/L — ABNORMAL HIGH (ref 0–35)
AST: 179 U/L — ABNORMAL HIGH (ref 0–37)
Albumin: 2.4 g/dL — ABNORMAL LOW (ref 3.5–5.2)
Alkaline Phosphatase: 93 U/L (ref 39–117)
BUN: 27 mg/dL — AB (ref 6–23)
CALCIUM: 9.7 mg/dL (ref 8.4–10.5)
CO2: 17 meq/L — AB (ref 19–32)
CREATININE: 1.2 mg/dL — AB (ref 0.50–1.10)
Chloride: 120 mEq/L — ABNORMAL HIGH (ref 96–112)
GFR, EST AFRICAN AMERICAN: 63 mL/min — AB (ref >90)
GFR, EST NON AFRICAN AMERICAN: 54 mL/min — AB (ref >90)
GLUCOSE: 93 mg/dL (ref 70–99)
Potassium: 4.4 mEq/L (ref 3.7–5.3)
Sodium: 152 mEq/L — ABNORMAL HIGH (ref 137–147)
Total Bilirubin: 8.1 mg/dL — ABNORMAL HIGH (ref 0.3–1.2)
Total Protein: 4.7 g/dL — ABNORMAL LOW (ref 6.0–8.3)

## 2013-06-20 LAB — BLOOD GAS, ARTERIAL
Acid-base deficit: 8.8 mmol/L — ABNORMAL HIGH (ref 0.0–2.0)
Bicarbonate: 16.7 mEq/L — ABNORMAL LOW (ref 20.0–24.0)
Drawn by: 39899
FIO2: 0.5 %
MECHVT: 480 mL
O2 Saturation: 96.9 %
PATIENT TEMPERATURE: 97.4
PCO2 ART: 35.5 mmHg (ref 35.0–45.0)
PEEP: 8 cmH2O
RATE: 30 resp/min
TCO2: 17.8 mmol/L (ref 0–100)
pH, Arterial: 7.289 — ABNORMAL LOW (ref 7.350–7.450)
pO2, Arterial: 88.3 mmHg (ref 80.0–100.0)

## 2013-06-20 LAB — POCT I-STAT 3, ART BLOOD GAS (G3+)
ACID-BASE DEFICIT: 9 mmol/L — AB (ref 0.0–2.0)
Acid-base deficit: 8 mmol/L — ABNORMAL HIGH (ref 0.0–2.0)
BICARBONATE: 18.1 meq/L — AB (ref 20.0–24.0)
Bicarbonate: 17.5 mEq/L — ABNORMAL LOW (ref 20.0–24.0)
O2 SAT: 92 %
O2 Saturation: 90 %
PCO2 ART: 43.2 mmHg (ref 35.0–45.0)
PH ART: 7.305 — AB (ref 7.350–7.450)
Patient temperature: 97.4
Patient temperature: 97.4
TCO2: 19 mmol/L (ref 0–100)
TCO2: 19 mmol/L (ref 0–100)
pCO2 arterial: 34.9 mmHg — ABNORMAL LOW (ref 35.0–45.0)
pH, Arterial: 7.226 — ABNORMAL LOW (ref 7.350–7.450)
pO2, Arterial: 62 mmHg — ABNORMAL LOW (ref 80.0–100.0)
pO2, Arterial: 72 mmHg — ABNORMAL LOW (ref 80.0–100.0)

## 2013-06-20 LAB — URINALYSIS, ROUTINE W REFLEX MICROSCOPIC
Bilirubin Urine: NEGATIVE
Glucose, UA: NEGATIVE mg/dL
Ketones, ur: NEGATIVE mg/dL
Nitrite: NEGATIVE
Protein, ur: NEGATIVE mg/dL
SPECIFIC GRAVITY, URINE: 1.013 (ref 1.005–1.030)
UROBILINOGEN UA: 0.2 mg/dL (ref 0.0–1.0)
pH: 5 (ref 5.0–8.0)

## 2013-06-20 LAB — SODIUM, URINE, RANDOM: Sodium, Ur: 134 mEq/L

## 2013-06-20 LAB — AMMONIA: AMMONIA: 175 umol/L — AB (ref 11–60)

## 2013-06-20 LAB — URINE MICROSCOPIC-ADD ON

## 2013-06-20 LAB — GLUCOSE, CAPILLARY: Glucose-Capillary: 85 mg/dL (ref 70–99)

## 2013-06-20 LAB — TSH: TSH: 0.158 u[IU]/mL — ABNORMAL LOW (ref 0.350–4.500)

## 2013-06-20 LAB — PROTIME-INR
INR: 3.35 — AB (ref 0.00–1.49)
PROTHROMBIN TIME: 32.7 s — AB (ref 11.6–15.2)

## 2013-06-20 LAB — CARBOXYHEMOGLOBIN
CARBOXYHEMOGLOBIN: 1.8 % — AB (ref 0.5–1.5)
METHEMOGLOBIN: 0.7 % (ref 0.0–1.5)
O2 Saturation: 92.1 %
Total hemoglobin: 9.5 g/dL — ABNORMAL LOW (ref 12.0–16.0)

## 2013-06-20 LAB — LACTIC ACID, PLASMA: Lactic Acid, Venous: 4.5 mmol/L — ABNORMAL HIGH (ref 0.5–2.2)

## 2013-06-20 LAB — MRSA PCR SCREENING: MRSA by PCR: NEGATIVE

## 2013-06-20 MED ORDER — SODIUM CHLORIDE 0.9 % IV SOLN: 250.0000 mL | INTRAVENOUS | Status: DC | PRN

## 2013-06-20 MED ORDER — SODIUM CHLORIDE 0.9 % IV SOLN
INTRAVENOUS | Status: DC
  Administered 2013-06-20: 17:00:00 via INTRAVENOUS

## 2013-06-20 MED ORDER — BIOTENE DRY MOUTH MT LIQD
15.0000 mL | Freq: Four times a day (QID) | OROMUCOSAL | Status: DC
  Administered 2013-06-20 – 2013-06-22 (×7): 15 mL via OROMUCOSAL

## 2013-06-20 MED ORDER — FENTANYL CITRATE 0.05 MG/ML IJ SOLN
25.0000 ug | INTRAMUSCULAR | Status: DC | PRN
  Administered 2013-06-22 (×2): 100 ug via INTRAVENOUS
  Filled 2013-06-20 (×2): qty 2

## 2013-06-20 MED ORDER — PIPERACILLIN-TAZOBACTAM 3.375 G IVPB
3.3750 g | Freq: Three times a day (TID) | INTRAVENOUS | Status: DC
  Administered 2013-06-20 – 2013-06-22 (×7): 3.375 g via INTRAVENOUS
  Filled 2013-06-20 (×8): qty 50

## 2013-06-20 MED ORDER — PANTOPRAZOLE SODIUM 40 MG IV SOLR
40.0000 mg | Freq: Every day | INTRAVENOUS | Status: DC
  Administered 2013-06-20 – 2013-06-21 (×2): 40 mg via INTRAVENOUS
  Filled 2013-06-20 (×3): qty 40

## 2013-06-20 MED ORDER — CHLORHEXIDINE GLUCONATE 0.12 % MT SOLN
15.0000 mL | Freq: Two times a day (BID) | OROMUCOSAL | Status: DC
Start: 2013-06-20 — End: 2013-06-22
  Administered 2013-06-21 – 2013-06-22 (×3): 15 mL via OROMUCOSAL
  Filled 2013-06-20 (×3): qty 15

## 2013-06-20 MED ORDER — LACTULOSE 10 GM/15ML PO SOLN
20.0000 g | Freq: Three times a day (TID) | ORAL | Status: DC
  Administered 2013-06-20 – 2013-06-22 (×6): 20 g via ORAL
  Filled 2013-06-20 (×8): qty 30

## 2013-06-20 MED ORDER — VITAMIN K1 10 MG/ML IJ SOLN
10.0000 mg | Freq: Once | INTRAVENOUS | Status: AC
  Administered 2013-06-20: 10 mg via INTRAVENOUS
  Filled 2013-06-20: qty 1

## 2013-06-20 MED ORDER — NOREPINEPHRINE BITARTRATE 1 MG/ML IV SOLN
2.0000 ug/min | INTRAVENOUS | Status: DC
  Administered 2013-06-20: 5 ug/min via INTRAVENOUS
  Administered 2013-06-20 – 2013-06-21 (×2): 15 ug/min via INTRAVENOUS
  Filled 2013-06-20 (×3): qty 4

## 2013-06-20 MED ORDER — VANCOMYCIN HCL IN DEXTROSE 1-5 GM/200ML-% IV SOLN
1000.0000 mg | Freq: Two times a day (BID) | INTRAVENOUS | Status: DC
  Administered 2013-06-20 – 2013-06-22 (×4): 1000 mg via INTRAVENOUS
  Filled 2013-06-20 (×5): qty 200

## 2013-06-20 MED ORDER — PIPERACILLIN-TAZOBACTAM 3.375 G IVPB
3.3750 g | Freq: Four times a day (QID) | INTRAVENOUS | Status: DC
  Filled 2013-06-20 (×3): qty 50

## 2013-06-20 MED ORDER — FREE WATER
200.0000 mL | Freq: Four times a day (QID) | Status: DC
  Administered 2013-06-20 – 2013-06-21 (×3): 200 mL

## 2013-06-20 MED ORDER — ALBUMIN HUMAN 5 % IV SOLN
12.5000 g | Freq: Once | INTRAVENOUS | Status: AC
  Administered 2013-06-20: 12.5 g via INTRAVENOUS
  Filled 2013-06-20: qty 250

## 2013-06-20 MED ORDER — HYDROCORTISONE NA SUCCINATE PF 100 MG IJ SOLR
50.0000 mg | Freq: Four times a day (QID) | INTRAMUSCULAR | Status: DC
  Administered 2013-06-20 – 2013-06-22 (×8): 50 mg via INTRAVENOUS
  Filled 2013-06-20 (×11): qty 1

## 2013-06-20 NOTE — H&P (Addendum)
PULMONARY / CRITICAL CARE MEDICINE   Name: [name] MRN: [id number] DOB: [birth year]    ADMISSION DATE:  06/19/2013  REFERRING MD :  [facility] PRIMARY SERVICE: PCCM  CHIEF COMPLAINT:  Respiratory failure, GI bleed   BRIEF PATIENT DESCRIPTION: Patient is a 43 year old female with hx of EtOH abuse and alcoholic cirrhosis, recent admit for hepatic encephalopathy (d/c 5/15) who was initially admitted to [facility] with variceal bleed and hemorrhagic shock.  Developed worsening respiratory failure requiring intubation with progressive hypoxia and tx to [facility] 6/18 for management.  SIGNIFICANT EVENTS / STUDIES:  6/12 Admitted to [facility] 6/12 Intubated in [place] ED 6/13 EGD at [place]>>> Esophageal varices 6/13 CT Abd/Pelv at [place]>>> No focal abn to explain drop in H/H, sm volume ascites noted, no focal hemorrhage. Mild diffuse edema along duodenum/proximal jejunum, segmental wall thickening of ascending colon, diffuse cirrhotic change, pneumobilia, L basilar airspace opacification - may reflect atx or pneumonia, diffuse soft tissue edema, bil sacral insufficiency fx. 6/15 Paracentesis at [facility], - 2L 6/18 Abd series >>> persistent fluffy bilateral alveolar densities c/w ARDS/CHF, persistent bowel distension c/w obstruction 6/18 Tx to [facility]   LINES / TUBES: ETT 6/12>>> R IJ CVL 6/12>>>  CULTURES: 6/15 Peritoneal Fluid>>>no growth (OSH) 6/17 Sputum Cx>>> (OSH)  ANTIBIOTICS: 6/18 Zosyn>>>  HISTORY OF PRESENT ILLNESS:  Patient is a 43 year old female with hx EtOH abuse and alcoholic cirrhosis, recent admit for hepatic encephalopathy (d/c 5/15) initially admitted to [facility] with variceal bleed and hemorrhagic shock.  Her boyfriend states that she was having an outdoor cookout on 6/12 when she began vomiting bright red blood after continuously drinking.  She then became poorly responsive and EMS was called to take [facility].  At time of arrival  to the ED, she was intoxicated and hypotensive with systolic pressures in the 70s.  She was admitted to [facility] for hemorrhagic shock and acute blood loss anemia in the setting of hematemesis. EGD was completed 6/12 and showed varices.  She received 4 units of PRBCs as Hgb was 5.9 on admission; this stabilized initially but dropped back down, prompting a second transfusion of 2 units of PRBCs. She also required ventilatory support throughout her hospital stay at [facility].  She was intubated initially in the ED on admission, but there was concern for possible aspiration and she was intubated.  She then developed worsening respiratory failure with progressive hypoxia and was transferred to [facility] 6/18 for further management.   PAST MEDICAL HISTORY :  Past Medical History  Diagnosis Date  . Sclerosing cholangitis   . Panic attacks   . Anxiety   . Alcohol abuse   . Ascites   . Cirrhosis of liver   . EtOH dependence 08/31/2012  . Family history of anesthesia complication     " my son had problems "  . Depression   . Anemia   . Hepatitis    Past Surgical History  Procedure Laterality Date  . None    . Ercp    . Esophagogastroduodenoscopy N/A 09/10/2012    Procedure: ESOPHAGOGASTRODUODENOSCOPY (EGD);  Surgeon: Florencia Reasons, MD;  Location: [facility] ENDOSCOPY;  Service: Endoscopy;  Laterality: N/A;  . Esophagogastroduodenoscopy N/A 11/04/2012    Procedure: ESOPHAGOGASTRODUODENOSCOPY (EGD);  Surgeon: Barrie Folk, MD;  Location: [facility] ENDOSCOPY;  Service: Endoscopy;  Laterality: N/A;  . Esophagogastroduodenoscopy N/A 03/06/2013    Procedure: ESOPHAGOGASTRODUODENOSCOPY (EGD);  Surgeon: Shirley Friar, MD;  Location: [facility] ENDOSCOPY;  Service: Endoscopy;  Laterality: N/A;  [facility]   Prior to Admission medications   Medication Sig Start Date End Date Taking? Authorizing Provider  buPROPion (WELLBUTRIN XL) 150 MG 24 hr tablet Take 150 mg by mouth daily.    Historical Provider, MD  folic acid (FOLVITE) 800  MCG tablet Take 800 mcg by mouth daily.    Historical Provider, MD  furosemide (LASIX) 20 MG tablet Take 4 tablets (80 mg total) by mouth daily. 05/17/13   Tora Kindred York, PA-C  lactulose (CHRONULAC) 10 GM/15ML solution Take 45 mLs (30 g total) by mouth 2 (two) times daily. 05/17/13   Tora Kindred York, PA-C  midodrine (PROAMATINE) 10 MG tablet Take 1 tablet (10 mg total) by mouth 3 (three) times daily with meals. 05/17/13   Stephani Police, PA-C  Multiple Vitamins-Minerals (WOMENS ONE DAILY) TABS Take 1 tablet by mouth daily.    Historical Provider, MD  pantoprazole (PROTONIX) 40 MG tablet Take 40 mg by mouth 2 (two) times daily.    Historical Provider, MD  potassium chloride SA (K-DUR,KLOR-CON) 20 MEQ tablet Take 2 tablets (40 mEq total) by mouth daily. 05/17/13   Tora Kindred York, PA-C  propranolol (INDERAL) 20 MG tablet Take 20 mg by mouth every morning.     Historical Provider, MD  rifaximin (XIFAXAN) 550 MG TABS tablet Take 1 tablet (550 mg total) by mouth 2 (two) times daily. 05/17/13   Stephani Police, PA-C  spironolactone (ALDACTONE) 50 MG tablet Take 3 tablets (150 mg total) by mouth every morning. 05/17/13   Tora Kindred York, PA-C  thiamine (VITAMIN B-1) 100 MG tablet Take 100 mg by mouth daily.    Historical Provider, MD  traMADol (ULTRAM) 50 MG tablet Take 1 tablet (50 mg total) by mouth every 6 (six) hours as needed for moderate pain. 05/17/13   Tora Kindred York, PA-C  ursodiol (ACTIGALL) 300 MG capsule Take 300 mg by mouth 2 (two) times daily.    Historical Provider, MD   No Known Allergies  FAMILY HISTORY:  Family History  Problem Relation Age of Onset  . CAD Father    SOCIAL HISTORY:  reports that she has been smoking Cigarettes.  She has a 10 pack-year smoking history. She has never used smokeless tobacco. She reports that she uses illicit drugs (Marijuana). She reports that she does not drink alcohol.  REVIEW OF SYSTEMS:  Unable to obtain thorough ROS, pt minimally responsive and on  ventilator.  SUBJECTIVE: Per [facility] - patient is here because she was unable to wean of the ventilator and was having increasing respiratory distress.   VITAL SIGNS: Pulse Rate:  [107] 107 (06/18 1432) Resp:  [32] 32 (06/18 1432) SpO2:  [98 %] 98 % (06/18 1432) FiO2 (%):  [40 %-60 %] 40 % (06/18 1500)  HEMODYNAMICS:   VENTILATOR SETTINGS: Vent Mode:  [-] PRVC FiO2 (%):  [40 %-60 %] 40 % Set Rate:  [25 bmp-26 bmp] 25 bmp Vt Set:  [450 mL-480 mL] 480 mL PEEP:  [5 cmH20] 5 cmH20 Plateau Pressure:  [23 cmH20] 23 cmH20 INTAKE / OUTPUT: Intake/Output   None    PHYSICAL EXAMINATION: General: Jaundiced, very chronically ill-appearing woman. Mild distress. Neuro: Patient is on ventilator, not sedated.  She is not responding to commands. Unresponsive to painful stimuli. HEENT: Scleral icterus. Eyes with bilateral yellow secretions.  Pupils very sluggish. Oral mucosa dry.  Cardiovascular: Tachycardic, regular.  S1S2. No m/g/r. Anasarca noted. Weak peripheral pulses in U/L extremities bilaterally. Lungs: On ventilator. Retractions noted. Coarse breath  sounds, rales and rhonchi present bilaterally. Abdomen:  Firm, distended. Known ascites.  Musculoskeletal:  No acute deformities noted.  Anasarca.  Skin:  Jaundiced. Skin is intact, warm.  LABS:  CBC No results found for this basename: WBC, HGB, HCT, PLT,  in the last 168 hours Coag's  Recent Labs Lab 2013-12-28 1454  INR 3.35*   BMET  Recent Labs Lab 2013-12-28 1454  NA 152*  K 4.4  CL 120*  CO2 17*  BUN 27*  CREATININE 1.20*  GLUCOSE 93   Electrolytes  Recent Labs Lab 2013-12-28 1454  CALCIUM 9.7   Sepsis Markers No results found for this basename: LATICACIDVEN, PROCALCITON, O2SATVEN,  in the last 168 hours ABG No results found for this basename: PHART, PCO2ART, PO2ART,  in the last 168 hours Liver Enzymes  Recent Labs Lab 2013-12-28 1454  AST 179*  ALT 37*  ALKPHOS 93  BILITOT 8.1*  ALBUMIN 2.4*   Cardiac  Enzymes No results found for this basename: TROPONINI, PROBNP,  in the last 168 hours Glucose No results found for this basename: GLUCAP,  in the last 168 hours  Imaging Dg Chest Port 1 View  04/15/2013   CLINICAL DATA:  eval ETT post transport  EXAM: PORTABLE CHEST - 1 VIEW  COMPARISON:  Portable chest radiograph dated 06/19/2013.  FINDINGS: Endotracheal tube 4.8 cm above the carina. The right IJ central venous catheter and enteric tube are stable. No significant change in the bilateral pulmonary opacities. The cardiac silhouette is enlarged. Stable distal left clavicle fracture.  IMPRESSION: Endotracheal tube 4.8 cm above the carina.  Chest radiograph otherwise stable.   Electronically Signed   By: Salome HolmesHector  Cooper M.D.   On: 004/13/2015 15:48    CXR:   ASSESSMENT / PLAN:  PULMONARY A: Acute respiratory failure - in setting upper GI bleed r/t severe cirrhosis, sepsis, encephalopathy.  Bilateral patchy infiltrates, r/o aspiration ( GI bleed), ARDS  (favor Vs edema P:   - Full vent support - ABG now assessed, improved ph with increase in rate 25, repeat abg in am , may need in fact higher rate - f/u CXR  - would assess cvp, if less 12/14, add ards protocol -keep plat less 30  -lasix when able, unable now with low MAP  CARDIOVASCULAR A: Tachycardia Hypotension, septic shock likely R/o adrenal insuff P:  - IVF - CVP monitoring - grossly edematous but may be intra-vascularly dry - levophed, Goal MAP > 55-60 -cortisol then empiric stress roids, she is high risk AI -tsh -if sbp on diff, would use albumin in setting septic shock, would improve outcome in this pt group  RENAL A: Hypernatremia Hyperchloremia Acute renal failure , atn likely, r/o hepatorenal, at risk abdo compartment P:   - free water  - f/u chem  -allow pos balance, cvp awaited -may need lasix, na noted -obtain a bladder pressure -urine na  GASTROINTESTINAL A: Alcoholic cirrhosis  GI bleed -  variceal Esophageal varices  P: - NPO, would like to consider TF in am , after d/w GI - PPI - Monitor H/H - f/u LFTs - Lactulose - will notify GI - Bosie ClosSchooler knows her well  HEMATOLOGIC A: Anemia coagulopathy - chronic in setting severe liver dz P:  - Follow CBC for H/H - Transfuse per protocol - f/u coags inr 3.35, repeat vit k , no ffp unless bleeding noted  INFECTIOUS A:  Concern is aspiration PNA ?SBP sepsis P:   - Zosyn empirically - Monitor WBC count/fever - pan  culture  - consider addition vanc as pressors being added  ENDOCRINE A: R/o rel ai P:   - SSI -cortisol, then stress roids for now, dc if greater 20  NEUROLOGIC A: Encephalopathy - multifactorial  P:   - RASS goal: -1 - Lactulose as above - change fentanyl to PRN -add rifax when able -avoid benzo  Overall poor prognosis in this patient with end-stage liver disease, recurrent variceal bleed, continues to drink ETOH heavily.  Now with worsening respiratory failure, renal failure.  Need to discuss goals of care and end of life issues with family.  Difficult situation as she has a young child.   Elenore PaddyStephanie M. Pecola Leisureeese, PA-S   Dirk DressKaty Whiteheart, NP 06/28/2013  4:27 PM Pager: [phone]) [phone] or [phone]  *Care during the described time interval was provided by me and/or other providers on the critical care team. I have reviewed this patient's available data, including medical history, events of note, physical examination and test results as part of my evaluation.   Ccm time 35 min   Mcarthur Rossettianiel J. Tyson AliasFeinstein, MD, FACP Pgr: [phone] [facility]

## 2013-06-20 NOTE — Progress Notes (Signed)
Bladder pressure 10

## 2013-06-20 NOTE — Progress Notes (Signed)
Patient received from [facility]. Patient unresponsive although on fent. Fent stopped at this time, will continue to monitor. Awaiting orders to be placed by CCM.  SHYSHKO, ALEXANDRA R

## 2013-06-20 NOTE — Progress Notes (Addendum)
ANTIBIOTIC CONSULT NOTE - INITIAL  Pharmacy Consult for Zosyn Indication:  Empiric  No Known Allergies  Patient Measurements: Height:  64 inches Weight: ~ 140 pounds  Vital Signs: Pulse Rate: 107 (06/18 1432)  Microbiology: No results found for this or any previous visit (from the past 720 hour(s)).  Medical History: Past Medical History  Diagnosis Date  . Sclerosing cholangitis   . Panic attacks   . Anxiety   . Alcohol abuse   . Ascites   . Cirrhosis of liver   . EtOH dependence 08/31/2012  . Family history of anesthesia complication     " my son had problems "  . Depression   . Anemia   . Hepatitis     Medications:  Anti-infectives   Start     Dose/Rate Route Frequency Ordered Stop   01/09/13 1800  piperacillin-tazobactam (ZOSYN) IVPB 3.375 g  Status:  Discontinued     3.375 g 12.5 mL/hr over 240 Minutes Intravenous 4 times per day 01/09/13 1455 01/09/13 1513   01/09/13 1515  piperacillin-tazobactam (ZOSYN) IVPB 3.375 g     3.375 g 12.5 mL/hr over 240 Minutes Intravenous 3 times per day 01/09/13 1513       Assessment: 43yo female with multiple medical problems as noted above.  [facility] with [facility] bleed.  She has subsequent respiratory failure requiring intubation.  She is currently un-responsive.  IV Zosyn was ordered for empiric coverage and we were asked to assist with dosing.  She had a normal creatinine last month and an estimated crcl > 50 ml/min.  Current labs are pending.  Goal of Therapy:  Therapeutic response to IV antibiotics.  Plan:  1.  Will decrease dose to Zosyn 3.375gm every 8 hours and give as continuous infusion over 4 hours. 2.  Monitor renal function, culture data and ability to streamline antibiotics  [name] MustardNita King, PharmD., MS Clinical Pharmacist Pager:  [phone] Thank you for allowing pharmacy to be part of this patients care team. 08-Feb-2013,3:14 PM  Addendum: Adding Vancomycin to regimen.   Significant change in her renal function since last month.  Creatinine currently at 1.2 which is up from 0.48.    Will start with IV Vancomycin 1gm every 12 hours and adjust if she continues to have renal decline. Monitor culture data/sensitivities as well.  [name] MustardNita King, PharmD., MS Clinical Pharmacist Pager:  [phone]

## 2013-06-21 ENCOUNTER — Inpatient Hospital Stay (HOSPITAL_COMMUNITY): Payer: Medicaid Other

## 2013-06-21 DIAGNOSIS — R6520 Severe sepsis without septic shock: Secondary | ICD-10-CM

## 2013-06-21 DIAGNOSIS — A419 Sepsis, unspecified organism: Secondary | ICD-10-CM

## 2013-06-21 DIAGNOSIS — R6521 Severe sepsis with septic shock: Secondary | ICD-10-CM

## 2013-06-21 LAB — GLUCOSE, CAPILLARY
GLUCOSE-CAPILLARY: 167 mg/dL — AB (ref 70–99)
GLUCOSE-CAPILLARY: 174 mg/dL — AB (ref 70–99)
Glucose-Capillary: 122 mg/dL — ABNORMAL HIGH (ref 70–99)
Glucose-Capillary: 156 mg/dL — ABNORMAL HIGH (ref 70–99)
Glucose-Capillary: 156 mg/dL — ABNORMAL HIGH (ref 70–99)

## 2013-06-21 LAB — POCT I-STAT 3, ART BLOOD GAS (G3+)
Acid-base deficit: 6 mmol/L — ABNORMAL HIGH (ref 0.0–2.0)
Bicarbonate: 18.5 mEq/L — ABNORMAL LOW (ref 20.0–24.0)
O2 Saturation: 94 %
PH ART: 7.361 (ref 7.350–7.450)
Patient temperature: 97.6
TCO2: 20 mmol/L (ref 0–100)
pCO2 arterial: 32.6 mmHg — ABNORMAL LOW (ref 35.0–45.0)
pO2, Arterial: 72 mmHg — ABNORMAL LOW (ref 80.0–100.0)

## 2013-06-21 LAB — PROTIME-INR
INR: 3.85 — ABNORMAL HIGH (ref 0.00–1.49)
Prothrombin Time: 36.4 seconds — ABNORMAL HIGH (ref 11.6–15.2)

## 2013-06-21 LAB — CBC
HCT: 28.6 % — ABNORMAL LOW (ref 36.0–46.0)
Hemoglobin: 9 g/dL — ABNORMAL LOW (ref 12.0–15.0)
MCH: 30.2 pg (ref 26.0–34.0)
MCHC: 31.5 g/dL (ref 30.0–36.0)
MCV: 96 fL (ref 78.0–100.0)
PLATELETS: 82 10*3/uL — AB (ref 150–400)
RBC: 2.98 MIL/uL — ABNORMAL LOW (ref 3.87–5.11)
RDW: 23.5 % — ABNORMAL HIGH (ref 11.5–15.5)
WBC: 16.6 10*3/uL — AB (ref 4.0–10.5)

## 2013-06-21 LAB — BASIC METABOLIC PANEL
BUN: 30 mg/dL — ABNORMAL HIGH (ref 6–23)
CALCIUM: 10.3 mg/dL (ref 8.4–10.5)
CO2: 18 mEq/L — ABNORMAL LOW (ref 19–32)
CREATININE: 1.11 mg/dL — AB (ref 0.50–1.10)
Chloride: 117 mEq/L — ABNORMAL HIGH (ref 96–112)
GFR calc non Af Amer: 59 mL/min — ABNORMAL LOW (ref >90)
GFR, EST AFRICAN AMERICAN: 69 mL/min — AB (ref >90)
Glucose, Bld: 150 mg/dL — ABNORMAL HIGH (ref 70–99)
Potassium: 3.7 mEq/L (ref 3.7–5.3)
Sodium: 151 mEq/L — ABNORMAL HIGH (ref 137–147)

## 2013-06-21 LAB — LACTIC ACID, PLASMA
Lactic Acid, Venous: 3.5 mmol/L — ABNORMAL HIGH (ref 0.5–2.2)
Lactic Acid, Venous: 2.7 mmol/L — ABNORMAL HIGH (ref 0.5–2.2)
Lactic Acid, Venous: 2.5 mmol/L — ABNORMAL HIGH (ref 0.5–2.2)

## 2013-06-21 LAB — CORTISOL: CORTISOL PLASMA: 124.8 ug/dL

## 2013-06-21 MED ORDER — INSULIN ASPART 100 UNIT/ML ~~LOC~~ SOLN
0.0000 [IU] | SUBCUTANEOUS | Status: DC
  Administered 2013-06-21 – 2013-06-22 (×8): 3 [IU] via SUBCUTANEOUS

## 2013-06-21 MED ORDER — ALBUMIN HUMAN 5 % IV SOLN
12.5000 g | Freq: Three times a day (TID) | INTRAVENOUS | Status: DC
  Administered 2013-06-21 – 2013-06-22 (×4): 12.5 g via INTRAVENOUS
  Filled 2013-06-21 (×7): qty 250

## 2013-06-21 MED ORDER — VITAL AF 1.2 CAL PO LIQD
1000.0000 mL | ORAL | Status: DC
  Administered 2013-06-22: 1000 mL
  Filled 2013-06-21 (×5): qty 1000

## 2013-06-21 MED ORDER — NOREPINEPHRINE BITARTRATE 1 MG/ML IV SOLN
2.0000 ug/min | INTRAVENOUS | Status: DC
  Administered 2013-06-21: 15 ug/min via INTRAVENOUS
  Filled 2013-06-21 (×2): qty 16

## 2013-06-21 MED ORDER — SODIUM CHLORIDE 0.9 % IV SOLN
100.0000 mg | Freq: Every day | INTRAVENOUS | Status: DC
  Administered 2013-06-21 – 2013-06-22 (×2): 100 mg via INTRAVENOUS
  Filled 2013-06-21 (×2): qty 100

## 2013-06-21 MED ORDER — RIFAXIMIN 550 MG PO TABS
550.0000 mg | ORAL_TABLET | Freq: Two times a day (BID) | ORAL | Status: DC
  Administered 2013-06-21 – 2013-06-22 (×3): 550 mg via ORAL
  Filled 2013-06-21 (×4): qty 1

## 2013-06-21 MED ORDER — VASOPRESSIN 20 UNIT/ML IJ SOLN
0.0300 [IU]/min | INTRAVENOUS | Status: DC
  Administered 2013-06-21: 0.03 [IU]/min via INTRAVENOUS
  Filled 2013-06-21 (×2): qty 2.5

## 2013-06-21 NOTE — Progress Notes (Signed)
INITIAL NUTRITION ASSESSMENT  DOCUMENTATION CODES Per approved criteria  -Not Applicable   INTERVENTION:  Utilize 26M PEPuP Protocol: initiate TF via OGT with Vital AF 1.2 at 25 ml/h on day 1; on day 2, increase to goal rate of 55 ml/h (1320 ml per day) to provide 1584 kcals, 99 gm protein, 1071 ml free water daily.  NUTRITION DIAGNOSIS: Inadequate oral intake related to inability to eat as evidenced by NPO status.   Goal: Intake to meet >90% of estimated nutrition needs.  Monitor:  TF tolerance/adequacy, weight trend, labs, vent status.  Reason for Assessment: VDRF  43 y.o. female  Admitting Dx: respiratory failure, GI bleed  ASSESSMENT: 43 year old female with hx of EtOH abuse and alcoholic cirrhosis, recent admit for hepatic encephalopathy (d/c 5/15) who was [facility] with variceal bleed and hemorrhagic shock. Developed worsening respiratory failure requiring intubation with progressive hypoxia and tx [facility] for management.  Discussed patient in ICU rounds today. Okay to start enteral nutrition via OGT. Unable to complete nutrition focused physical exam at this time.  Patient is currently intubated on ventilator support MV: 14.6 L/min Temp (24hrs), Avg:97.6 F (36.4 C), Min:97.4 F (36.3 C), Max:97.8 F (36.6 C)   Height: Ht Readings from Last 1 Encounters:  06/12/2013 5\' 6"  (1.676 m)    Weight: Wt Readings from Last 1 Encounters:  06/21/13 213 lb 13.5 oz (97 kg)    Ideal Body Weight: 59.1 kg  % Ideal Body Weight: 164%  Wt Readings from Last 10 Encounters:  06/21/13 213 lb 13.5 oz (97 kg)  05/13/13 194 lb 1.6 oz (88.043 kg)  04/06/13 168 lb (76.204 kg)  03/09/13 168 lb 11.2 oz (76.522 kg)  03/09/13 168 lb 11.2 oz (76.522 kg)  02/19/13 172 lb 8 oz (78.245 kg)  02/01/13 156 lb 15.5 oz (71.2 kg)  11/07/12 151 lb 10.8 oz (68.8 kg)  11/07/12 151 lb 10.8 oz (68.8 kg)  09/14/12 153 lb 3.5 oz (69.5 kg)   Weight from last  admission: 05/10/13  140 lb (63.504 kg)   Usual Body Weight: 150-170 lb  % Usual Body Weight: ~133%  BMI:  22.6 using usual weight of 140 lb  Suspect current weight is inflated due to fluid retention.  Estimated Nutritional Needs: Kcal: 1610 Protein: 90-100 gm Fluid: 1.6-1.8 L  Skin: intact  Diet Order: NPO  EDUCATION NEEDS: -Education not appropriate at this time   Intake/Output Summary (Last 24 hours) at 06/21/13 0841 Last data filed at 06/21/13 0700  Gross per 24 hour  Intake 2241.51 ml  Output    510 ml  Net 1731.51 ml    Last BM: None documented since admission   Labs:   Recent Labs Lab 06/11/2013 1454 06/21/13 0500  NA 152* 151*  K 4.4 3.7  CL 120* 117*  CO2 17* 18*  BUN 27* 30*  CREATININE 1.20* 1.11*  CALCIUM 9.7 10.3  GLUCOSE 93 150*    CBG (last 3)   Recent Labs  06/27/2013 1527 06/19/2013 2356 06/21/13 0821  GLUCAP 85 122* 167*    Scheduled Meds: . albumin human  12.5 g Intravenous Q8H  . antiseptic oral rinse  15 mL Mouth Rinse QID  . chlorhexidine  15 mL Mouth Rinse BID  . hydrocortisone sodium succinate  50 mg Intravenous Q6H  . insulin aspart  0-15 Units Subcutaneous 6 times per day  . lactulose  20 g Oral TID  . micafungin [facility](MYCAMINE) IV  100 mg Intravenous Daily  .  pantoprazole (PROTONIX) IV  40 mg Intravenous QHS  . piperacillin-tazobactam (ZOSYN)  IV  3.375 g Intravenous 3 times per day  . rifaximin  550 mg Oral Q12H  . vancomycin  1,000 mg Intravenous Q12H    Continuous Infusions: . sodium chloride 10 mL/hr at 06/10/2013 1725  . norepinephrine (LEVOPHED) Adult infusion 15 mcg/min (06/21/13 0728)  . vasopressin (PITRESSIN) infusion - *FOR SHOCK* 0.03 Units/min (06/21/13 [id number])    Past Medical History  Diagnosis Date  . Sclerosing cholangitis   . Panic attacks   . Anxiety   . Alcohol abuse   . Ascites   . Cirrhosis of liver   . EtOH dependence 08/31/2012  . Family history of anesthesia complication     " my son had  problems "  . Depression   . Anemia   . Hepatitis     Past Surgical History  Procedure Laterality Date  . None    . Ercp    . Esophagogastroduodenoscopy N/A 09/10/2012    Procedure: ESOPHAGOGASTRODUODENOSCOPY (EGD);  Surgeon: Florencia Reasonsobert V Buccini, MD;  [facility];  Service: Endoscopy;  Laterality: N/A;  . Esophagogastroduodenoscopy N/A 11/04/2012    Procedure: ESOPHAGOGASTRODUODENOSCOPY (EGD);  Surgeon: Barrie FolkJohn C Hayes, MD;  [facility];  Service: Endoscopy;  Laterality: N/A;  . Esophagogastroduodenoscopy N/A 03/06/2013    Procedure: ESOPHAGOGASTRODUODENOSCOPY (EGD);  Surgeon: Shirley FriarVincent C. Schooler, MD;  [facility];  Service: Endoscopy;  Laterality: N/A;  [facility]    Joaquin CourtsKimberly Tameron Lama, RD, LDN, CNSC Pager [phone] After Hours Pager [phone]

## 2013-06-21 NOTE — Progress Notes (Signed)
Patient came to us with a foley from [place]. The seal   Was already broken.

## 2013-06-21 NOTE — Consult Note (Signed)
Referring Provider: Dr. Tyson AliasFeinstein Primary Care Physician:  Tanna FurryHOUT, BRITTANY, PA-C Primary Gastroenterologist:  Dr. Bosie ClosSchooler  Reason for Consultation:  Cirrhosis  HPI: [name] is a 43 y.o. female with history of alcoholic cirrhosis and primary sclerosing cholangitis who has had recent encephalopathy, variceal bleeding, and ascites consistent with decompensated cirrhosis who was admitted at [facility] with variceal bleeding and hemorrhagic shock. 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 She had a Hgb 5.9 on [facility] and was hypotensive in the 70's systolic. She reportedly received 6 U PRBCs on presentation at [facility]. Transferred to [facility] yesterday for further management. No hematemesis or rectal bleeding since admit to [facility]. Large volume paracentesis on 06/17/13 that was negative for SBP. CT findings noted on PCCM admit H and P.  Results for [name] ShownUNE, Trisha (MRN [id number]) as of 06/21/2013 15:10  Ref. Range 06/21/2013 14:54  Alkaline Phosphatase Latest Range: 39-117 U/L 93  Albumin Latest Range: 3.5-5.2 g/dL 2.4 (L)  AST Latest Range: 0-37 U/L 179 (H)  ALT Latest Range: 0-35 U/L 37 (H)  Total Protein Latest Range: 6.0-8.3 g/dL 4.7 (L)  Ammonia Latest Range: 11-60 umol/L 175 (H)  Total Bilirubin Latest Range: 0.3-1.2 mg/dL 8.1 (H)       Past Medical History  Diagnosis Date  . Sclerosing cholangitis   . Panic attacks   . Anxiety   . Alcohol abuse   . Ascites   . Cirrhosis of liver   . EtOH dependence 08/31/2012  . Family history of anesthesia complication     " my son had problems "  . Depression   . Anemia   . Hepatitis     Past Surgical History  Procedure Laterality Date  . None    . Ercp    . Esophagogastroduodenoscopy N/A 09/10/2012    Procedure: ESOPHAGOGASTRODUODENOSCOPY (EGD);  Surgeon: Florencia Reasonsobert V Buccini, MD;  [facility];  Service: Endoscopy;   Laterality: N/A;  . Esophagogastroduodenoscopy N/A 11/04/2012    Procedure: ESOPHAGOGASTRODUODENOSCOPY (EGD);  Surgeon: Barrie FolkJohn C Hayes, MD;  [facility];  Service: Endoscopy;  Laterality: N/A;  . Esophagogastroduodenoscopy N/A 03/06/2013    Procedure: ESOPHAGOGASTRODUODENOSCOPY (EGD);  Surgeon: Shirley FriarVincent C. Schooler, MD;  [facility];  Service: Endoscopy;  Laterality: N/A;  [place]    Prior to Admission medications   Medication Sig Start Date End Date Taking? Authorizing Provider  buPROPion (WELLBUTRIN XL) 150 MG 24 hr tablet Take 150 mg by mouth daily.   Yes Historical Provider, MD  folic acid (FOLVITE) 800 MCG tablet Take 800 mcg by mouth daily.   Yes Historical Provider, MD  furosemide (LASIX) 20 MG tablet Take 4 tablets (80 mg total) by mouth daily. 05/17/13  Yes Tora KindredMarianne L York, PA-C  midodrine (PROAMATINE) 10 MG tablet Take 1 tablet (10 mg total) by mouth 3 (three) times daily with meals. 05/17/13  Yes Marianne L York, PA-C  Multiple Vitamins-Minerals (WOMENS ONE DAILY) TABS Take 1 tablet by mouth daily.   Yes Historical Provider, MD  pantoprazole (PROTONIX) 40 MG tablet Take 40 mg by mouth 2 (two) times daily.   Yes Historical Provider, MD  potassium chloride SA (K-DUR,KLOR-CON) 20 MEQ tablet Take 2 tablets (40 mEq total) by mouth daily. 05/17/13  Yes Marianne L York, PA-C  propranolol (INDERAL) 20 MG tablet Take 20 mg by mouth every morning.    Yes Historical Provider, MD  rifaximin (XIFAXAN) 550 MG TABS tablet Take  1 tablet (550 mg total) by mouth 2 (two) times daily. 05/17/13  Yes Tora Kindred York, PA-C  spironolactone (ALDACTONE) 50 MG tablet Take 3 tablets (150 mg total) by mouth every morning. 05/17/13  Yes Marianne L York, PA-C  thiamine (VITAMIN B-1) 100 MG tablet Take 100 mg by mouth daily.   Yes Historical Provider, MD  traMADol (ULTRAM) 50 MG tablet Take 1 tablet (50 mg total) by mouth every 6 (six) hours as needed for moderate pain. 05/17/13  Yes Tora Kindred York, PA-C   ursodiol (ACTIGALL) 300 MG capsule Take 300 mg by mouth 2 (two) times daily.   Yes Historical Provider, MD    Scheduled Meds: . albumin human  12.5 g Intravenous Q8H  . antiseptic oral rinse  15 mL Mouth Rinse QID  . chlorhexidine  15 mL Mouth Rinse BID  . hydrocortisone sodium succinate  50 mg Intravenous Q6H  . insulin aspart  0-15 Units Subcutaneous 6 times per day  . lactulose  20 g Oral TID  . micafungin [facility]) IV  100 mg Intravenous Daily  . pantoprazole (PROTONIX) IV  40 mg Intravenous QHS  . piperacillin-tazobactam (ZOSYN)  IV  3.375 g Intravenous 3 times per day  . rifaximin  550 mg Oral Q12H  . vancomycin  1,000 mg Intravenous Q12H   Continuous Infusions: . sodium chloride 10 mL/hr at 06/23/2013 1725  . norepinephrine (LEVOPHED) Adult infusion 12 mcg/min (06/21/13 1220)  . vasopressin (PITRESSIN) infusion - *FOR SHOCK* 0.03 Units/min (06/21/13 0823)   PRN Meds:.sodium chloride, fentaNYL  Allergies as of 06/23/2013  . (No Known Allergies)    Family History  Problem Relation Age of Onset  . CAD Father     History   Social History  . Marital Status: Single    Spouse Name: N/A    Number of Children: N/A  . Years of Education: N/A   Occupational History  . Not on file.   Social History Main Topics  . Smoking status: Current Every Day Smoker -- 0.50 packs/day for 20 years    Types: Cigarettes  . Smokeless tobacco: Never Used  . Alcohol Use: No     Comment: Pt drinks vodka/liquor every weekend, denies daily drinking...states she quit a month ago....  . Drug Use: Yes    Special: Marijuana     Comment: PT STATES SHE WENT THROUGH ETOH/DRUG PROGRAM AND DOES NOT DO EITHER ANYMORE  . Sexual Activity: Yes    Birth Control/ Protection: IUD   Other Topics Concern  . Not on file   Social History Narrative  . No narrative on file    Review of Systems: Unable to obtain from patient Physical Exam: Vital signs: Filed Vitals:   06/21/13 1300  BP: 107/46   Pulse: 100  Temp: 98.2  Resp: 30     General:   Intubated, sedated HEENT: +scleral icterus Lungs:  Ronchi anteriorly Heart:  Tachycardia, regular rhythm Abdomen: distended, tender with facial grimace, +BS  Rectal:  Deferred Ext: 3+ pitting edema  GI:  Lab Results:  Recent Labs  06/21/2013 1830 06/21/13 0500  WBC 12.7* 16.6*  HGB 8.8* 9.0*  HCT 28.4* 28.6*  PLT 72* 82*   BMET  Recent Labs  07/01/2013 1454 06/21/13 0500  NA 152* 151*  K 4.4 3.7  CL 120* 117*  CO2 17* 18*  GLUCOSE 93 150*  BUN 27* 30*  CREATININE 1.20* 1.11*  CALCIUM 9.7 10.3   LFT  Recent Labs  06/12/2013 1454  PROT  4.7*  ALBUMIN 2.4*  AST 179*  ALT 37*  ALKPHOS 93  BILITOT 8.1*   PT/INR  Recent Labs  06/23/2013 1454 06/21/13 0500  LABPROT 32.7* 36.4*  INR 3.35* 3.85*     Studies/Results: Dg Chest Port 1 View  06/21/2013   CLINICAL DATA:  Airspace disease, intubated patient  EXAM: PORTABLE CHEST - 1 VIEW  COMPARISON:  Portable chest x-ray of June 20, 2013 at 3:38 p.m.  FINDINGS: The lungs are adequately inflated. Confluent alveolar densities persist bilaterally but are slightly less conspicuous today. The right hemidiaphragm is better demonstrated. There is no significant pleural effusion. The cardiac silhouette is top-normal in size. The central pulmonary vascularity is prominent and stable. There are calcified right peritracheal lymph nodes.  The endotracheal tube tip lies 4.3 cm above the crotch of the carina. The esophagogastric tube tip and proximal port lie below the level of the GE junction. There are loops of mildly distended bowel in the upper abdomen. The right internal jugular venous catheter tip lies in the midportion of the SVC.  IMPRESSION: There has been mild improvement in the bilateral alveolar opacities. The support tubes and lines are in appropriate position. Gaseous distention of bowel in the upper abdomen persists.   Electronically Signed   By: David  Swazil[place]Jordan   On:  06/21/2013 07:32   Dg Chest Port 1 View  06/21/2013   CLINICAL DATA:  eval ETT post transport  EXAM: PORTABLE CHEST - 1 VIEW  COMPARISON:  Portable chest radiograph dated 06/19/2013.  FINDINGS: Endotracheal tube 4.8 cm above the carina. The right IJ central venous catheter and enteric tube are stable. No significant change in the bilateral pulmonary opacities. The cardiac silhouette is enlarged. Stable distal left clavicle fracture.  IMPRESSION: Endotracheal tube 4.8 cm above the carina.  Chest radiograph otherwise stable.   Electronically Signed   By: Salome HolmesHector  Cooper M.D.   On: 06/03/2013 15:48    Impression/Plan: Decompensated cirrhosis with respiratory failure and shock on pressors, intubated. Poor prognosis. If her respiratory status stabilized, she is NOT a liver transplant candidate due to continued alcohol use. On broad spectrum antibiotics. Continue supportive care per CCM. Continue Lactulose and Rifaximin. No evidence of GI bleeding recurrence. Would be helpful to obtain copy of EGD from [facility]. Variceal banding done by me in March 2015. My partner, Dr. Madilyn FiremanHayes, available to see this weekend.    LOS: 1 day   SCHOOLER,VINCENT C.  06/21/2013, 2:40 PM

## 2013-06-21 NOTE — Progress Notes (Addendum)
PULMONARY / CRITICAL CARE MEDICINE  Name: [name] MRN: [id number] DOB: 12-24-1970    ADMISSION DATE:  04/10/2013  REFERRING MD :  [facility] PRIMARY SERVICE: PCCM  CHIEF COMPLAINT:  Acute respiratory failure  BRIEF PATIENT DESCRIPTION:  43 yo with alcoholic cirrhosis, recent admission for hepatic encephalopathy (d/c'd 5/15) who was [facility] with variceal hemorrhage and shock.  Developed worsening respiratory failure requiring intubation with progressive hypoxia.  [facility]18 for management.  SIGNIFICANT EVENTS / STUDIES:  6/12  Admitted to [facility] 6/12  Intubated 6/13  [facility] >>> Esophageal varices 6/13  CT abdomen at [place]>>> Small volume ascites, no focal hemorrhage. Mild diffuse edema of duodenum / proximal jejunum, segmental wall thickening of ascending colon, diffuse cirrhotic change, pneumobilia, L basilar airspace opacification 6/15  Large volume [facility] 6/18  Tx [facility]  LINES / TUBES: OETT 6/12 >>> OGT 6/12 >>> R IJ CVL 6/12>>> Foley  6/12 >>>  CULTURES: 6/15 Peritoneal fluid Duke Salvia([place]) >>> neg 6/17 Sputum Duke Salvia([place]) >>>  ANTIBIOTICS: 6/18  Zosyn >>> 6/19  Vancomycin >>> 6/19  Micafungin >>>  INTERVAL HISTORY:  Increased vasopressor requirements, decrease oxygen requirenebts  VITAL SIGNS: Temp:  [97.4 F (36.3 C)-97.8 F (36.6 C)] 97.8 F (36.6 C) (06/19 0428) Pulse Rate:  [98-114] 107 (06/19 0600) Resp:  [23-36] 31 (06/19 0600) BP: (80-116)/(28-52) 107/35 mmHg (06/19 0600) SpO2:  [92 %-100 %] 97 % (06/19 0600) FiO2 (%):  [40 %-60 %] 40 % (06/19 0400) Weight:  [97 kg (213 lb 13.5 oz)] 97 kg (213 lb 13.5 oz) (06/19 0500)  HEMODYNAMICS: CVP:  [11 mmHg-17 mmHg] 11 mmHg  VENTILATOR SETTINGS: Vent Mode:  [-] PRVC FiO2 (%):  [40 %-60 %] 40 % Set Rate:  [25 bmp-30 bmp] 30 bmp Vt Set:  [420 mL-480 mL] 480 mL PEEP:  [5 cmH20-8 cmH20] 8 cmH20 Plateau Pressure:  [23  cmH20-31 cmH20] 30 cmH20  INTAKE / OUTPUT: Intake/Output     06/18 0701 - 06/19 0700 06/19 0701 - 06/20 0700   I.V. (mL/kg) 770.2 (7.9)    Other 150    NG/GT 430    IV Piggyback 825    Total Intake(mL/kg) 2175.2 (22.4)    Urine (mL/kg/hr) 510    Total Output 510     Net +1665.2           PHYSICAL EXAMINATION: General: Appears acutely ill, mechanically ventilated, synchronous Neuro: Obtunded, cough / gag diminished HEENT: Scleral icterus, OETT / OGT  Cardiovascular: Tachycardic, regular Lungs: Bilateral air entry, rhonchi Abdomen:  Ascites Musculoskeletal:  Anasarca Skin:  Intact   LABS:  CBC  Recent Labs Lab 05-Mar-2013 1830 06/21/13 0500  WBC 12.7* 16.6*  HGB 8.8* 9.0*  HCT 28.4* 28.6*  PLT 72* 82*   Coag's  Recent Labs Lab 05-Mar-2013 1454 06/21/13 0500  INR 3.35* 3.85*   BMET  Recent Labs Lab 05-Mar-2013 1454 06/21/13 0500  NA 152* 151*  K 4.4 3.7  CL 120* 117*  CO2 17* 18*  BUN 27* 30*  CREATININE 1.20* 1.11*  GLUCOSE 93 150*   Electrolytes  Recent Labs Lab 05-Mar-2013 1454 06/21/13 0500  CALCIUM 9.7 10.3   Sepsis Markers  Recent Labs Lab 05-Mar-2013 1800  LATICACIDVEN 4.5*   ABG  Recent Labs Lab 05-Mar-2013 1630 05-Mar-2013 1918 05-Mar-2013 2120  PHART 7.305* 7.226* 7.289*  PCO2ART 34.9* 43.2 35.5  PO2ART 62.0* 72.0* 88.3   Liver Enzymes  Recent Labs Lab 05-Mar-2013 1454  AST 179*  ALT 37*  ALKPHOS 93  BILITOT 8.1*  ALBUMIN 2.4*   Cardiac Enzymes No results found for this basename: TROPONINI, PROBNP,  in the last 168 hours  Glucose  Recent Labs Lab 2013-05-25 1527 2013-05-25 2356  GLUCAP 85 122*   IMAGING:  Dg Chest Port 1 View  May 21, 2013   CLINICAL DATA:  eval ETT post transport  EXAM: PORTABLE CHEST - 1 VIEW  COMPARISON:  Portable chest radiograph dated 06/19/2013.  FINDINGS: Endotracheal tube 4.8 cm above the carina. The right IJ central venous catheter and enteric tube are stable. No significant change in the bilateral  pulmonary opacities. The cardiac silhouette is enlarged. Stable distal left clavicle fracture.  IMPRESSION: Endotracheal tube 4.8 cm above the carina.  Chest radiograph otherwise stable.   Electronically Signed   By: Salome HolmesHector  Cooper M.D.   On: 0May 19, 2015 15:48   ASSESSMENT / PLAN:  PULMONARY A: Acute respiratory failure Possible aspiration pneumonia Possible pulmonary edema Possible ARDS P:   Goal pH>7.30, SpO2>92, PLP<30 Continuous mechanical support, high Ve VAP bundle Daily SBT Trend ABG/CXR; ABG now  CARDIOVASCULAR A: Shock, likely septic R/o pericardial effusion P:  Goal MAP>65 Trend lactate Levophed gtt Start Vasopressin gtt Start Albumin q8h Place a-line  RENAL A: AKI Hypernatremia Hyperchloremia Stay balance 1.5L, clinically total body fluid overload CVP 11 P:   Goal CVP 10-12 D/c free water NS boluses to goal CVP  GASTROINTESTINAL A: Alcoholic cirrhosis  Variceal hemorrhage, resolved Malnutrition GIPx P: NPO TF if OK with GI Protonix  HEMATOLOGIC A: Anemia Thrombocytopenia Coagulopathy No active hemorrhage VTE Px P:  Trend CBC SCD  INFECTIOUS A:  Possible aspiration pneumonia Possible SBP / intraabdominal sepsis P:   Continue Zosyn Add Vancomycin Add Micafungin  ENDOCRINE A: Suspected adrenal insufficiency, cortisol 124 but not clear if was drawn before or after empirical steroids given At risk for hyper / hypoglycemia P:   Continue hydrocortisone CBG / SSI  NEUROLOGIC A: Encephalopathy - multifactorial  P:   RASS goal 0 to -1 Lactulose Add Rifaximine  Fentanyl PRN  Family updated at bedside.  Per their request - partial code, NO chest compressions, NO defibrillation / cardioversion.  Otherwise will continue aggressive medical care for now.  I have personally obtained history, examined patient, evaluated and interpreted laboratory and imaging results, reviewed medical records, formulated assessment / plan and placed  orders.  CRITICAL CARE:  The patient is critically ill with multiple organ systems failure and requires high complexity decision making for assessment and support, frequent evaluation and titration of therapies, application of advanced monitoring technologies and extensive interpretation of multiple databases. Critical Care Time devoted to patient care services described in this note is 40 minutes.   Lonia FarberZUBELEVITSKIY, Della Scrivener, MD Pulmonary and Critical Care Medicine [facility] Pager: [phone]  06/21/2013, 7:39 AM

## 2013-06-21 NOTE — Procedures (Signed)
Arterial Catheter Insertion Procedure Note [name] [id number] Jan 23, 1970  Procedure: Insertion of Arterial Catheter  Indications: Blood pressure monitoring  Procedure Details Consent: Unable to obtain consent because of altered level of consciousness. Time Out: Verified patient identification, verified procedure, site/side was marked, verified correct patient position, special equipment/implants available, medications/allergies/relevent history reviewed, required imaging and test results available.  Performed  Maximum sterile technique was used including antiseptics, cap, gloves, gown, hand hygiene, mask and sheet. Skin prep: Chlorhexidine; local anesthetic administered 20 gauge catheter was inserted into left radial artery using the Seldinger technique.  Evaluation Blood flow good; BP tracing good. Complications: No apparent complications.   [name], [name] 06/21/2013

## 2013-06-21 NOTE — Progress Notes (Signed)
Utilization review completed. Bertha Stanfill, RN, BSN. 

## 2013-06-21 NOTE — Progress Notes (Signed)
[facility] ADULT [facility] REPLACEMENT PROTOCOL FOR AM LAB REPLACEMENT ONLY  The patient does not apply for the [facility] Adult [facility] Electrolyte Replacment Protocol based on the criteria listed below:   1. Is GFR >/= 40 ml/min? yes  Patient's GFR today is 59 2. Is urine output >/= 0.5 ml/kg/hr for the last 6 hours? no Patient's UOP is 0.2 ml/kg/hr 3. Is BUN < 60 mg/dL? yes  Patient's BUN today is 30 4. Abnormal electrolyte(s): K+3.7 5. Ordered repletion with: NA 6. If a panic level lab has been reported, has the CCM MD in charge been notified? yes.   Physician:  Idalia NeedleByrum  Bradshaw, Paul Hilliard 06/21/2013 6:07 AM

## 2013-06-22 ENCOUNTER — Inpatient Hospital Stay (HOSPITAL_COMMUNITY): Payer: Medicaid Other

## 2013-06-22 ENCOUNTER — Encounter (HOSPITAL_COMMUNITY): Payer: Self-pay | Admitting: Radiology

## 2013-06-22 LAB — CBC
HEMATOCRIT: 27.3 % — AB (ref 36.0–46.0)
Hemoglobin: 8.7 g/dL — ABNORMAL LOW (ref 12.0–15.0)
MCH: 29.8 pg (ref 26.0–34.0)
MCHC: 31.9 g/dL (ref 30.0–36.0)
MCV: 93.5 fL (ref 78.0–100.0)
Platelets: 48 10*3/uL — ABNORMAL LOW (ref 150–400)
RBC: 2.92 MIL/uL — ABNORMAL LOW (ref 3.87–5.11)
RDW: 23.6 % — AB (ref 11.5–15.5)
WBC: 10.6 10*3/uL — AB (ref 4.0–10.5)

## 2013-06-22 LAB — BASIC METABOLIC PANEL
BUN: 37 mg/dL — AB (ref 6–23)
CHLORIDE: 114 meq/L — AB (ref 96–112)
CO2: 21 meq/L (ref 19–32)
CREATININE: 0.82 mg/dL (ref 0.50–1.10)
Calcium: 10.6 mg/dL — ABNORMAL HIGH (ref 8.4–10.5)
GFR calc non Af Amer: 86 mL/min — ABNORMAL LOW (ref >90)
Glucose, Bld: 184 mg/dL — ABNORMAL HIGH (ref 70–99)
POTASSIUM: 3.4 meq/L — AB (ref 3.7–5.3)
Sodium: 151 mEq/L — ABNORMAL HIGH (ref 137–147)

## 2013-06-22 LAB — GLUCOSE, CAPILLARY
GLUCOSE-CAPILLARY: 166 mg/dL — AB (ref 70–99)
Glucose-Capillary: 154 mg/dL — ABNORMAL HIGH (ref 70–99)
Glucose-Capillary: 165 mg/dL — ABNORMAL HIGH (ref 70–99)
Glucose-Capillary: 187 mg/dL — ABNORMAL HIGH (ref 70–99)

## 2013-06-22 LAB — HEPATIC FUNCTION PANEL
ALBUMIN: 2.9 g/dL — AB (ref 3.5–5.2)
ALK PHOS: 66 U/L (ref 39–117)
ALT: 46 U/L — ABNORMAL HIGH (ref 0–35)
AST: 184 U/L — ABNORMAL HIGH (ref 0–37)
BILIRUBIN TOTAL: 9.4 mg/dL — AB (ref 0.3–1.2)
Bilirubin, Direct: 5.2 mg/dL — ABNORMAL HIGH (ref 0.0–0.3)
Indirect Bilirubin: 4.2 mg/dL — ABNORMAL HIGH (ref 0.3–0.9)
TOTAL PROTEIN: 5.3 g/dL — AB (ref 6.0–8.3)

## 2013-06-22 LAB — MAGNESIUM: Magnesium: 1.3 mg/dL — ABNORMAL LOW (ref 1.5–2.5)

## 2013-06-22 LAB — PHOSPHORUS: Phosphorus: 2.9 mg/dL (ref 2.3–4.6)

## 2013-06-22 MED ORDER — MORPHINE BOLUS VIA INFUSION
5.0000 mg | INTRAVENOUS | Status: DC | PRN
  Filled 2013-06-22: qty 20

## 2013-06-22 MED ORDER — POTASSIUM CHLORIDE 10 MEQ/100ML IV SOLN
10.0000 meq | INTRAVENOUS | Status: AC
  Administered 2013-06-22 (×4): 10 meq via INTRAVENOUS
  Filled 2013-06-22: qty 100

## 2013-06-22 MED ORDER — MAGNESIUM SULFATE 40 MG/ML IJ SOLN
2.0000 g | Freq: Once | INTRAMUSCULAR | Status: AC
  Administered 2013-06-22: 2 g via INTRAVENOUS
  Filled 2013-06-22: qty 50

## 2013-06-22 MED ORDER — MORPHINE SULFATE 10 MG/ML IJ SOLN
10.0000 mg/h | INTRAMUSCULAR | Status: DC
  Administered 2013-06-22: 20 mg/h via INTRAVENOUS
  Administered 2013-06-22 (×2): 10 mg/h via INTRAVENOUS
  Filled 2013-06-22: qty 10

## 2013-06-23 LAB — CULTURE, RESPIRATORY

## 2013-06-23 LAB — CULTURE, RESPIRATORY W GRAM STAIN: Culture: NO GROWTH

## 2013-06-27 LAB — CULTURE, BLOOD (ROUTINE X 2)
CULTURE: NO GROWTH
Culture: NO GROWTH

## 2013-06-28 NOTE — Discharge Summary (Signed)
NAMGearldine [name]:  King, [name]                  ACCOUNT NO.:  [id number]  MEDICAL RECORD NO.:  [id number]  LOCATION:                                 FACILITY:  PHYSICIAN:  Felipa EvenerWesam Jake Yacoub, MD  DATE OF BIRTH:  02-Apr-1970  DATE OF ADMISSION:  06/14/2013 DATE OF DISCHARGE:  Jul 29, 2013                              DISCHARGE SUMMARY   DEATH SUMMARY  PRIMARY DIAGNOSIS/CAUSE OF DEATH:  Liver failure.  SECONDARY DIAGNOSES:  Respiratory failure, hepatic encephalopathy, renal failure, pulmonary edema, aspiration pneumonia, septic shock, hypernatremia, hyperchloremia, acute kidney injury, alcoholic cirrhosis, variceal bleeding.  HOSPITAL COURSE:  The patient is a 43 year old female with past medical history significant for alcohol abuse who had developed liver cirrhosis and [facility] with confusion, who was [facility] for respiratory failure in need for critical care management.  The patient was treated in the intensive care unit, however, failed to show any signs of improvement after discussion with the family.  Decision was made that the patient would not want this level of care and the decision was made to transition to comfort care at which point, morphine was started.  Patient was extubated and expired shortly thereafter.     Felipa EvenerWesam Jake Yacoub, MD     WJY/MEDQ  D:  06/27/2013  T:  06/28/2013  Job:  [id number]

## 2013-07-03 NOTE — Progress Notes (Signed)
[facility] Progress Note  Subjective: Relatively stable on ventilator overnight, no reports of any significant GI bleeding. Hemoglobin 8.7.   Objective: Vital signs in last 24 hours: Temp:  [97.8 F (36.6 C)-98.6 F (37 C)] 97.8 F (36.6 C) (06/20 0735) Pulse Rate:  [95-117] 115 (06/20 0730) Resp:  [30-37] 34 (06/20 0730) BP: (107-143)/(39-74) 114/74 mmHg (06/20 0600) SpO2:  [83 %-99 %] 94 % (06/20 0730) Arterial Line BP: (100-175)/(42-63) 175/63 mmHg (06/20 0600) FiO2 (%):  [40 %-60 %] 60 % (06/20 1136) Weight:  [97 kg (213 lb 13.5 oz)] 97 kg (213 lb 13.5 oz) (06/20 0500) Weight change: 0 kg (0 lb)   PE: Sedated, intubated some serosanguineous fluid in her NG tube. Abdomen moderately distended  Lab Results: Results for orders placed during the hospital encounter of 06/12/2013 (from the past 24 hour(s))  LACTIC ACID, PLASMA     Status: Abnormal   Collection Time    06/21/13  1:13 PM      Result Value Ref Range   Lactic Acid, Venous 2.7 (*) 0.5 - 2.2 mmol/L  GLUCOSE, CAPILLARY     Status: Abnormal   Collection Time    06/21/13  3:15 PM      Result Value Ref Range   Glucose-Capillary 156 (*) 70 - 99 mg/dL   Comment 1 Notify RN    LACTIC ACID, PLASMA     Status: Abnormal   Collection Time    06/21/13  7:32 PM      Result Value Ref Range   Lactic Acid, Venous 2.5 (*) 0.5 - 2.2 mmol/L  GLUCOSE, CAPILLARY     Status: Abnormal   Collection Time    06/21/13  7:34 PM      Result Value Ref Range   Glucose-Capillary 174 (*) 70 - 99 mg/dL   Comment 1 Notify RN    GLUCOSE, CAPILLARY     Status: Abnormal   Collection Time    06/21/13 11:57 PM      Result Value Ref Range   Glucose-Capillary 154 (*) 70 - 99 mg/dL   Comment 1 Documented in Chart     Comment 2 Notify RN    GLUCOSE, CAPILLARY     Status: Abnormal   Collection Time    12-Jul-2013  4:05 AM      Result Value Ref Range   Glucose-Capillary 166 (*) 70 - 99 mg/dL   Comment 1 Documented in Chart     Comment 2  Notify RN    CBC     Status: Abnormal   Collection Time    2013-07-12  5:00 AM      Result Value Ref Range   WBC 10.6 (*) 4.0 - 10.5 K/uL   RBC 2.92 (*) 3.87 - 5.11 MIL/uL   Hemoglobin 8.7 (*) 12.0 - 15.0 g/dL   HCT 16.1 (*) 09.6 - 04.5 %   MCV 93.5  78.0 - 100.0 fL   MCH 29.8  26.0 - 34.0 pg   MCHC 31.9  30.0 - 36.0 g/dL   RDW 40.9 (*) 81.1 - 91.4 %   Platelets 48 (*) 150 - 400 K/uL  BASIC METABOLIC PANEL     Status: Abnormal   Collection Time    2013-07-12  5:00 AM      Result Value Ref Range   Sodium 151 (*) 137 - 147 mEq/L   Potassium 3.4 (*) 3.7 - 5.3 mEq/L   Chloride 114 (*) 96 - 112 mEq/L   CO2 21  19 - 32 mEq/L   Glucose, Bld 184 (*) 70 - 99 mg/dL   BUN 37 (*) 6 - 23 mg/dL   Creatinine, Ser 1.610.82  0.50 - 1.10 mg/dL   Calcium 09.610.6 (*) 8.4 - 10.5 mg/dL   GFR calc non Af Amer 86 (*) >90 mL/min   GFR calc Af Amer >90  >90 mL/min  MAGNESIUM     Status: Abnormal   Collection Time    06/23/2013  5:00 AM      Result Value Ref Range   Magnesium 1.3 (*) 1.5 - 2.5 mg/dL  PHOSPHORUS     Status: None   Collection Time    06/09/2013  5:00 AM      Result Value Ref Range   Phosphorus 2.9  2.3 - 4.6 mg/dL  HEPATIC FUNCTION PANEL     Status: Abnormal   Collection Time    06/07/2013  5:00 AM      Result Value Ref Range   Total Protein 5.3 (*) 6.0 - 8.3 g/dL   Albumin 2.9 (*) 3.5 - 5.2 g/dL   AST 045184 (*) 0 - 37 U/L   ALT 46 (*) 0 - 35 U/L   Alkaline Phosphatase 66  39 - 117 U/L   Total Bilirubin 9.4 (*) 0.3 - 1.2 mg/dL   Bilirubin, Direct 5.2 (*) 0.0 - 0.3 mg/dL   Indirect Bilirubin 4.2 (*) 0.3 - 0.9 mg/dL  GLUCOSE, CAPILLARY     Status: Abnormal   Collection Time    06/07/2013  7:06 AM      Result Value Ref Range   Glucose-Capillary 187 (*) 70 - 99 mg/dL  GLUCOSE, CAPILLARY     Status: Abnormal   Collection Time    06/26/2013 11:06 AM      Result Value Ref Range   Glucose-Capillary 165 (*) 70 - 99 mg/dL    Studies/Results: Dg Chest Port 1 View  07/02/2013   CLINICAL DATA:   Re-evaluate bilateral infiltrates  EXAM: PORTABLE CHEST - 1 VIEW  COMPARISON:  06/21/2013  FINDINGS: Mild cardiac enlargement stable. Lines and tubes in unchanged position. Calcified right paratracheal lymph nodes stable. Severe bilateral pulmonary parenchymal infiltrates unchanged. No pleural effusions.  IMPRESSION: Given the severe persistent bilateral airspace opacities with out pleural effusion, ARDS suspected.   Electronically Signed   By: Esperanza Heiraymond  Rubner M.D.   On: 06/23/2013 07:20   Dg Chest Port 1 View  06/21/2013   CLINICAL DATA:  Airspace disease, intubated patient  EXAM: PORTABLE CHEST - 1 VIEW  COMPARISON:  Portable chest x-ray of June 20, 2013 at 3:38 p.m.  FINDINGS: The lungs are adequately inflated. Confluent alveolar densities persist bilaterally but are slightly less conspicuous today. The right hemidiaphragm is better demonstrated. There is no significant pleural effusion. The cardiac silhouette is top-normal in size. The central pulmonary vascularity is prominent and stable. There are calcified right peritracheal lymph nodes.  The endotracheal tube tip lies 4.3 cm above the crotch of the carina. The esophagogastric tube tip and proximal port lie below the level of the GE junction. There are loops of mildly distended bowel in the upper abdomen. The right internal jugular venous catheter tip lies in the midportion of the SVC.  IMPRESSION: There has been mild improvement in the bilateral alveolar opacities. The support tubes and lines are in appropriate position. Gaseous distention of bowel in the upper abdomen persists.   Electronically Signed   By: David  SwazilandJordan   On: 06/21/2013 07:32   Dg  Chest Port 1 View  February 20, 2013   CLINICAL DATA:  eval ETT post transport  EXAM: PORTABLE CHEST - 1 VIEW  COMPARISON:  Portable chest radiograph dated 06/19/2013.  FINDINGS: Endotracheal tube 4.8 cm above the carina. The right IJ central venous catheter and enteric tube are stable. No significant change in  the bilateral pulmonary opacities. The cardiac silhouette is enlarged. Stable distal left clavicle fracture.  IMPRESSION: Endotracheal tube 4.8 cm above the carina.  Chest radiograph otherwise stable.   Electronically Signed   By: Salome HolmesHector  Cooper M.D.   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 With respiratory failure/ARDS , recent GI bleeding,  possible sepsis/SBP  Plan: Continue all current supportive measures. Prognosis appears poor overall. If recurrent GI bleeding attempt repeat endoscopy and consider banding of any bleeding esophageal varices. Continue antibiotics and pressors when necessary. Will continue to follow.    HAYES,JOHN C 06/12/2013, 12:45 PM

## 2013-07-03 NOTE — Progress Notes (Addendum)
Patient shows no signs of life. Auscultated heart sounds for 5 minutes with no audible sounds. Verified by Fayrene FearingLaura Turner, RN and myself. Time of death, [id number]. Mother, Fulton Molelice, was at bedside. ECG strips placed in chart.  Zenovia JordanBridget Williford, RN

## 2013-07-03 NOTE — Procedures (Signed)
Extubation Procedure Note  Patient Details:   Name: [name] DOB: 06-Jul-1970 MRN: [id number]   Airway Documentation:     Evaluation  O2 sats: currently acceptable Complications: No apparent complications Patient did tolerate procedure well. Bilateral Breath Sounds: Coarse crackles Suctioning: Airway No  Pt terminally extubated per MD order.   RN at bedside.  RT will continue to monitor.  Closson, Terie PurserMorgan Blake 06/07/2013, 4:56 PM

## 2013-07-03 NOTE — Progress Notes (Addendum)
25 mL morphine 1 mg/ml wasted in sink. [place], Melissa Hope Witness: Karle BarrLaura Turner,RN

## 2013-07-03 NOTE — Progress Notes (Signed)
PULMONARY / CRITICAL CARE MEDICINE  Name: [name] MRN: [id number] DOB: [birth year]    ADMISSION DATE:  06/03/2013  REF[facility] MD :  [facility] PRIMARY SERVICE: PCCM  CHIEF COMPLAINT:  Acute respiratory failure  BRIEF PATIENT DESCRIPTION:  43 yo with alcoholic cirrhosis, recent admission for hepatic encephalopathy (d/c'd 5/15) who was [facility] with variceal hemorrhage and shock.  Developed worsening respiratory failure requiring intubation with progressive hypoxia.  [facility].  SIGNIFICANT EVENTS / STUDIES:  6/12  [facility]  Intubated 6/13  [facility] >>> Esophageal varices 6/13  CT abdomen at [place]>>> Small volume ascites, no focal hemorrhage. Mild diffuse edema of duodenum / proximal jejunum, segmental wall thickening of ascending colon, diffuse cirrhotic change, pneumobilia, L basilar airspace opacification 6/15  [facility]  Tx to [facility]  LINES / TUBES: OETT 6/12 >>> OGT 6/12 >>> R IJ CVL 6/12>>> L Rad aline 6/19>>>  CULTURES: 6/15 Peritoneal fluid [facility]([place]) >>> neg 6/17 Sputum [facility]([place]) >>> 6/18 sputum >>> 6/18 BCx2>>>  ANTIBIOTICS: 6/18  Zosyn >>> 6/19  Vancomycin >>> 6/19  Micafungin >>>  INTERVAL HISTORY:  Improved O2 and pressor needs. Worsening thrombocytopenia.    VITAL SIGNS: Temp:  [97.8 F (36.6 C)-98.6 F (37 C)] 97.8 F (36.6 C) (06/20 0735) Pulse Rate:  [95-117] 115 (06/20 0730) Resp:  [29-37] 34 (06/20 0730) BP: (107-147)/(37-74) 114/74 mmHg (06/20 0600) SpO2:  [83 %-99 %] 94 % (06/20 0730) Arterial Line BP: (100-175)/(42-63) 175/63 mmHg (06/20 0600) FiO2 (%):  [40 %] 40 % (06/20 0730) Weight:  [213 lb 13.5 oz (97 kg)] 213 lb 13.5 oz (97 kg) (06/20 0500)  HEMODYNAMICS: CVP:  [11 mmHg-18 mmHg] 12 mmHg  VENTILATOR SETTINGS: Vent Mode:  [-] PRVC FiO2 (%):  [40 %] 40 % Set Rate:  [30 bmp] 30 bmp Vt Set:  [480 mL]  480 mL PEEP:  [8 cmH20] 8 cmH20 Plateau Pressure:  [26 cmH20-30 cmH20] 26 cmH20  INTAKE / OUTPUT: Intake/Output     06/19 0701 - 06/20 0700 06/20 0701 - 06/21 0700   I.V. (mL/kg) 655.3 (6.8)    Other     NG/GT 100    IV Piggyback 1150    Total Intake(mL/kg) 1905.3 (19.6)    Urine (mL/kg/hr) 1350 (0.6) 125 (0.4)   Stool 400 (0.2)    Total Output 1750 125   Net +155.3 -125         PHYSICAL EXAMINATION: General: Appears acutely ill, mechanically ventilated, assynchronous Neuro: Obtunded, cough / gag diminished HEENT: Scleral icterus, OETT / OGT, pupils 5mm, sluggish  Cardiovascular: Tachycardic, regular Lungs: resps even, mildly labored, coarse throughout  Abdomen: dirm, extensive Ascites Musculoskeletal:  Anasarca Skin:  Intact   LABS:  CBC  Recent Labs Lab 06/04/2013 1830 06/21/13 0500 2013-05-10 0500  WBC 12.7* 16.6* 10.6*  HGB 8.8* 9.0* 8.7*  HCT 28.4* 28.6* 27.3*  PLT 72* 82* 48*   Coag's  Recent Labs Lab 06/08/2013 1454 06/21/13 0500  INR 3.35* 3.85*   BMET  Recent Labs Lab 06/23/2013 1454 06/21/13 0500 2013-05-10 0500  NA 152* 151* 151*  K 4.4 3.7 3.4*  CL 120* 117* 114*  CO2 17* 18* 21  BUN 27* 30* 37*  CREATININE 1.20* 1.11* 0.82  GLUCOSE 93 150* 184*   Electrolytes  Recent Labs Lab 06/10/2013 1454 06/21/13 0500 2013-05-10 0500  CALCIUM 9.7 10.3 10.6*  MG  --   --  1.3*  PHOS  --   --  2.9   Sepsis Markers  Recent Labs Lab 06/21/13 0732 06/21/13 1313 06/21/13 1932  LATICACIDVEN 3.5* 2.7* 2.5*   ABG  Recent Labs Lab 06/13/2013 1918 07/02/2013 2120 06/21/13 0759  PHART 7.226* 7.289* 7.361  PCO2ART 43.2 35.5 32.6*  PO2ART 72.0* 88.3 72.0*   Liver Enzymes  Recent Labs Lab 06/15/2013 1454 06/18/2013 0500  AST 179* 184*  ALT 37* 46*  ALKPHOS 93 66  BILITOT 8.1* 9.4*  ALBUMIN 2.4* 2.9*   Cardiac Enzymes No results found for this basename: TROPONINI, PROBNP,  in the last 168 hours  Glucose  Recent Labs Lab 06/21/13 1122  06/21/13 1515 06/21/13 1934 06/21/13 2357 06/06/2013 0405 06/15/2013 0706  GLUCAP 156* 156* 174* 154* 166* 187*   IMAGING:  Dg Chest Port 1 View  06/05/2013   CLINICAL DATA:  Re-evaluate bilateral infiltrates  EXAM: PORTABLE CHEST - 1 VIEW  COMPARISON:  06/21/2013  FINDINGS: Mild cardiac enlargement stable. Lines and tubes in unchanged position. Calcified right paratracheal lymph nodes stable. Severe bilateral pulmonary parenchymal infiltrates unchanged. No pleural effusions.  IMPRESSION: Given the severe persistent bilateral airspace opacities with out pleural effusion, ARDS suspected.   Electronically Signed   By: Esperanza Heir M.D.   On: 06/23/2013 07:20   Dg Chest Port 1 View  06/21/2013   CLINICAL DATA:  Airspace disease, intubated patient  EXAM: PORTABLE CHEST - 1 VIEW  COMPARISON:  Portable chest x-ray of June 20, 2013 at 3:38 p.m.  FINDINGS: The lungs are adequately inflated. Confluent alveolar densities persist bilaterally but are slightly less conspicuous today. The right hemidiaphragm is better demonstrated. There is no significant pleural effusion. The cardiac silhouette is top-normal in size. The central pulmonary vascularity is prominent and stable. There are calcified right peritracheal lymph nodes.  The endotracheal tube tip lies 4.3 cm above the crotch of the carina. The esophagogastric tube tip and proximal port lie below the level of the GE junction. There are loops of mildly distended bowel in the upper abdomen. The right internal jugular venous catheter tip lies in the midportion of the SVC.  IMPRESSION: There has been mild improvement in the bilateral alveolar opacities. The support tubes and lines are in appropriate position. Gaseous distention of bowel in the upper abdomen persists.   Electronically Signed   By: David  [place]   On: 06/21/2013 07:32   Dg Chest Port 1 View  06/29/2013   CLINICAL DATA:  eval ETT post transport  EXAM: PORTABLE CHEST - 1 VIEW  COMPARISON:  Portable  chest radiograph dated 06/19/2013.  FINDINGS: Endotracheal tube 4.8 cm above the carina. The right IJ central venous catheter and enteric tube are stable. No significant change in the bilateral pulmonary opacities. The cardiac silhouette is enlarged. Stable distal left clavicle fracture.  IMPRESSION: Endotracheal tube 4.8 cm above the carina.  Chest radiograph otherwise stable.   Electronically Signed   By: Salome Holmes M.D.   On: 06/15/2013 15:48   ASSESSMENT / PLAN:  PULMONARY A: Acute respiratory failure Possible aspiration pneumonia Pulmonary edema Possible ARDS P:   Goal pH>7.30, SpO2>92, PLP<30 F/u ABG F/u CXR  VAP bundle Daily SBT See discussion below   CARDIOVASCULAR A: Shock, ?septic v hypovolemic in setting massive fluid shifting.  Off pressors  P:  Goal MAP>65 Trend lactate Do not resume pressors  Albumin q8h   RENAL A: AKI Hypernatremia Hyperchloremia CVP 11 P:   Goal CVP 10-12 F/u chem  Trend cvp  GASTROINTESTINAL A: Alcoholic cirrhosis - continued ETOH Variceal hemorrhage, resolved Malnutrition GIPx P: NPO Not tol TF - cont trickle rate  Protonix GI following - nothing more to offer.  NOT transplant candidate  HEMATOLOGIC A: Anemia Thrombocytopenia Coagulopathy No active hemorrhage VTE Px P:  Trend CBC SCD  INFECTIOUS A:  Possible aspiration pneumonia Possible SBP / intraabdominal sepsis P:   Cont abx as above for now - see discussion below   ENDOCRINE A: Suspected adrenal insufficiency, cortisol 124 but not clear if was drawn before or after empirical steroids given At risk for hyper / hypoglycemia P:   Continue hydrocortisone CBG / SSI  NEUROLOGIC A: Encephalopathy - multifactorial  P:   RASS goal 0 to -1 Lactulose Add Rifaximine  Fentanyl PRN  Dirk DressKaty Whiteheart, NP 07-22-2013  9:55 AM Pager: [phone] or [phone]  Family/POA updated at length at bedside.  Overall prognosis remains extremely poor.  He  is concerned about her comfort and feels that this is "the end".  She has been clear in the past about not wanting prolonged aggressive support and in this situation she would want to be comfortable.  He will communicate with her mother today who has already discussed funeral arrangements.  Anticipate transition to comfort care later today.  For now will continue LCB with no escalation of current care including not resuming pressors which have just been weaned off.  CRITICAL CARE:  The patient is critically ill with multiple organ systems failure and requires high complexity decision making for assessment and support, frequent evaluation and titration of therapies, application of advanced monitoring technologies and extensive interpretation of multiple databases. Critical Care Time devoted to patient care services described in this note is 40 minutes.   Patient seen and examined, agree with above note.  I dictated the care and orders written for this patient under my direction.  Alyson ReedyWesam G. Yacoub, M.D. [facility] Pulmonary/Critical Care Medicine. Pager: [phone]. After hours pager: [phone].

## 2013-07-03 NOTE — Progress Notes (Signed)
eLink Physician-Brief Progress Note Patient Name: [name] DOB: 10/14/1970 MRN: [id number]  Date of Service  06/09/2013   HPI/Events of Note   Low K Low mg  eICU Interventions  replaced   Intervention Category Minor Interventions: Electrolytes abnormality - evaluation and management  Henry RusselSMITH, JAMES, P 06/29/2013, 6:35 AM

## 2013-07-03 NOTE — Progress Notes (Signed)
[facility]OCOL FOR AM LAB REPLACEMENT ONLY  The patient does not apply for the [facility] Electrolyte Replac[facility] Protocol based on the criteria listed below:   1. Is GFR >/= 40 ml/min? yes  Patient's GFR today is 86 2. Is urine output >/= 0.5 ml/kg/hr for the last 6 hours? no Patient's UOP is 0.1 ml/kg/hr 3. Is BUN < 60 mg/dL? yes  Patient's BUN today is 37 4. Abnormal electrolyte(s): K+3.4  Mg 1.3 5. Ordered repletion with: NA 6. If a panic level lab has been reported, has the CCM MD in charge been notified? yes.   Physician:  Pasty ArchJ Smith  Cathlean CowerBradshaw, Paul [facility] 06/08/2013 6:29 AM

## 2013-07-03 DEATH — deceased

## 2014-09-10 IMAGING — US US ABDOMEN LIMITED
1 series · 8 of 8 positions shown · non-contrast
Comparison: 09/14/2012 and earlier.

CLINICAL DATA: 42-year-old female with altered mental status.
History of cirrhosis and recurrent ascites. Initial encounter.

EXAM:
US ABDOMEN LIMITED - ascites

[Series 1: us abdomen limited · 0.27mm/px · 8 of 8 slices shown]
[im 1/8]
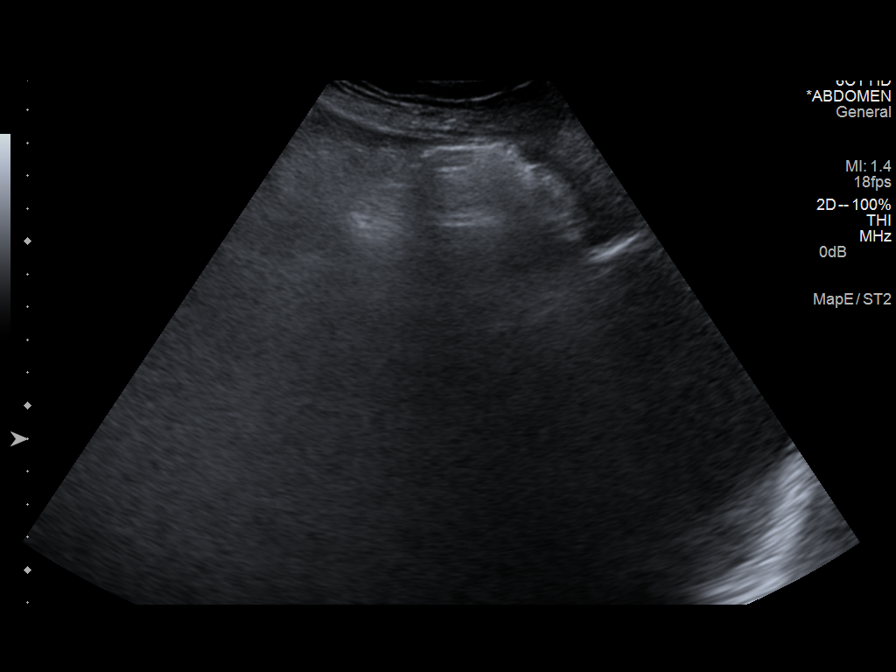
[im 2/8]
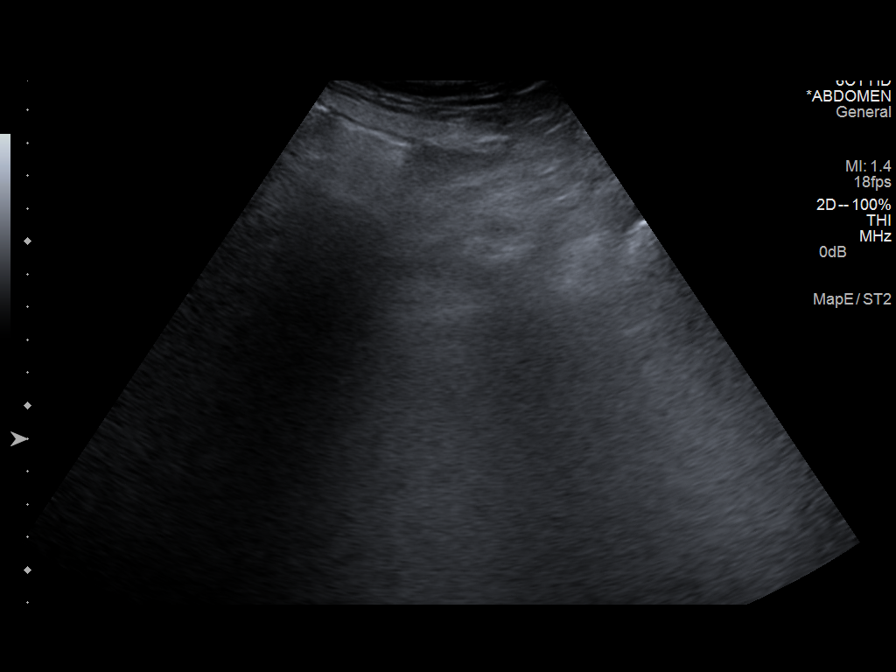
[im 3/8]
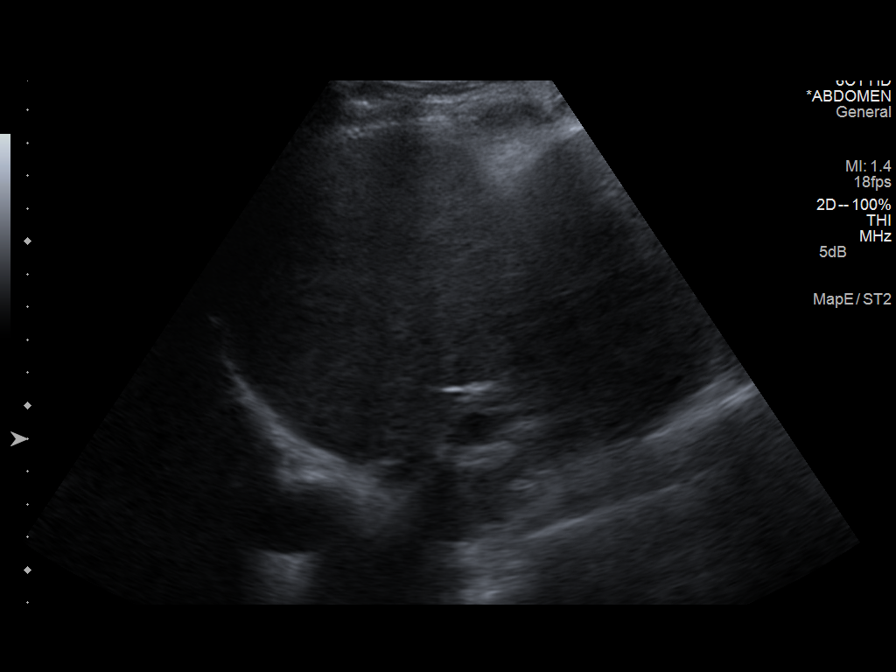
[im 4/8]
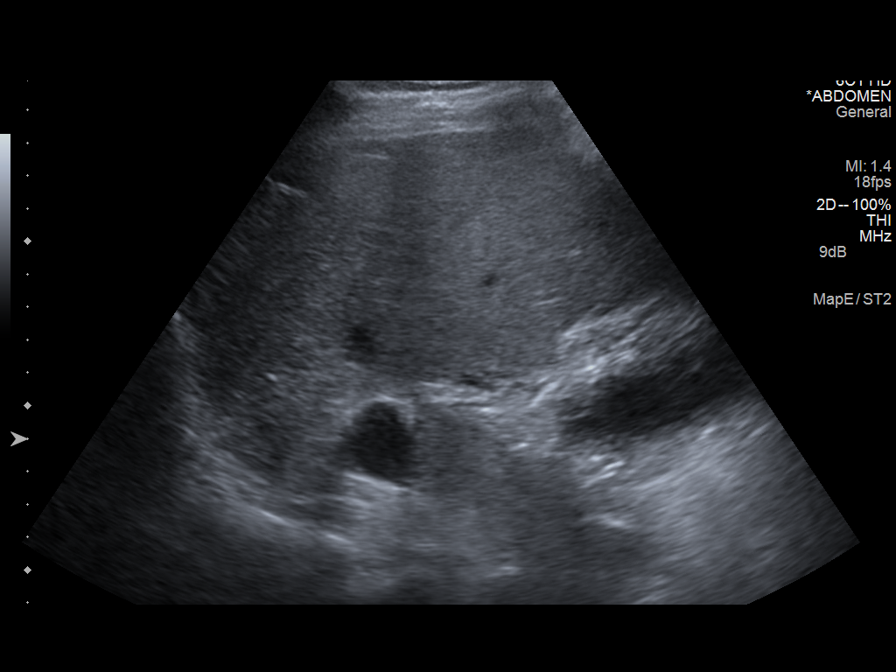
[im 5/8]
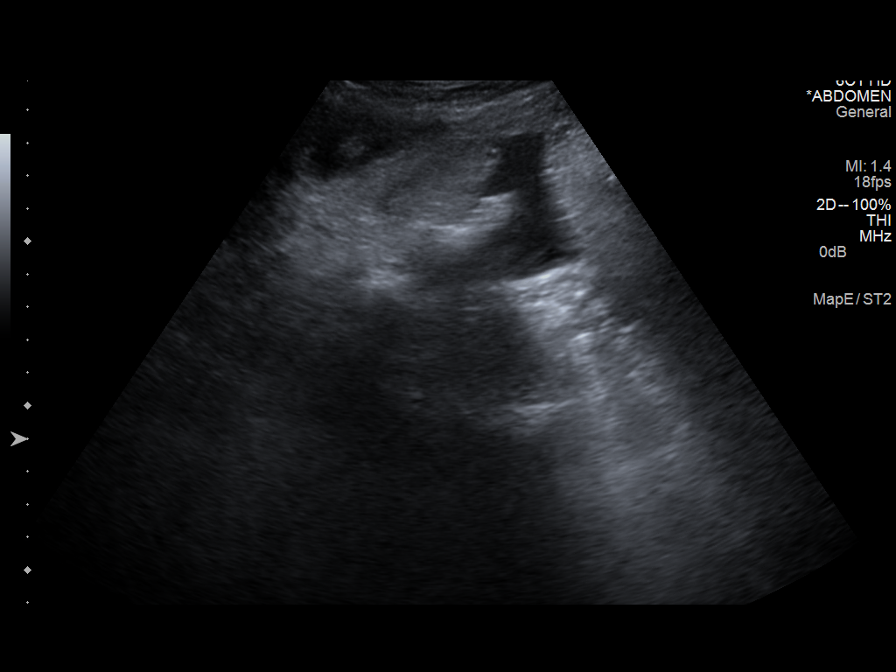
[im 6/8]
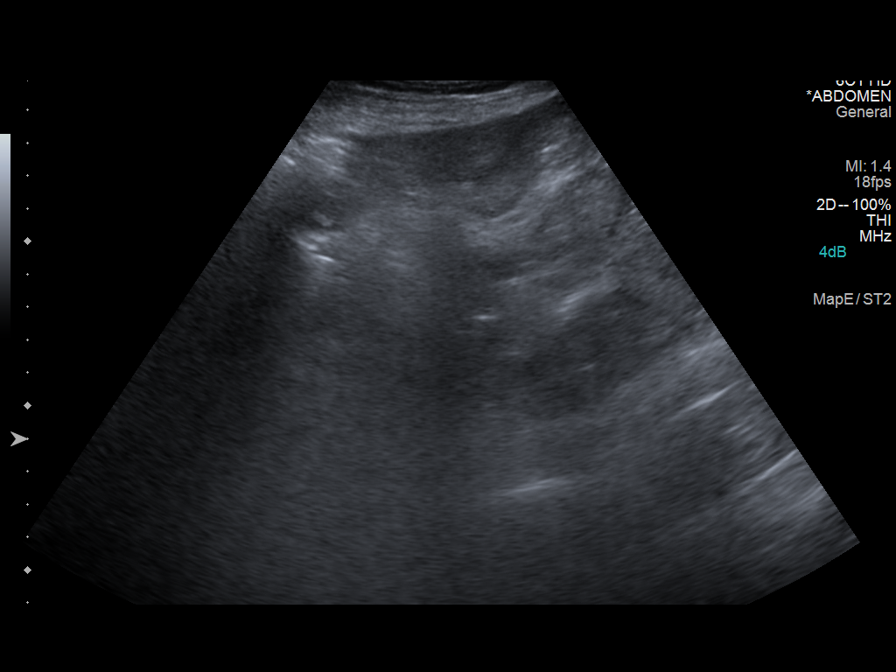
[im 7/8]
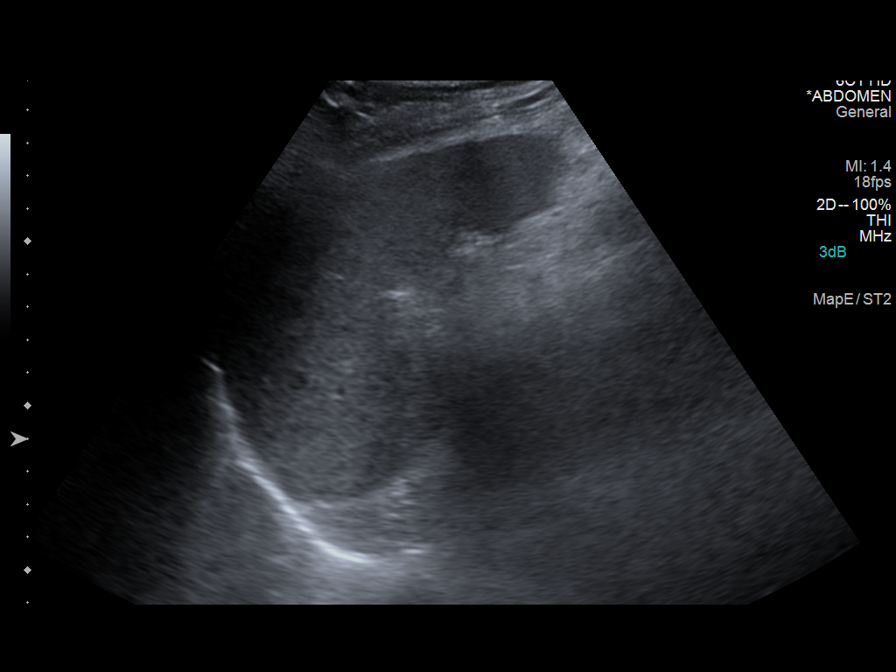
[im 8/8]
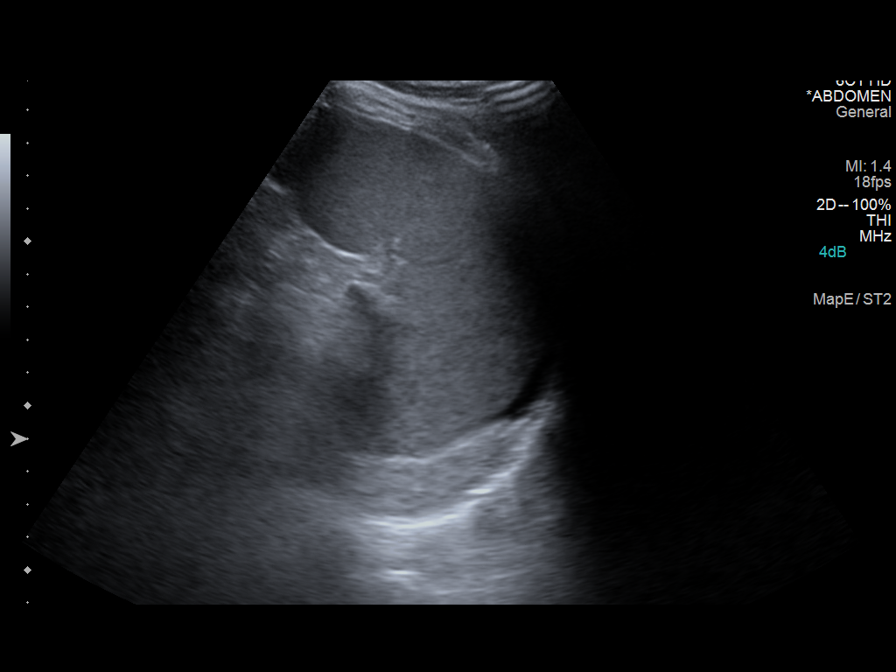

[8 of 8 positions shown; findings below may reference images not displayed]

FINDINGS: Small volume of free fluid in the left upper and lower quadrants,
quantity insufficient for paracentesis. No definite additional free
fluid.
IMPRESSION: Trace ascites, quantity insufficient for paracentesis.

## 2014-10-16 IMAGING — CR DG HIP W/ PELVIS BILAT
5 series · 5 of 5 positions shown · non-contrast
Comparison: None.

CLINICAL DATA: right hip and tailbone pain s/p fall

EXAM:
BILATERAL HIP WITH PELVIS - 4+ VIEW

[t pelvis ap]
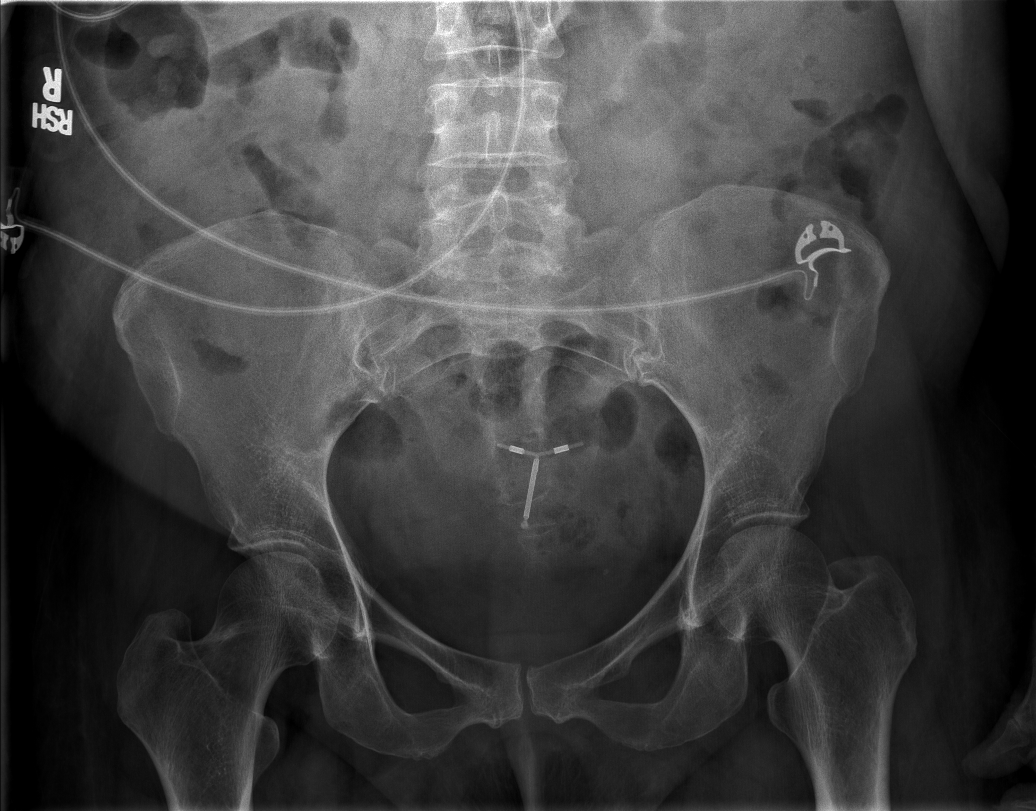

[t hip ap left]
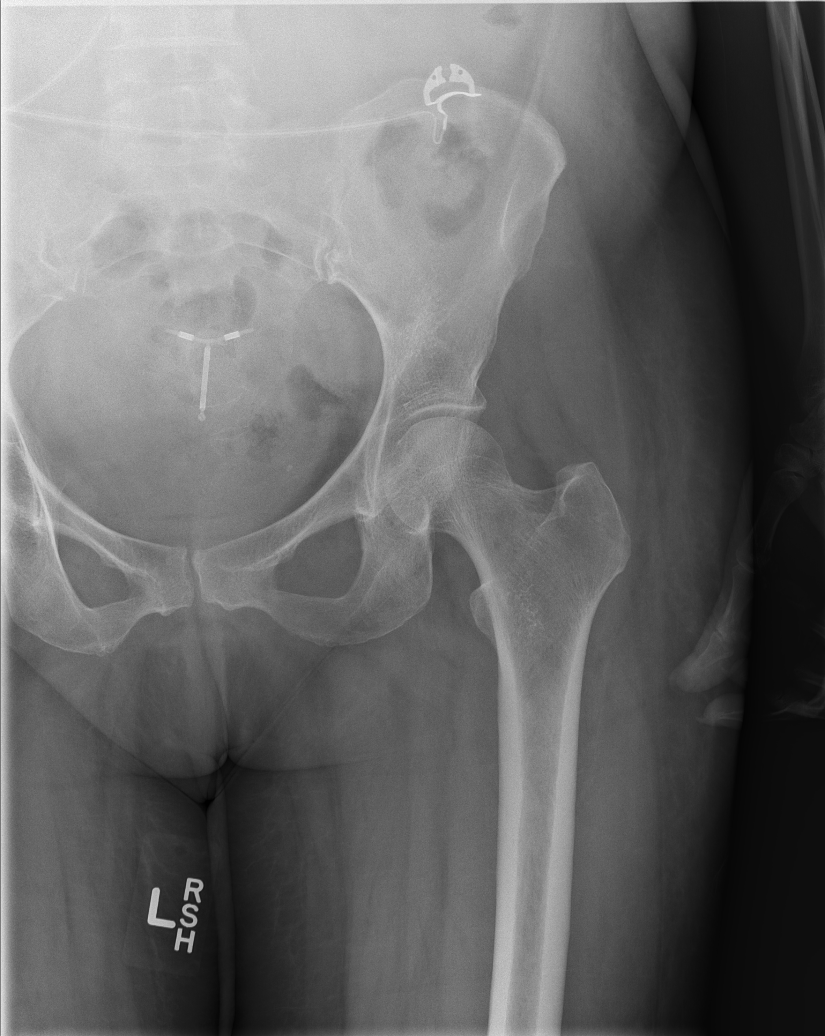

[t hip ap right]
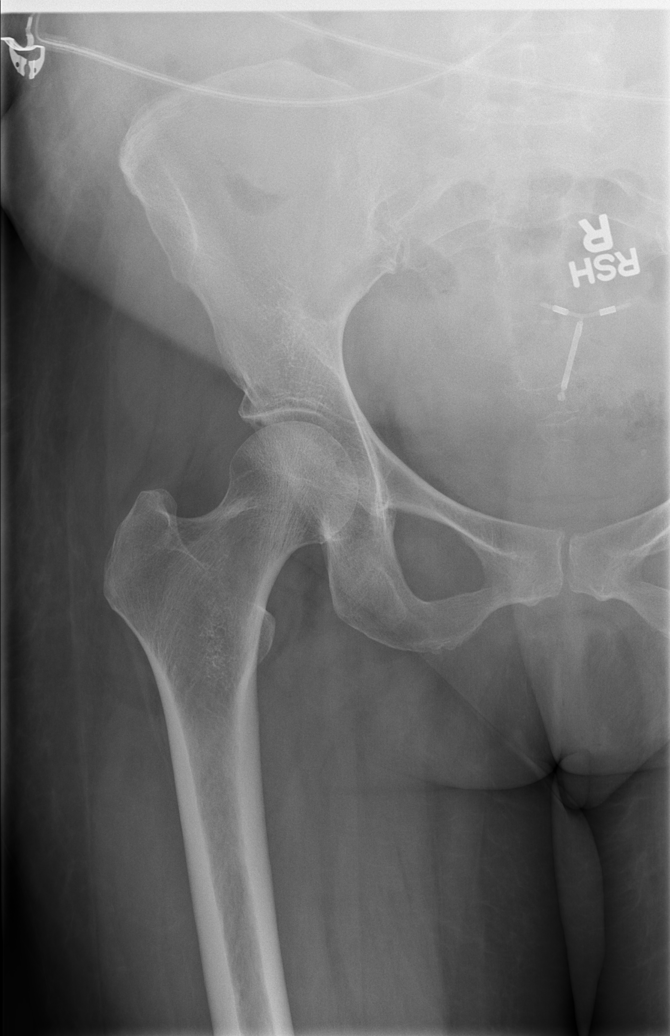

[t hip frog leg right]
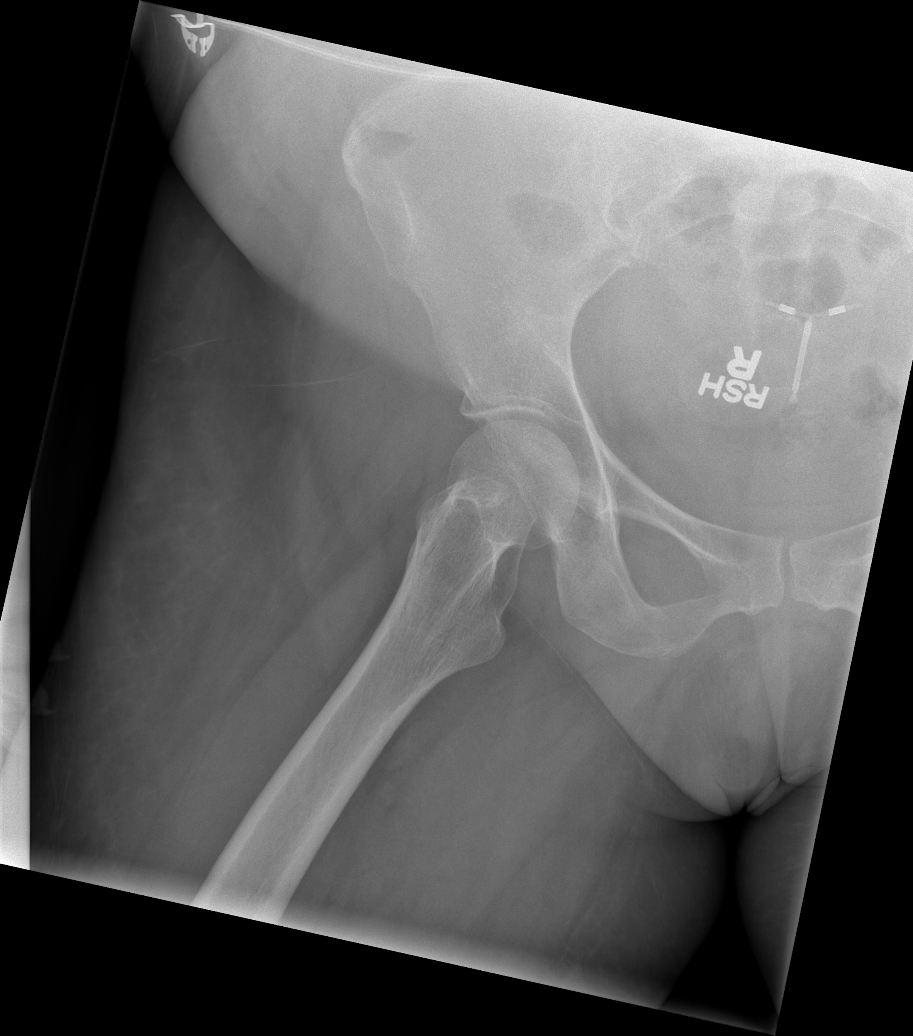

[t hip frog leg left]
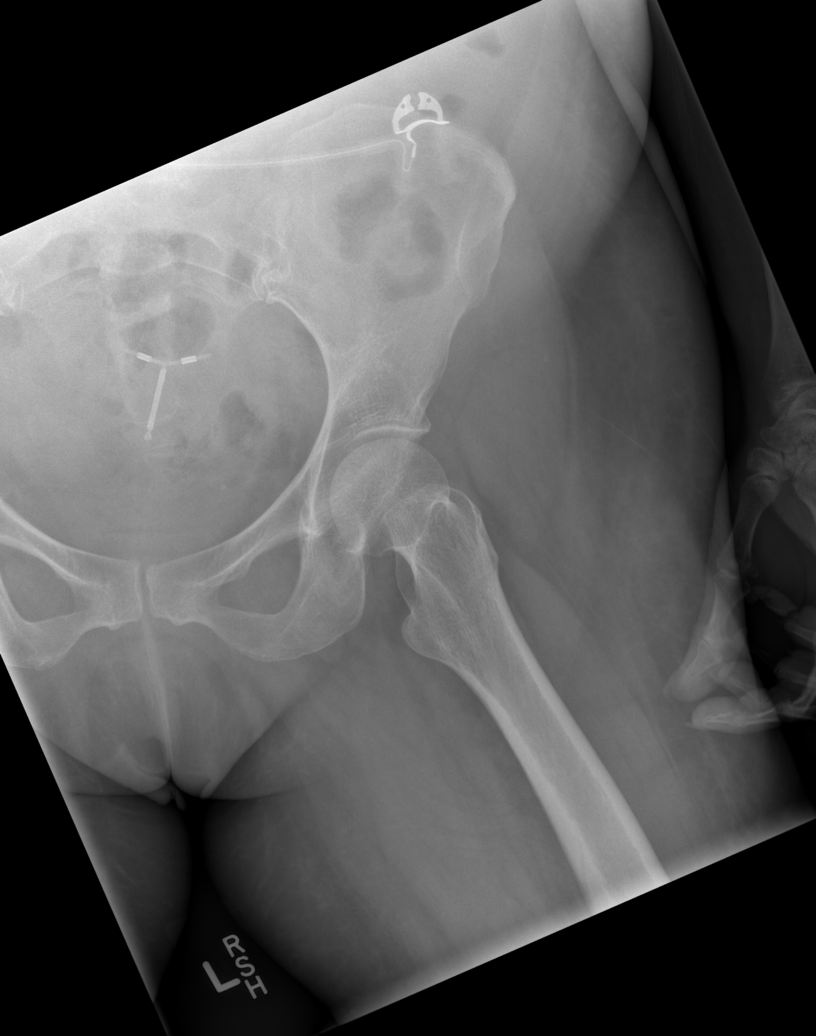

[5 of 5 positions shown; findings below may reference images not displayed]

FINDINGS: No acute bony abnormality. Specifically, no fracture, subluxation,
or dislocation. Soft tissues are intact.
IMPRESSION: No acute bony abnormality.

## 2015-01-31 IMAGING — CR DG CHEST 1V PORT
1 series · 1 of 1 positions shown · non-contrast
Comparison: 06/21/2013

CLINICAL DATA: Re-evaluate bilateral infiltrates

EXAM:
PORTABLE CHEST - 1 VIEW

[AP]
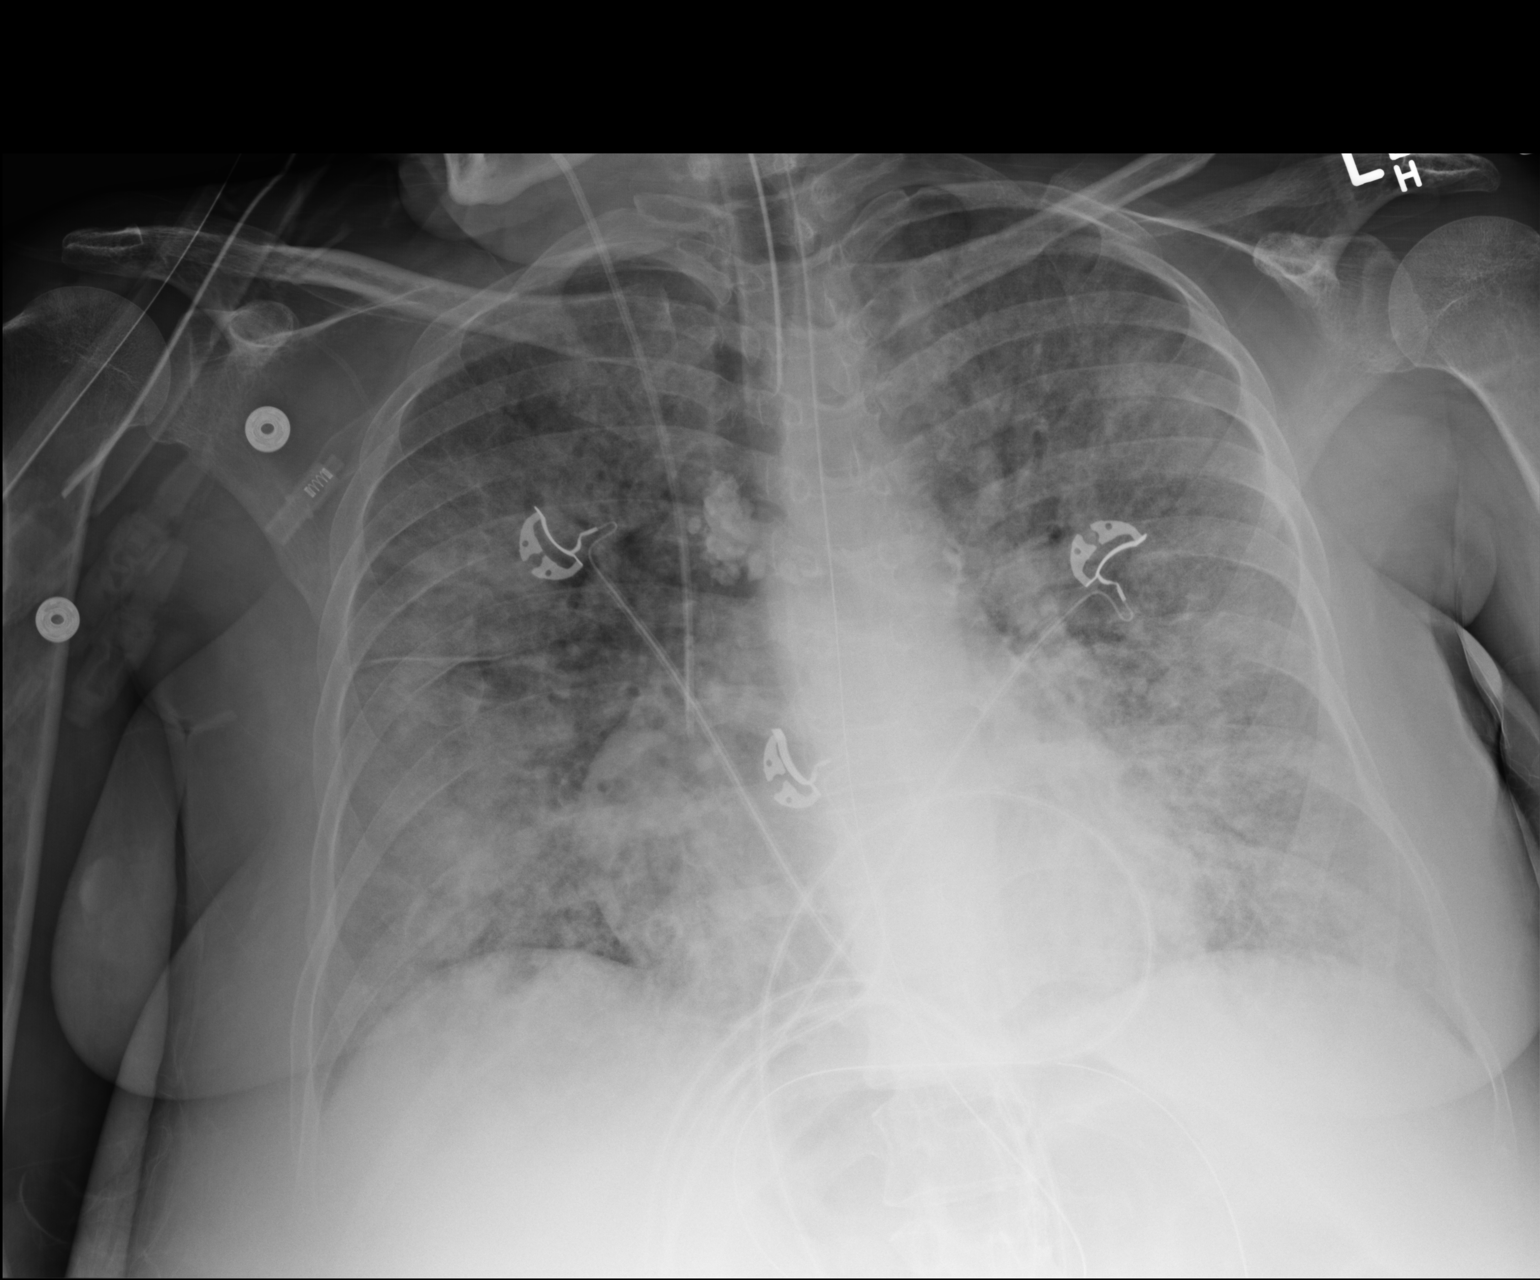

[1 of 1 positions shown; findings below may reference images not displayed]

FINDINGS: Mild cardiac enlargement stable. Lines and tubes in unchanged
position. Calcified right paratracheal lymph nodes stable. Severe
bilateral pulmonary parenchymal infiltrates unchanged. No pleural
effusions.
IMPRESSION: Given the severe persistent bilateral airspace opacities with out
pleural effusion, ARDS suspected.
